# Patient Record
Sex: Female | Born: 1940 | Hispanic: No | Marital: Married | Smoking: Never smoker
Health system: Southern US, Community
[De-identification: ages and names within clinical notes are randomized; demographics above are authoritative.]

## PROBLEM LIST (undated history)

## (undated) DIAGNOSIS — Z9889 Other specified postprocedural states: Secondary | ICD-10-CM

## (undated) DIAGNOSIS — T4145XA Adverse effect of unspecified anesthetic, initial encounter: Secondary | ICD-10-CM

## (undated) DIAGNOSIS — K219 Gastro-esophageal reflux disease without esophagitis: Secondary | ICD-10-CM

## (undated) DIAGNOSIS — R112 Nausea with vomiting, unspecified: Secondary | ICD-10-CM

## (undated) DIAGNOSIS — M549 Dorsalgia, unspecified: Secondary | ICD-10-CM

## (undated) DIAGNOSIS — H269 Unspecified cataract: Secondary | ICD-10-CM

## (undated) DIAGNOSIS — G8929 Other chronic pain: Secondary | ICD-10-CM

## (undated) DIAGNOSIS — R51 Headache: Secondary | ICD-10-CM

## (undated) DIAGNOSIS — M1990 Unspecified osteoarthritis, unspecified site: Secondary | ICD-10-CM

## (undated) DIAGNOSIS — T8859XA Other complications of anesthesia, initial encounter: Secondary | ICD-10-CM

## (undated) HISTORY — PX: HAND SURGERY: SHX662

## (undated) HISTORY — PX: FOOT SURGERY: SHX648

## (undated) HISTORY — PX: FRACTURE SURGERY: SHX138

## (undated) HISTORY — PX: EYE SURGERY: SHX253

---

## 1998-03-15 ENCOUNTER — Emergency Department (HOSPITAL_COMMUNITY): Admission: EM | Admit: 1998-03-15 | Discharge: 1998-03-15 | Payer: Self-pay | Admitting: Emergency Medicine

## 1998-07-19 ENCOUNTER — Emergency Department (HOSPITAL_COMMUNITY): Admission: EM | Admit: 1998-07-19 | Discharge: 1998-07-19 | Payer: Self-pay | Admitting: Emergency Medicine

## 1999-08-19 ENCOUNTER — Encounter: Admission: RE | Admit: 1999-08-19 | Discharge: 1999-08-19 | Payer: Self-pay | Admitting: *Deleted

## 1999-08-19 ENCOUNTER — Encounter: Payer: Self-pay | Admitting: *Deleted

## 1999-11-17 ENCOUNTER — Encounter: Payer: Self-pay | Admitting: Cardiovascular Disease

## 1999-11-17 ENCOUNTER — Encounter: Admission: RE | Admit: 1999-11-17 | Discharge: 1999-11-17 | Payer: Self-pay | Admitting: Cardiovascular Disease

## 1999-12-28 ENCOUNTER — Emergency Department (HOSPITAL_COMMUNITY): Admission: EM | Admit: 1999-12-28 | Discharge: 1999-12-28 | Payer: Self-pay | Admitting: Emergency Medicine

## 2000-01-11 ENCOUNTER — Encounter (HOSPITAL_COMMUNITY): Admission: RE | Admit: 2000-01-11 | Discharge: 2000-04-10 | Payer: Self-pay | Admitting: Rheumatology

## 2000-04-05 ENCOUNTER — Encounter: Admission: RE | Admit: 2000-04-05 | Discharge: 2000-04-05 | Payer: Self-pay | Admitting: Rheumatology

## 2000-04-05 ENCOUNTER — Encounter: Payer: Self-pay | Admitting: Rheumatology

## 2000-04-17 ENCOUNTER — Encounter: Payer: Self-pay | Admitting: Emergency Medicine

## 2000-04-17 ENCOUNTER — Emergency Department (HOSPITAL_COMMUNITY): Admission: EM | Admit: 2000-04-17 | Discharge: 2000-04-17 | Payer: Self-pay | Admitting: Emergency Medicine

## 2000-05-21 ENCOUNTER — Encounter: Payer: Self-pay | Admitting: Emergency Medicine

## 2000-05-21 ENCOUNTER — Emergency Department (HOSPITAL_COMMUNITY): Admission: EM | Admit: 2000-05-21 | Discharge: 2000-05-21 | Payer: Self-pay | Admitting: Emergency Medicine

## 2000-07-26 ENCOUNTER — Emergency Department (HOSPITAL_COMMUNITY): Admission: EM | Admit: 2000-07-26 | Discharge: 2000-07-26 | Payer: Self-pay | Admitting: Emergency Medicine

## 2000-07-29 ENCOUNTER — Ambulatory Visit (HOSPITAL_COMMUNITY): Admission: RE | Admit: 2000-07-29 | Discharge: 2000-07-29 | Payer: Self-pay | Admitting: Ophthalmology

## 2000-09-29 ENCOUNTER — Ambulatory Visit (HOSPITAL_COMMUNITY): Admission: RE | Admit: 2000-09-29 | Discharge: 2000-09-29 | Payer: Self-pay | Admitting: Ophthalmology

## 2001-10-26 ENCOUNTER — Encounter: Admission: RE | Admit: 2001-10-26 | Discharge: 2001-10-26 | Payer: Self-pay | Admitting: Cardiovascular Disease

## 2001-10-26 ENCOUNTER — Encounter: Payer: Self-pay | Admitting: Cardiovascular Disease

## 2001-11-28 ENCOUNTER — Ambulatory Visit (HOSPITAL_COMMUNITY): Admission: RE | Admit: 2001-11-28 | Discharge: 2001-11-28 | Payer: Self-pay | Admitting: Ophthalmology

## 2001-11-30 ENCOUNTER — Emergency Department (HOSPITAL_COMMUNITY): Admission: EM | Admit: 2001-11-30 | Discharge: 2001-11-30 | Payer: Self-pay | Admitting: Emergency Medicine

## 2001-11-30 ENCOUNTER — Encounter: Payer: Self-pay | Admitting: Emergency Medicine

## 2001-12-22 ENCOUNTER — Encounter: Payer: Self-pay | Admitting: Cardiovascular Disease

## 2001-12-22 ENCOUNTER — Encounter: Admission: RE | Admit: 2001-12-22 | Discharge: 2001-12-22 | Payer: Self-pay | Admitting: Cardiovascular Disease

## 2002-06-01 ENCOUNTER — Other Ambulatory Visit: Admission: RE | Admit: 2002-06-01 | Discharge: 2002-06-01 | Payer: Self-pay | Admitting: Obstetrics and Gynecology

## 2002-06-19 ENCOUNTER — Ambulatory Visit (HOSPITAL_COMMUNITY): Admission: RE | Admit: 2002-06-19 | Discharge: 2002-06-19 | Payer: Self-pay | Admitting: Obstetrics and Gynecology

## 2002-06-19 ENCOUNTER — Encounter: Payer: Self-pay | Admitting: Obstetrics and Gynecology

## 2002-10-26 ENCOUNTER — Emergency Department (HOSPITAL_COMMUNITY): Admission: EM | Admit: 2002-10-26 | Discharge: 2002-10-26 | Payer: Self-pay | Admitting: Emergency Medicine

## 2003-04-29 ENCOUNTER — Encounter: Payer: Self-pay | Admitting: Cardiovascular Disease

## 2003-04-29 ENCOUNTER — Encounter: Admission: RE | Admit: 2003-04-29 | Discharge: 2003-04-29 | Payer: Self-pay | Admitting: Cardiovascular Disease

## 2003-05-30 ENCOUNTER — Encounter: Payer: Self-pay | Admitting: Cardiovascular Disease

## 2003-05-30 ENCOUNTER — Encounter: Admission: RE | Admit: 2003-05-30 | Discharge: 2003-05-30 | Payer: Self-pay | Admitting: Cardiovascular Disease

## 2003-07-22 ENCOUNTER — Encounter: Payer: Self-pay | Admitting: Cardiovascular Disease

## 2003-07-22 ENCOUNTER — Encounter: Admission: RE | Admit: 2003-07-22 | Discharge: 2003-07-22 | Payer: Self-pay | Admitting: Cardiovascular Disease

## 2004-04-28 ENCOUNTER — Inpatient Hospital Stay (HOSPITAL_COMMUNITY): Admission: EM | Admit: 2004-04-28 | Discharge: 2004-04-30 | Payer: Self-pay | Admitting: Emergency Medicine

## 2004-07-28 ENCOUNTER — Encounter: Admission: RE | Admit: 2004-07-28 | Discharge: 2004-08-26 | Payer: Self-pay | Admitting: Cardiovascular Disease

## 2004-10-29 ENCOUNTER — Inpatient Hospital Stay (HOSPITAL_COMMUNITY): Admission: EM | Admit: 2004-10-29 | Discharge: 2004-11-11 | Payer: Self-pay | Admitting: Emergency Medicine

## 2004-11-11 ENCOUNTER — Inpatient Hospital Stay: Admission: RE | Admit: 2004-11-11 | Discharge: 2004-11-26 | Payer: Self-pay | Admitting: Cardiovascular Disease

## 2006-01-27 ENCOUNTER — Inpatient Hospital Stay (HOSPITAL_COMMUNITY): Admission: RE | Admit: 2006-01-27 | Discharge: 2006-01-30 | Payer: Self-pay | Admitting: Orthopedic Surgery

## 2006-04-11 ENCOUNTER — Emergency Department (HOSPITAL_COMMUNITY): Admission: EM | Admit: 2006-04-11 | Discharge: 2006-04-11 | Payer: Self-pay | Admitting: Emergency Medicine

## 2007-04-15 ENCOUNTER — Emergency Department (HOSPITAL_COMMUNITY): Admission: EM | Admit: 2007-04-15 | Discharge: 2007-04-15 | Payer: Self-pay | Admitting: Emergency Medicine

## 2007-07-11 ENCOUNTER — Encounter: Admission: RE | Admit: 2007-07-11 | Discharge: 2007-07-11 | Payer: Self-pay | Admitting: Cardiovascular Disease

## 2007-09-20 ENCOUNTER — Ambulatory Visit (HOSPITAL_BASED_OUTPATIENT_CLINIC_OR_DEPARTMENT_OTHER): Admission: RE | Admit: 2007-09-20 | Discharge: 2007-09-20 | Payer: Self-pay | Admitting: Orthopedic Surgery

## 2007-12-28 ENCOUNTER — Inpatient Hospital Stay (HOSPITAL_COMMUNITY): Admission: EM | Admit: 2007-12-28 | Discharge: 2008-01-05 | Payer: Self-pay | Admitting: Emergency Medicine

## 2008-01-01 ENCOUNTER — Ambulatory Visit: Payer: Self-pay | Admitting: Vascular Surgery

## 2008-01-01 ENCOUNTER — Encounter (INDEPENDENT_AMBULATORY_CARE_PROVIDER_SITE_OTHER): Payer: Self-pay | Admitting: Cardiovascular Disease

## 2008-01-31 ENCOUNTER — Encounter: Admission: RE | Admit: 2008-01-31 | Discharge: 2008-01-31 | Payer: Self-pay | Admitting: Cardiovascular Disease

## 2008-04-11 ENCOUNTER — Encounter: Admission: RE | Admit: 2008-04-11 | Discharge: 2008-04-11 | Payer: Self-pay | Admitting: Cardiovascular Disease

## 2008-07-04 ENCOUNTER — Encounter: Admission: RE | Admit: 2008-07-04 | Discharge: 2008-08-12 | Payer: Self-pay | Admitting: Cardiovascular Disease

## 2009-01-02 ENCOUNTER — Encounter: Admission: RE | Admit: 2009-01-02 | Discharge: 2009-03-06 | Payer: Self-pay | Admitting: Cardiovascular Disease

## 2009-04-06 IMAGING — CR DG CHEST 2V
2 series · 2 of 2 positions shown · non-contrast
Comparison: 07/11/07.

CLINICAL DATA: Fever/left chest and flank pain.
 CHEST - 2 VIEW:

[w chest pa]
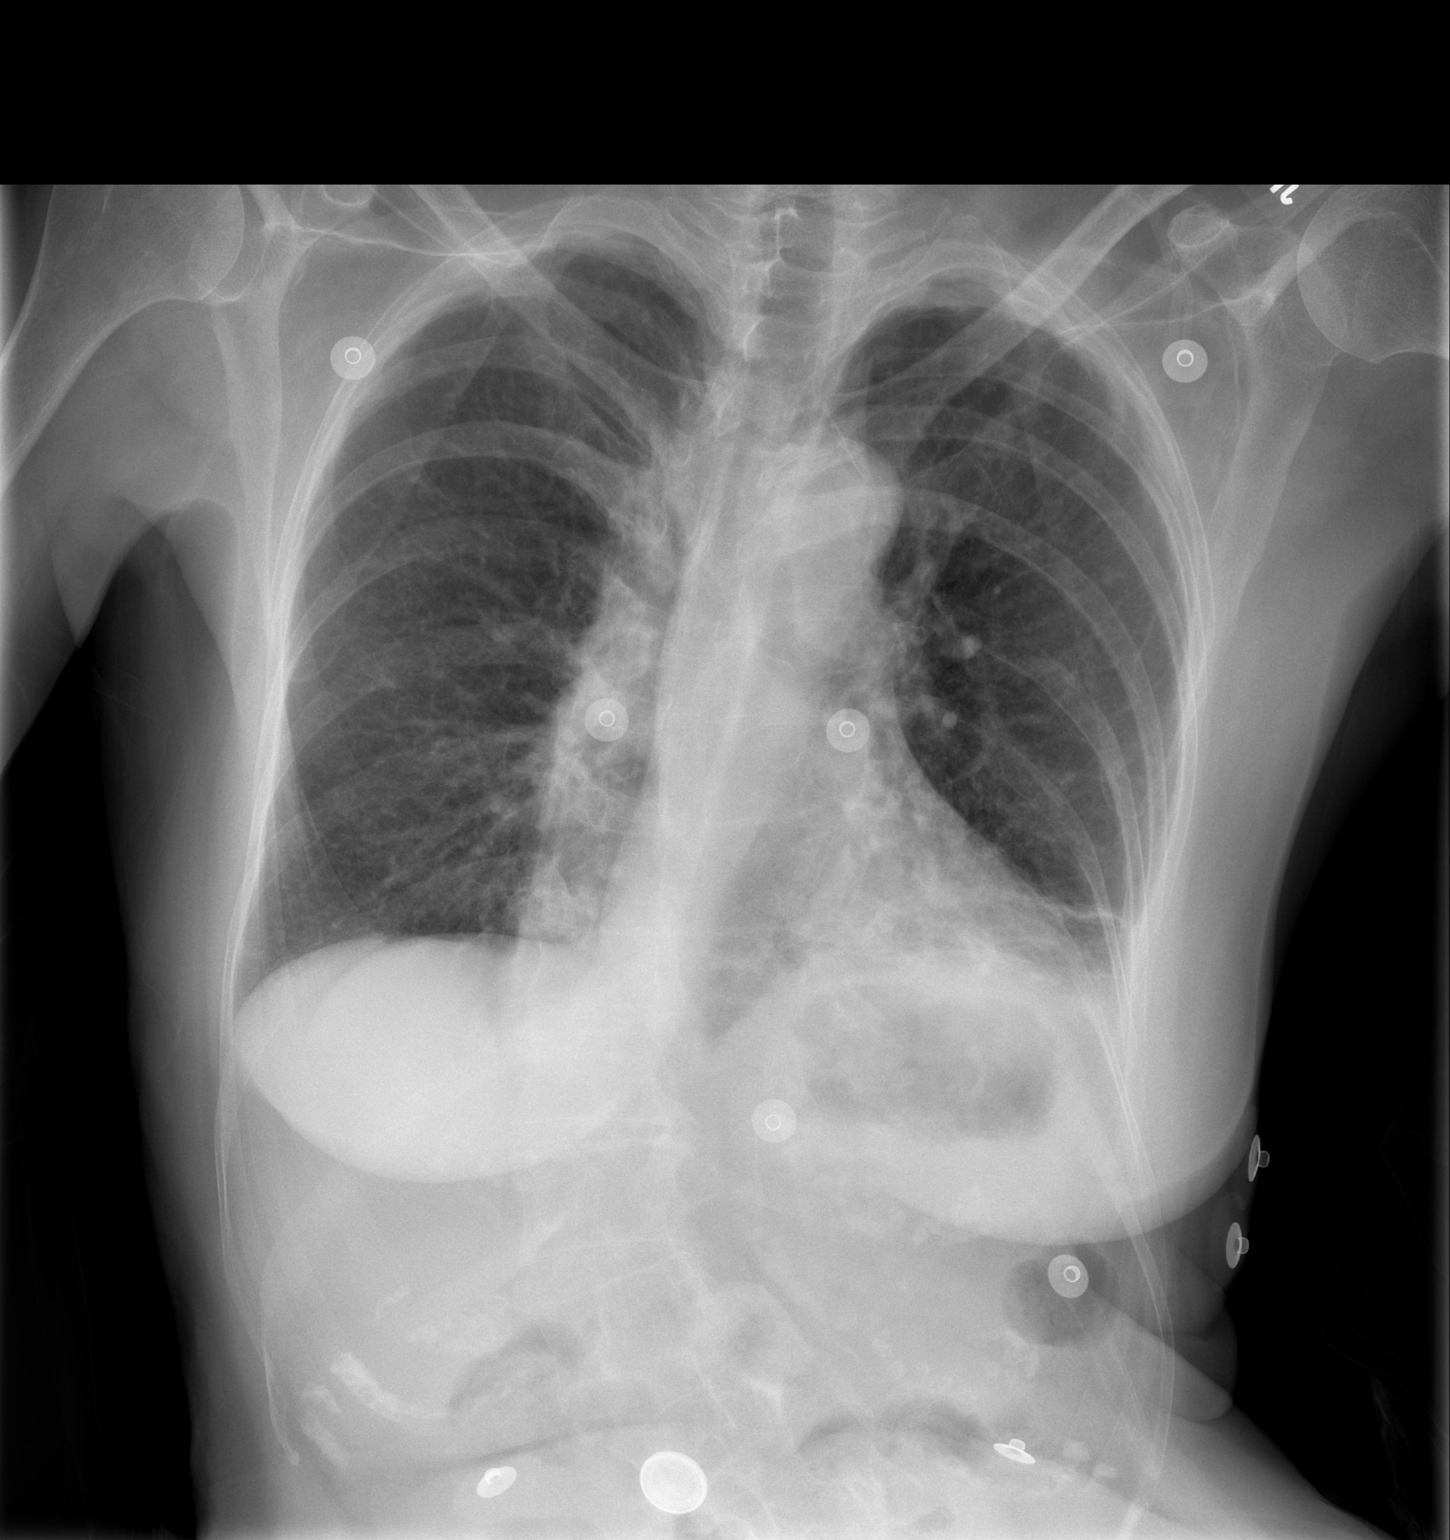

[w chest lat]
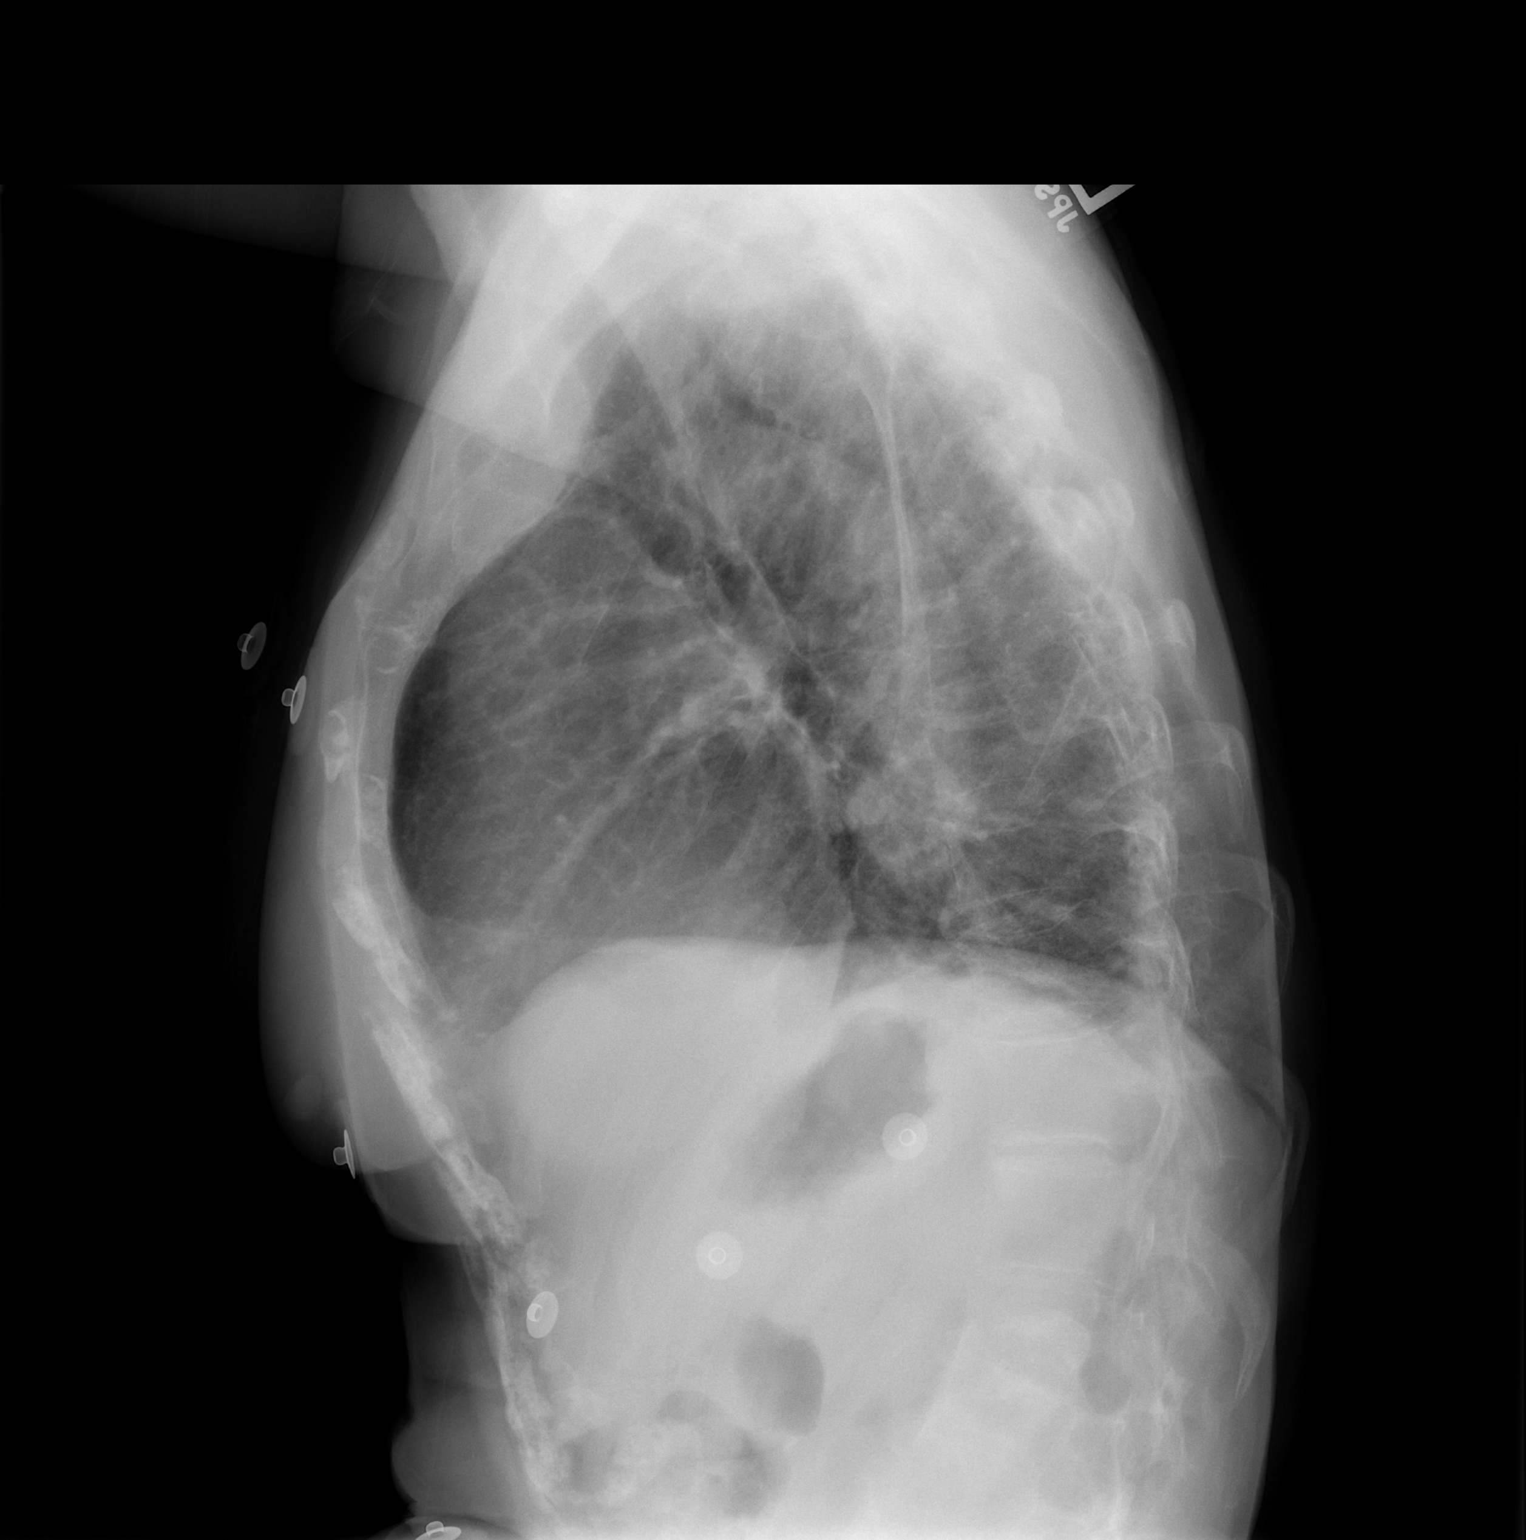

[2 of 2 positions shown; findings below may reference images not displayed]

FINDINGS: There is left lower lobe airspace disease, which is a new finding.  There is a probable small effusion.  The right lung is clear.
 There is biapical pleural thickening and underlying chronic lung disease.
 Bones are demineralized with marked dextroscoliosis.
IMPRESSION: New left lower lobe atelectasis or atelectatic pneumonia.

## 2009-05-22 ENCOUNTER — Inpatient Hospital Stay (HOSPITAL_COMMUNITY): Admission: RE | Admit: 2009-05-22 | Discharge: 2009-05-26 | Payer: Self-pay | Admitting: Orthopedic Surgery

## 2009-10-21 ENCOUNTER — Emergency Department (HOSPITAL_COMMUNITY): Admission: EM | Admit: 2009-10-21 | Discharge: 2009-10-21 | Payer: Self-pay | Admitting: Emergency Medicine

## 2009-11-13 ENCOUNTER — Inpatient Hospital Stay (HOSPITAL_COMMUNITY): Admission: RE | Admit: 2009-11-13 | Discharge: 2009-11-15 | Payer: Self-pay | Admitting: Orthopedic Surgery

## 2010-05-25 ENCOUNTER — Encounter: Admission: RE | Admit: 2010-05-25 | Discharge: 2010-05-25 | Payer: Self-pay | Admitting: Cardiovascular Disease

## 2010-11-21 ENCOUNTER — Encounter: Payer: Self-pay | Admitting: Neurology

## 2011-01-17 LAB — CBC
Hemoglobin: 10.6 g/dL — ABNORMAL LOW (ref 12.0–15.0)
Hemoglobin: 11.2 g/dL — ABNORMAL LOW (ref 12.0–15.0)
MCHC: 34.2 g/dL (ref 30.0–36.0)
MCHC: 34.4 g/dL (ref 30.0–36.0)
MCHC: 34.6 g/dL (ref 30.0–36.0)
MCV: 89.6 fL (ref 78.0–100.0)
Platelets: 191 10*3/uL (ref 150–400)
Platelets: 197 10*3/uL (ref 150–400)
RDW: 13.8 % (ref 11.5–15.5)
RDW: 13.9 % (ref 11.5–15.5)
RDW: 14 % (ref 11.5–15.5)

## 2011-01-17 LAB — BASIC METABOLIC PANEL
CO2: 26 mEq/L (ref 19–32)
CO2: 27 mEq/L (ref 19–32)
Calcium: 8.5 mg/dL (ref 8.4–10.5)
Calcium: 8.6 mg/dL (ref 8.4–10.5)
Chloride: 101 mEq/L (ref 96–112)
Creatinine, Ser: 0.79 mg/dL (ref 0.4–1.2)
Creatinine, Ser: 0.81 mg/dL (ref 0.4–1.2)
GFR calc non Af Amer: >60 mL/min (ref >60)
Glucose, Bld: 115 mg/dL — ABNORMAL HIGH (ref 70–99)
Glucose, Bld: 95 mg/dL (ref 70–99)
Sodium: 133 mEq/L — ABNORMAL LOW (ref 135–145)
Sodium: 136 mEq/L (ref 135–145)

## 2011-01-17 LAB — COMPREHENSIVE METABOLIC PANEL
ALT: 15 U/L (ref 0–35)
AST: 24 U/L (ref 0–37)
Calcium: 9.5 mg/dL (ref 8.4–10.5)
GFR calc Af Amer: >60 mL/min (ref >60)
Sodium: 135 mEq/L (ref 135–145)
Total Protein: 7.9 g/dL (ref 6.0–8.3)

## 2011-01-17 LAB — DIFFERENTIAL
Eosinophils Absolute: 0.2 10*3/uL (ref 0.0–0.7)
Eosinophils Relative: 2 % (ref 0–5)
Lymphocytes Relative: 30 % (ref 12–46)
Lymphs Abs: 2.9 10*3/uL (ref 0.7–4.0)
Monocytes Relative: 11 % (ref 3–12)

## 2011-02-07 LAB — CBC
HCT: 38.7 % (ref 36.0–46.0)
Hemoglobin: 13.4 g/dL (ref 12.0–15.0)
MCHC: 34.6 g/dL (ref 30.0–36.0)
MCV: 90.7 fL (ref 78.0–100.0)
Platelets: 210 10*3/uL (ref 150–400)
RBC: 4.27 MIL/uL (ref 3.87–5.11)
RDW: 13.9 % (ref 11.5–15.5)
WBC: 9.4 10*3/uL (ref 4.0–10.5)

## 2011-02-07 LAB — DIFFERENTIAL
Basophils Absolute: 0.2 10*3/uL — ABNORMAL HIGH (ref 0.0–0.1)
Lymphocytes Relative: 22 % (ref 12–46)
Monocytes Absolute: 0.8 10*3/uL (ref 0.1–1.0)
Monocytes Relative: 9 % (ref 3–12)
Neutro Abs: 6.3 10*3/uL (ref 1.7–7.7)

## 2011-02-07 LAB — COMPREHENSIVE METABOLIC PANEL
Albumin: 4.5 g/dL (ref 3.5–5.2)
BUN: 13 mg/dL (ref 6–23)
Creatinine, Ser: 0.71 mg/dL (ref 0.4–1.2)
Total Bilirubin: 1.3 mg/dL — ABNORMAL HIGH (ref 0.3–1.2)
Total Protein: 8.1 g/dL (ref 6.0–8.3)

## 2011-03-16 NOTE — H&P (Signed)
NAMEEVAN, OSBURN             ACCOUNT NO.:  [id number]   MEDICAL RECORD NO.:  [id number]          PATIENT TYPE:  INP   LOCATION:  4729                         FACILITY:  [facility]   PHYSICIAN:  Ricki Rodriguez, M.D.  DATE OF BIRTH:  02-15-1941   DATE OF ADMISSION:  12/28/2007  DATE OF DISCHARGE:                              HISTORY & PHYSICAL   CHIEF COMPLAINT:  Left-sided chest pain.   HISTORY OF PRESENT ILLNESS:  This 70 year old Asian American female had  a 3-day history of chest pain, along with mild fever without any  significant cough or sputum production.  The patient denied any history  of fall and there is no radiation of the chest pain or any sweating.   PAST MEDICAL HISTORY:  Negative for diabetes, hypertension, smoking,  alcohol intake, obesity or exercise.  The patient does not take any  street drugs.   FAMILY HISTORY:  Negative for any premature coronary artery disease.  Mom died in her 25s in 52.  Dad died in his 26s.  The patient has one  brother who died in 98 of a stroke; and one sister who died in 68 at  the age of 6, cause unknown.   CURRENT MEDICATIONS:  1. Metoprolol extended release 25 mg one daily.  2. Megestrol 40 mg one twice daily.  3. Prednisone 10 mg one daily as needed.  4. Vicodin 5/500 one to two times daily.  5. Flexeril 5 mg at bedtime.  6. Ferrous sulfate 325 mg daily.  7. Calcium 600 plus vitamin D daily.  8. Prilosec 20 mg one daily.   ALLERGIES:  NO KNOWN DRUG ALLERGIES.   PERSONAL HISTORY:  The patient is widowed.  Her husband died at age 75  of complications of aspiration pneumonia; he also had coronary artery  disease and stroke.  The patient has one son who is a 8 year old.   REVIEW OF SYSTEMS:  The patient has vision changes but does not wear  glasses.  No cataract surgery.  No hearing loss.  She does not wear  dentures.  No history of cough, asthma or pneumonia.  She does have  history of chest pain, rheumatoid  arthritis and headache following her  accident.  No history of bleeding from the bowel.  No history of stomach  ulcer, blood transfusion or hepatitis.  No history of kidney stones,  strokes, seizures or psychiatric admissions.   PHYSICAL EXAMINATION:  Pulse 90, respirations 20, blood pressure 110/60,  temperature 99, oxygen saturation 98%.  The patient is 5 feet 3 inches  tall; weighs approximately 85 pounds.  HEENT:  The patient is normocephalic, atraumatic; with mild right  temporal area tenderness.  She has lost her 2 front teeth.  Speech is  soft.  NECK:  No JVD.  Mild tenderness in the back of the neck.  LUNGS:  Few crackles at the left lung bases, otherwise clear.  HEART:  Normal S1 and S2, with a rated 2/6 systolic murmur.  ABDOMEN:  Soft and nontender.  EXTREMITIES:  No edema, cyanosis or clubbing.  She has bilateral  arthritic changes, more in  the left wrist.  Decreased mobility of the  right wrist post surgery.  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  CNS:  Cranial nerves grossly intact.  The patient has bilateral equal  grips, with weakness due to arthritis.   LABORATORY DATA:  Pending.   CHEST X-RAY:  Left lower lung pneumonia.   IMPRESSION:  1. Left lung pneumonia.  2. Rheumatoid arthritis.  3. Chronic headache.  4. Chronic back pain.   PLAN:  1. Admit the patient to regular floor.  2. Give IV antibiotics and analgesic for pain control.  3. Robitussin-DM as expectorant.      Ricki Rodriguez, M.D.  Electronically Signed     ASK/MEDQ  D:  12/28/2007  T:  12/29/2007  Job:  [id number]

## 2011-03-16 NOTE — Op Note (Signed)
NAMEBERLINDA, [name]             ACCOUNT NO.:  [id number]   MEDICAL RECORD NO.:  [id number]          PATIENT TYPE:  INP   LOCATION:  5006                         FACILITY:  [facility]   PHYSICIAN:  Dionne Ano. Gramig, M.D.DATE OF BIRTH:  06/05/1941   DATE OF PROCEDURE:  05/22/2009  DATE OF DISCHARGE:                               OPERATIVE REPORT   PREOPERATIVE DIAGNOSES:  1. Rheumatoid deformity with synovitis and end-stage destruction about      the thumb, metacarpophalangeal and carpometacarpal joints with      chronic deformity and inability to perform normal and customary      activities.  2. Rheumatoid arthritis with wrist deformity and failure to return to      quiescence despite numerous conservative measures including      injections.  She has synovitis and end-stage distal radioulnar      joint degenerative changes.   SURGICAL PROCEDURE PERFORMED:  1. Darrach procedure (resection of distal ulna, left wrist).  2. The extensor carpi ulnaris  tendon transfer to the left ulna.  This      was a tendon transfer at the wrist level.  3. Pronator quadratus tendon transfer to the ulna.  This was tendon      transfer at the wrist level.  4. The metacarpophalangeal fusion, left thumb at the      metacarpophalangeal joint.  5. Stress radiography.  6. Arthroplasty carpometacarpal joint, basilar thumb (removal of the      trapezium at the basilar thumb joint level) left basilar thumb.  7. Abductor pollicis longus, digastric portion tendon transfer to the      first metacarpal flexor carpi radialis and back upon themselves      with multiple figure-of-eight throws (Zancolli arthroplasty).  8. Abductor pollicis longus one-third proper portion tendon transfer      to the flexor carpi radialis, abductor pollicis longus proper and      back upon itself with multiple figure-of-eight throws (Weilby      tendon transfer) left basilar thumb joint.  9. Abductor pollicis longus tenodesis  (shortening of the wrist      extensor at the wrist-forearm level to prevent dorsolateral escape      of the thumb metacarpal).   SURGEON:  Dionne Ano. [place], Md   ASSISTANT:  Karie Chimera, PA-C   COMPLICATIONS:  None.   ANESTHESIA:  General.   INDICATIONS FOR PROCEDURE:  This patient presents for chronic rheumatoid  arthritic deformity.  She understands the risks and benefits of the  surgery.  She is failed multiple injections, splinting, pharmacologics  and other measures with all preop discussion performed and conservative  measures failing.  She presents to the above-mentioned procedure.   OPERATIVE PROCEDURE IN DETAIL:  The patient was seen by myself and  Anesthesia, taken to the operating suite and underwent a smooth  induction of general anesthesia.  Time-out was called, tourniquet  applied, preop checklist accomplished and of course, the arm had been  marked preoperatively.  Once this was done, she is prepped and draped in  usual sterile fashion with Betadine scrub and paint about the  left upper  extremity.  Preoperative antibiotics were in her system through an IV  route and the operation commenced with a dorsal ulnar incision.  Dissection was carried down, ECU was identified, fifth and fourth dorsal  compartments were identified and fifth dorsal compartment had chronic  tenosynovitic reaction about it.  I opened the capsular joint, resected  the distal ulna with oscillating saw.  I then beveled this down, so that  there will be no sharp edges.  Following this, I then performed drill  hole creation dorsally.  I harvested one-half slip of the ECU and placed  it through the end of the bone, up through drill hole and back upon  itself with multiple figure-of-eight throws and tied down against the  ECU proper.  She tolerated this well.  This completed the ECU tendon  transfer after Darrach procedure.   Once this was done, I then performed a pronator quadratus tendon   transfer to the dorsal periosteal tissue of the ulna and imbricated the  soft tissue.  This completed the pronator quadratus tendon transfer.  I  then performed a reefing procedure and oversewned the capsule  investments.  I took care to avoid the ulnar neurovascular bundles and  the fourth dorsal compartment and fifth dorsal compartment had chronic  tendinosis and poor competency.   Following this, I turned attention towards thumb.  A longitudinal  incision was made.  Dissection was carried down.  EPB and the sheath  over the MP joint was incised.  Capsule was then incised, joint was open  in cup-and-cone, collateral ligaments released, neurovascular bundles  protected and were out of harm's way.  Cup-and-cone preparation of the  joint with rongeur was accomplished followed by placement of 24-mm  Acutrak screw.  This was done in direct vision as well as with the aid  of fluoroscopy.  Coaptation of cancellous surfaces under compression was  accomplished without difficulty and the patient tolerated this well.  Following this, I then turned attention towards [facility] joint of thumb.   CMC joint was accessed.  Radial artery was very close to the area, was  chronic horrific deformity and I thus mobilized the radial artery  without difficulty as well as superficial radial nerve.  I then created  an interval between the capsule and opened this up.  Trapezium was  excised in piecemeal.  FCR underwent tenolysis and tenosynovectomy and  drill hole was created, dorsal to palmar exiting intra-articularly in  line with the palmar beak ligament.  Following this, I irrigated  copiously.  I then performed a harvesting of the APL digastric and one-  third proper portion of the APL proper tendon.   This completed the arthroplasty portion of procedure.  I then performed  tendon transfer of the APL digastric portion.  This was placed through  drill hole, dorsal to palmar around the FCR twice back upon itself  and  sutured down with FiberWire suture.  The patient tolerated this well.  Following this, I then performed irrigation and additional suture into  the periosteal tissue.  This completed the Zancolli tendon transfer.   Following this, I then performed APL one-third proper portion tendon  transfer to the FCR.  This was placed back on the APL proper in multiple  figure-of-eight throws were then placed securing the tendon transfer.  This completed the [facility] tendon transfer.  Thus, CMC arthroplasty and 2  tendon transfers was accomplished.  I then placed Gelfoam in the wound,  irrigated copiously,  and closed the wound with complicated capsular  stitch with APL tenodesis.  This was shortening of the wrist extensor at  the wrist-forearm level to prevent dorsolateral escape.  The patient  tolerated this well and there were no complicating features.  Following  this, tourniquet was deflated.  Hemostasis obtained with bipolar  cautery.  Capsules closed nicely and I was placed this.  Once this was  done, I irrigated without difficulty and closed wound with interrupted  Prolene.  The patient tolerated this nicely.  She was placed a long-arm  splint with forearm neutral, elbow at 90, wrist in neutral, and thumb in  a healthy position.  I was very pleased with the operative procedure.  This hopefully will give her gait gain, so she certainly does not have a  normal wrist due to  the tremendous deformity, but hopefully if we can increase her function,  this was our main goal.  She will be admitted for IV antibiotics, which  will begin in the recovery room.  Pain management according to her needs  and general postop observatory care.  All questions have been encouraged  answered.      Dionne Ano. Amanda Pea, M.D.  Electronically Signed     WMG/MEDQ  D:  05/22/2009  T:  05/23/2009  Job:  [id number]   cc:   Ricki Rodriguez, M.D.  Richard Watt Climes

## 2011-03-16 NOTE — Discharge Summary (Signed)
[name], [name]             ACCOUNT NO.:  [id number]   MEDICAL RECORD NO.:  [id number]          PATIENT TYPE:  INP   LOCATION:  4729                         FACILITY:  [facility]   PHYSICIAN:  Ricki Rodriguez, M.D.  DATE OF BIRTH:  26-Mar-1941   DATE OF ADMISSION:  12/28/2007  DATE OF DISCHARGE:  01/05/2008                               DISCHARGE SUMMARY   DISCHARGE DIAGNOSES:  1. Pulmonary embolism.  2. Bilateral deep venous thrombosis.  3. Possible pneumonia.  4. Rheumatoid arthritis.  5. Protein calorie malnutrition, mild.   DISCHARGE MEDICATIONS:  1. Flexeril 5 mg at bedtime.  2. Megace 40 mg one twice daily.  3. Toprol XL 25 mg one daily.  4. Vicodin 5/500 one twice daily as needed for pain.  5. Coumadin 5 mg one in the evening.  6. Ensure 8 ounces daily.  7. Albuterol metered-dose inhaler 2 puffs four times daily as needed.  8. Fexofenadine 180 mg one daily.  9. Prednisone 10 mg half a tablet daily.  10.Omeprazole 20 mg one daily.   ACTIVITY:  The patient to increase activity slowly and must use a walker  for ambulation.   DISCHARGE DIET:  Low-sodium, heart-healthy diet with no green, leafy  vegetable or broccoli.   SPECIAL INSTRUCTION:  The patient to stop any activity that causes chest  pain, shortness of breath, dizziness, sweating or excessive weakness.   FOLLOWUP:  Dr. Orpah Cobb in 1 week.  The patient to call [phone],  for appointment and get INR done also in 1 week.   HISTORY OF PRESENT ILLNESS:  This 70 year old, Asian-American female  presented with chest pain without fever or cough x3 days' duration.  The  patient had an x-ray done in the emergency room that appeared to show  left lung pneumonia.   PHYSICAL EXAMINATION:  VITAL SIGNS:  Pulse 110, respirations 16,  temperature 100.8.  Subsequent temperature 98.1.  Oxygen saturation 98%.  HEENT:  The patient is normocephalic, atraumatic with chronic right-  sided, frontoparietal tenderness following  injury caused by fall.  Her  eyes are brown.  Pupils reacting to light.  Extraocular movement intact.  NECK:  No JVD, no carotid bruit.  LUNGS:  Few crackles at both bases, more on the left side.  HEART:  Normal S1, S2 without S3 gallop.  ABDOMEN:  Soft and nontender.  EXTREMITIES:  No edema, cyanosis, clubbing.  Significant arthritic  changes in both upper and lower extremities.  NEUROLOGIC:  Cranial nerves grossly intact.  The patient moves all four  extremities.   LABORATORY DATA AND X-RAY FINDINGS:  WBC count of 12,600 with hemoglobin  11.7, hematocrit 34.7, platelet count to 264,000.  Sodium 132, potassium  4.4, chloride 102, glucose 106.  Cardiac enzymes negative.   X-ray of the chest suggestive of left lung pneumonia.  CT of the chest  revealed bilateral pulmonary emboli with a focal consolidation of the  left lower lobe and a small left pleural effusion.  EKG showed sinus  tachycardia with short PR.   HOSPITAL COURSE:  The patient was admitted to telemetry unit.  She was  started on IV antibiotic.  Her condition marginally improved in 24  hours.  She underwent CT of the chest that showed pulmonary emboli.  Evaluation of the lower extremities also showed deep venous thrombosis.  Hence, she was started on IV heparin and Coumadin.  Her condition  gradually improved over 72-96 hours.  The Coumadin level was 2.2 INR  today and the patient started ambulating as of yesterday.  Hence, she  was discharged home in satisfactory condition with a followup by me in 1  week and adjusting her medication doses prior to discharge.      Ricki Rodriguez, M.D.  Electronically Signed     ASK/MEDQ  D:  01/05/2008  T:  01/06/2008  Job:  [id number]

## 2011-03-16 NOTE — Discharge Summary (Signed)
NAMEKEILIN, [name]             ACCOUNT NO.:  [id number]   MEDICAL RECORD NO.:  [id number]          PATIENT TYPE:  INP   LOCATION:  5006                         FACILITY:  [facility]   PHYSICIAN:  Dionne Ano. Gramig, M.D.DATE OF BIRTH:  01-20-1941   DATE OF ADMISSION:  05/22/2009  DATE OF DISCHARGE:                               DISCHARGE SUMMARY   HISTORY OF PRESENT ILLNESS:  [name] is a very pleasant 70 year old  female who underwent rheumatoid reconstruction of her left hand.  She  was taken to the operative theater on May 22, 2009 and underwent there  procedure with tendon transfer and thumb reconstruction.  She was  admitted postoperatively.  She has a history of prior right upper  extremity reconstruction secondary to rheumatoid arthritis and has done  well.  The patient was admitted postoperatively and placed on IV  antibiotics.  General pain management was discontinued in terms of the  IV antibiotics on May 24, 2009 and weaned from IV pain medicine  throughout the course of her stay.  She was seen by therapy.  She  requires a walker for ambulatory purposes and had great difficulty given  the long arm cast that was present.  We discussed with her and her  family options.  Given her independent living status preoperatively and  the difficulty with independent living status postoperatively including  ability to make her own meals and ambulate with assistance, we discussed  with the family options for treatment.  At the present time, they  desired to proceed with skilled nursing facility for 2-6 weeks depending  on her progress.  I have discussed this with her son at great length who  is at cell phone [phone] or work [phone].  At the present  time, we are going to plan to advance her to skilled nursing facility.   PHYSICAL EXAMINATION:  LUNGS:  On May 25, 2009 showed clear lung  fields, although she has a history of PE.  She has had no complicating  features today  postoperatively.  Clear lung fields and looks excellent  and is ambulatory.  ABDOMEN:  Nontender.  HEART:  Regular rate.  EXTREMITIES:  Lower extremity examination is benign without signs of  DVT, infection, or dystrophy.  Left upper extremity shows good early  range of motion.  She had some attritional wear to the extensor  apparatus and difficulty with full extension of the extensor apparatus  but index finger and thumb are moving very well.  The patient is stable  at this juncture in my opinion.   DISCHARGE MEDICATIONS:  She is going to be discharged to the nursing  care facility on her regular medicines which included metoprolol 25 mg 1  p.o. b.i.d. and Megestrol 40 mg 1 p.o. b.i.d.  In addition to this, her  tobramycin drops 4 times daily will be continued.  We will plan for  continued omeprazole 20 mg 1 p.o. daily and fexofenadine 180 mg daily.  We will discharge her on Percocet 5 mg one to two q.4-6 h. p.r.n. pain  p.o. and Robaxin 500 mg 1 p.o. q.6-8 h. p.r.n.  spasm.   FOLLOWUP:  I want to see her back in my office in 10-14 days and asked  her to notify me should any problems occur.   FINAL DIAGNOSES:  1. Status post major rheumatoid reconstruction, left hand including      Darrach procedure as well as thumb reconstruction.  2. Rheumatoid arthritis.  3. History of hypertension.  4. Inability to assume independent living status postoperatively due      to the immobilization and her ambulatory care needs.   It has been an absolute pleasure to see her with abrupt chest pain in  her continued postoperative care.  I have discussed with [name] and  the family all the issues and they are aware of the do's and don't's  etc.      Dionne Ano. Amanda Pea, M.D.  Electronically Signed     WMG/MEDQ  D:  05/25/2009  T:  05/25/2009  Job:  [id number]

## 2011-03-16 NOTE — Op Note (Signed)
[name], [name]             ACCOUNT NO.:  [id number]   MEDICAL RECORD NO.:  [id number]          PATIENT TYPE:  AMB   LOCATION:  DSC                          FACILITY:  [facility]   PHYSICIAN:  Leonides Grills, M.D.     DATE OF BIRTH:  [birth year]   DATE OF PROCEDURE:  09/20/2007  DATE OF DISCHARGE:  09/20/2007                               OPERATIVE REPORT   PREOPERATIVE DIAGNOSIS:  1. Right great toe interphalangeal joint sesamoiditis.  2. Right fifth hammertoe.   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  2. Right fifth hammertoe.   OPERATIONS:  1. Right great toe interphalangeal joint sesamoid excision.  2. 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   ANESTHESIA:  General.   SURGEON:  Leonides Grills, MD   ASSISTANT:  Evlyn Kanner, P.A.   ESTIMATED BLOOD LOSS:  Minimal.   TOURNIQUET:  None.   COMPLICATIONS:  None.   DISPOSITION:  Stable to PR.   INDICATIONS:  This is a 70 year old female, who has had longstanding  plantar, right great toe pain with a persisting callus that was  resistant to conservative management as well as a cock-up right fifth  hammertoe that was rubbing in her shoes.  She was explained her options  for the fifth toe, being reconstruction versus amputation.  She wished  to proceed with amputation.  She was consented for the above procedure.  All risks which include infection, nerve or vessel injury, persistent  pain, worse pain, prolonged recovery, possible cock-up deformity through  the IP joint of the great toe were all explained.  Questions were  encouraged and answered.   OPERATION:  The patient brought to operating room and placed in supine  position after adequate general endotracheal tube anesthesia was  administered as well as Ancef gram IV piggyback.  The right lower  extremity was prepped and draped in a sterile manner over proximally  placed thigh tourniquet.  Limb was gravity  exsanguinated and the  tourniquet elevated 290 mmHg.  A longitudinal incision was made on the  plantar aspect of the right fifth toe IP joint.  Dissection was carried  down through skin.  Hemostasis was obtained.  EHL were identified and  retracted out of harm's way.  Sesamoid was then identified and carefully  dissected out.  Once this was dissected out and removed, area was  copiously irrigated with normal saline.  Skin was closed with 4-0 nylon  stitch.  We then made a racket shaped incision around the base of the  right fifth toe.  Dissection was carried down through skin.  Hemostasis  was obtained.  Toe was then disarticulated at the MTP joint.  Tourniquet  deflated.  Hemostasis was obtained.  Subcu was closed with 3-0 Vicryl.  Skin was closed with 4-0 nylon.  Sterile dressing was applied.  Hard  sole shoe was applied.  Patient stable to PR.      Leonides Grills, M.D.  Electronically Signed     PB/MEDQ  D:  09/20/2007  T:  09/20/2007  Job:  [id number]

## 2011-03-19 NOTE — Discharge Summary (Signed)
NAMEIONIA, SCHEY             ACCOUNT NO.:  [id number]   MEDICAL RECORD NO.:  [id number]          PATIENT TYPE:  INP   LOCATION:  3003                         FACILITY:  [facility]   PHYSICIAN:  Dionne Ano. Gramig, M.D.DATE OF BIRTH:  Sep 09, 1941   DATE OF ADMISSION:  01/27/2006  DATE OF DISCHARGE:  01/30/2006                                 DISCHARGE SUMMARY   ADMISSION DIAGNOSES:  1.  Chronic right wrist pain secondary to rheumatoid arthritis.  2.  History of stable angina.  3.  History of anemia.  4.  History of malnutrition.   DISCHARGE DIAGNOSES:  1.  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  2.  History of stable angina.  3.  History of anemia.  4.  History of malnutrition.   CONSULTATIONS:  Nutrition/dietary.   PROCEDURES:  1.  Total wrist fusion, right wrist, with Symphony bone grafting and      autograft bone grafting.  2.  Posterior interosseous neurectomy, left right hand.  3.  Extensor pollicis longus transposition, right hand.  4.  Stress x-ray.  5.  Day-of-procedure distal ulnar resection.  6.  Extensor carpi ulnaris to ulna 1/2 slip tendon transfer.   HISTORY OF PRESENT ILLNESS:  [name] is an extremely pleasant 70 year old  female who has a significant history of rheumatoid arthritis and, despite a  full conservative treatment regimen longstanding, she unfortunately persists  to have chronic pain which is disabling, and at this juncture she presents  for surgical intervention to the right upper extremity including a total  wrist fusion.  Preoperative medical issues, including angina and anemia,  were discussed with her primary care physician, Dr. Algie Coffer, and she was  found stable to undergo surgical intervention.  Her preoperative laboratory  data showed a hemoglobin and hematocrit of 11.5 and 32.6, respectively,  otherwise negative.  Preoperative EKG showed no gross changes to prior EKGs  and showed that she had sinus rhythm  with a short PR interval.   HOSPITAL COURSE:  [name] was admitted on January 27, 2006, and underwent  the above procedure without difficulty.  She tolerated this well. There were  no complications.  Please see the operative report for full details. She was  admitted postoperatively with standard pain management and an antibiotic  regimen.  Given her prior history of malnutrition and anemia as well as her  vegetarian status, nutrition was consulted to follow and ensure that she  obtained adequate sustenance.   On postoperative day #1, she was doing well overall.  Her pain was  relatively controlled.  She denied nausea, vomiting, fever, chills, or  shortness of breath.  Vital signs were stable.  She was afebrile.  O2  saturation on room air was 96%.  Hemoglobin and hematocrit were noted to be  10.1 and 29.7, respectively.  Glucose 123, albumin 3.3.  Her splint was  clean, dry, and intact to the upper extremity.  Bandage was removed without  difficulty.  Sensation and refill were intact.  Minimal edema was noted.  Decision for continued IV antibiotics was made given her rheumatoid status  and the increased propensity for infection in this patient population.  Decision was made to discontinue IV morphine and being a Dilaudid PCA given  her English was somewhat limited, and her ability to express her desires for  more pain medication is also somewhat limited.  She tolerated the PCA very  well, having no difficulties.  OT and PT were consulted and evaluated her.  She was noted to have minimal help at home and would be a good candidate for  home health care given her overall health status and recent surgery.   On postoperative day #2, she was doing well; she was afebrile.  She remained  stable to the upper extremity.  Laboratory data remained stable, and overall  she looked good.   On postoperative day #3, she was tolerating p.o.'s well.  Ensure was placed  at bedside as a supplement to her  diet.  Vital signs stable.  She was  afebrile.  Her upper extremity revealed that she was neurovascularly intact.  Her splint was clean, dry, and intact.  Lungs were clear to auscultation.  Abdomen was soft, nontender.  Heart was regular rate and rhythm.  Decision  was made to discharge her home.  Care management had seen and evaluated the  patient and had [facility] set up for her at her discharge.   ASSESSMENT AND FINAL DIAGNOSES:  Please see Discharge Diagnoses and Plan.   CONDITION ON DISCHARGE:  Improved.   DIET:  She takes a vegetarian diet, but will utilize supplements as needed  as discussed with dietary.   ACTIVITY:  She will keep her dressing clean, dry, and intact to the upper  extremity, move her fingers frequently and elevate frequently.   DISCHARGE MEDICATIONS:  1.  Peri-Colace over-the-counter 100 mg 1 p.o. twice daily.  2.  Percocet 5/325 one to two p.o. q. 4-6 h p.r.n. pain.  3.  In addition, on day of discharge she was given Dulcolax suppository and      use milk of magnesia p.r.n.   She will return to the clinic and follow up with Dr. Amanda Pea April 9 at 11  a.m.      Karie Chimera, P.A.-C.    ______________________________  Dionne Ano. Amanda Pea, M.D.    BB/MEDQ  D:  03/10/2006  T:  03/11/2006  Job:  [id number]

## 2011-03-19 NOTE — Op Note (Signed)
[place]. [facility]  Patient:    [name], [name]                    MRN: [id number] Proc. Date: 09/29/00 Adm. Date:  [id number] Attending:  Karenann Cai                           Operative Report  PREOPERATIVE DIAGNOSIS:  Immature cataract, left eye.  POSTOPERATIVE DIAGNOSIS:  Immature cataract, left eye.  PROCEDURE:  Extracapsular cataract extraction with intraocular lens implantation.  ANESTHESIA:  Local using Xylocaine 2% with Marcaine 0.75% and Wydase.  JUSTIFICATION FOR PROCEDURE:  This is a 70 year old lady, who underwent a cataract extraction from the right eye with good visual results several months ago.  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  She has a visual acuity in the right eye best corrected at 20/50, on the left eye 20/60.  Cataract extraction with intraocular lens implantation was recommended.  She was admitted at this time for that purpose.  DESCRIPTION OF PROCEDURE:  The patient was taken to the operating room, where, under the influence of IV sedation, the Van Lint akinesia and retrobulbar anesthesia was given.  The patient was prepped and draped in the usual manner. The lid speculum was inserted under the upper and lower lid of the left eye and a 4-0 silk traction suture was passed through the belly of the superior rectus muscle for traction.  A fornix-based conjunctival flap was turned and hemostasis achieved by using cautery.  An incision made in the sclera approximately 1 mm posterior to the limbus.  This incision was dissected down into clear cornea using crescent blade.  A side port incision was made at the 1:30 oclock position and Occucoat was injected into the eye through the side port incision.  The anterior chamber was then entered through the corneoscleral tunnel incision at the 11:30 oclock position.  An anterior capsulotomy was done using  bent 25-gauge needle with a moderate amount of difficulty.  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  After difficulty manipulating the iris and the nucleus and getting the phaco hand piece into the eye, the decision was made to convert the procedure to an extracapsular procedure.  Therefore, the wound was extended first toward the left and then toward the right.  Patient had a single Vicryl suture across each ______ incision ______ toward the left and toward the right.  The nucleus was then manually expressed from the eye.  The two ______ stay sutures were closed and a third one was placed at the 12 oclock position. The ______ handpiece was fastened in the eye and the ______ was aspirated. During the cortical clean-up, it was noticed that we had a tear in the posterior capsule.  Vitreous presented itself in the wound.  An anterior vitrectomy was done.  An anterior chamber lens was then seated across the iris into the chamber angled without difficulty.  The anterior chamber was reformed and pupil was constricted using Miochol.  The corneoscleral wound was then closed by using a combination of interrupted sutures of 8-0 Vicryl and 10-0 nylon.  After ascertaining that the wound was airtight and watertight, the conjunctiva was closed using thermocautery.   One cc of Celestone, 0.5 cc of gentamicin were injected subconjunctivally.  Maxitrol ophthalmic ointment and atropine ointment were applied along with the patch  and Fox shield.  The patient tolerated the procedure well and was discharged to the postanesthesia recovery room in satisfactory condition.  She is instructed to refrain from any strenuous activity, such as stooping, bending, and lifting.  She is to see me in the office tomorrow for further evaluation.  DISCHARGE DIAGNOSIS:  Immature cataract - left eye. DD:  09/29/00 TD:  09/29/00 Job: [id number]

## 2011-03-19 NOTE — Discharge Summary (Signed)
[name], [name]             ACCOUNT NO.:  [id number]   MEDICAL RECORD NO.:  [id number]          PATIENT TYPE:  ORB   LOCATION:  4503                         FACILITY:  [facility]   PHYSICIAN:  Ricki Rodriguez, M.D.  DATE OF BIRTH:  [birth year]   DATE OF ADMISSION:  11/11/2004  DATE OF DISCHARGE:  11/26/2004                                 DISCHARGE SUMMARY   PRINCIPAL DIAGNOSES:  1.  Rehabilitation process.  2.  Fracture of the maxillary zygomatic bone.  3.  Unspecified fall.  4.  Extended home.  5.  Postsurgical status.  6.  Presently very mild enterocele.  7.  Urinary tract infection.  8.  Rheumatoid arthritis.  9.  Anemia.  10. Abnormal gait.  11. Long term use of steroids.   DISCHARGE MEDICATIONS:  1.  Folic acid 1 mg daily.  2.  Colace 100 mg daily.  3.  Megace 40 mg 2 twice daily.  4.  Allergies 180 mg 1 daily,  5.  Ferrous sulfate 325 mg 1 daily.  6.  Calcium 500 plus vitamin D 1 daily.  7.  Prednisone 10 mg daily.  8.  Toprol XL 25 mg 1 daily.  9.  Ensure 8 ounces once or twice daily.  10. Vicodin 5/500 1 twice daily as needed.  11. Naprosyn 500 mg 1 daily as needed.  12. Pain management; patient is to use Tylenol as directed.   ACTIVITY:  The patient most walk with a walker only.   DIET:  The discharge diet is a low-fat, regular salt diet.   SPECIAL INSTRUCTIONS:  The patient is discontinue the splint in one week.   FOLLOW UP:  The patient is to follow up with Dr. Ricki Rodriguez in two  weeks. The patient is to call [phone] for an appointment.   DISCHARGE CONDITION:  The condition on discharge is improved.   HISTORY:  This 70 year old Asian female fell at home without loss of  consciousness and had a nosebleed near the bathroom following a complaint of  complete body aches.  The patient denies any chest pain, loss of vision and  heart palpitations.  She sustained a fracture of the right-sided zygomatic  both maxillary bones requiring surgical  correction by Dr. Graylon [name] on  October 31, 2004.   PHYSICAL EXAMINATION:  VITAL SIGNS:  On physical examination the pulse is  94, respiratory rate 16, blood pressure 100/60, temperature 98, and oxygen  saturation 100% on room air.  The patient is 5 feet 3 inches tall and weighs  86 pounds.  HEENT:  The patient is normocephalic with a mild swelling of the left  maxilla, in the zygomatic area with two healing scars from surgery.  Speech  is soft.  The patient has lost two front teeth.  NECK:  No JVD.  Mild tenderness in the back of the neck.  LUNGS:  The lungs are clear bilaterally.  HEART:  The heart has normal S1 and S2.  ABDOMEN:  The abdomen is soft and nontender.  EXTREMITIES:  There are arthritic changes of the hands and feet with  swelling  at the right ankle area along with tenderness over the right  shoulder with limited abduction to 45 degrees,   LABORATORY DATA:  The laboratory data revealed a hemoglobin of 11.3,  hematocrit of 33.1and borderline high WBC count if 11,800; and, a subsequent  level was 8,400.  The platelet count was elevated at 572,000 and subsequent  platelet was about 258,000.  Her electrolytes were near normal with a  slightly low sodium of 132 and subsequent sodium was 138 mEq/liter.  Her  iron level was normal at 83.  Folate level was over 24.  Urinalysis showed  positive nitrites with moderate leukocyte esterase and over 100,000 colonies  of E coli.   HOSPITAL COURSE:  The patient was admitted to the rehab unit.  She had  inpatient consult, physical therapy and occupational therapy with  significant improvement in her condition over two weeks.  On November 12, 2004 her Foley catheter was removed.  She had no symptoms of a urinary tract  infection and was placed on Cipro 250 mg twice daily for five days.   On November 26, 2004 she was discharged home in satisfactory condition with  follow up by me in two weeks.      ASK/MEDQ  D:  02/24/2005  T:   02/25/2005  Job:  [id number]

## 2011-03-19 NOTE — Op Note (Signed)
[place]. [facility]  Patient:    BRIANNIE, [name]                    MRN: [id number] Adm. Date:  [id number] Disc. Date: [id number] Attending:  Karenann Cai                           Operative Report  PREOPERATIVE DIAGNOSIS:  Immature cataract, right eye.  POSTOPERATIVE DIAGNOSIS:  Immature cataract, right eye.  OPERATION:  Kelman phacoemulsification and cataract, right eye.  ANESTHESIA:  Local using Xylocaine 2% with Marcaine 0.75% with Wydase.  JUSTIFICATION FOR PROCEDURE:  This is a 70 year old [place] lady who complains of blurring of vision with difficulty seeing to read and see TV.  She was evaluated and found to have a visual acuity best corrected at 20/60 each eye. There were bilateral cataracts.  Cataract extraction of the right eye was recommended, and she was admitted to undergo this procedure.  DESCRIPTION OF PROCEDURE:  Under the influence of IV sedation, Van Lint akinesia, retrobulbar anesthesia were given.  The patient was prepped and draped in the usual manner.  The lid speculum was inserted under the upper and lower lid of the right eye, and a 4-0 silk traction suture was passed through the belly of the superior rectus muscle for retraction.  A fornix-based conjunctival flap was turned and hemostasis was achieved by using cautery.  An incision was made in the sclera approximately 1 mm posterior to the limbus. This incision was dissected down to clear cornea using the crescent blade.  A side port incision was made at the 1:30 oclock position.  Occucoat was injected into the eye through the side port incision.  The anterior chamber was then entered through the corneoscleral incision at the 11:30 oclock position.  An anterior capsulotomy was done using a bent 25 gauge needle.  The nucleus was hydrodissected using Xylocaine.  The KPE handpiece was placed into the eye, and the nucleus was emulsified without difficulty.  The  residual cortical material was aspirated.  The posterior capsule was polished using an olive tip polisher.  The wound was widened slightly to accommodate a 5 x 6 folded intraocular lens.  This lens was seated in the eye behind the iris without difficulty.  The anterior chamber was reformed, and the pupil was constricted using Miochol.  The corneoscleral wound was then closed using a single horizontal suture of 10-0 nylon.  Before closing the suture, the residual Occucoat was aspirated from the eye.  After it was ascertained that the wound was airtight and watertight, the conjunctiva was closed using thermocautery.  Celestone 1 cc and 0.5 cc of gentamicin were injected subconjunctivally.  Maxitrol ophthalmic ointment and Pilopine ointment were applied along with a patch and Fox shield.  The patient tolerated the procedure well and was discharged to the postanesthesia recovery room in satisfactory condition.  She is instructed to keep the eye patched today, to take Vicodin every four hours as needed for pain, and to see me in the office tomorrow for further evaluation. DD:  08/15/00 TD:  08/16/00 Job: [id number]

## 2011-03-19 NOTE — H&P (Signed)
[name], [name]             ACCOUNT NO.:  [id number]   MEDICAL RECORD NO.:  [id number]          PATIENT TYPE:  ORB   LOCATION:  NA                           FACILITY:  [facility]   PHYSICIAN:  Ricki Rodriguez, M.D.  DATE OF BIRTH:  1941/09/04   DATE OF ADMISSION:  11/11/2004  DATE OF DISCHARGE:                                HISTORY & PHYSICAL   CHIEF COMPLAINT:  Dizziness.   HISTORY OF PRESENT ILLNESS:  This 70 year old Asian-American female fell at  home, with a loss of consciousness and a nose bleed near the bathroom,  following a complaint of complete body aches.  The patient denies any chest  pain, no loss of vision, no bleeding from the bowel, no heart palpitations.  She sustained a fracture of the right-sided zygomatic and maxillary bones,  requiring surgical correction by Dr. Lovena Le, D.D.S., on October 31, 2004.   PAST MEDICAL HISTORY/SOCIAL HISTORY:  Negative for diabetes, hypertension,  smoking, alcohol intake, obesity, or exercise.  The patient also does not  take any street drugs.   FAMILY HISTORY:  No premature coronary artery disease in the family.  Mom  died in her 34's, in 58.  Dad died in his 78's.  She had one brother who  died in the 1970's, of a stroke.  One sister who died in 9, at age 50.   MEDICATIONS:  1.  Folic acid 1 mg daily.  2.  Docusate sodium 100 mg daily.  3.  Megace 80 mg b.i.d.  4.  Claritin 10 mg daily.  5.  Vicodin 5/500 mg q.i.d.  6.  Ferrous sulfate 325 mg, one in the evening.  7.  Calcium 600 mg, plus vitamin D, one daily in the morning.  8.  Prednisone 10 mg, one daily.  9.  Toprol XL 25 mg, one daily.   ALLERGIES:  No known drug allergies.   PERSONAL HISTORY:  The patient is married.  Her husband is 63 years old, who  has had coronary artery disease and a cerebrovascular accident.  The patient  has one son who is age 65.   REVIEW OF SYSTEMS:  The patient has a vision change but does not wear  glasses.  No  cataract.  No hearing loss.  Does not wear dentures.  No  history of a cough, asthma, or pneumonia.  She has a history of chest pain  and rheumatoid arthritis. No history of nausea, vomiting, diarrhea, or  constipation.  No history of bleeding from the bowel.  No history of a  stomach ulcer, blood transfusion, or hepatitis.  No history of kidney  stones.  No history of strokes, seizures, or psychiatric admissions.  No  history of a flu shot.   PHYSICAL EXAMINATION:  VITAL SIGNS:  Pulse 94, respirations 16, blood  pressure 100/60, temperature 98 degrees.  Oxygen saturation 100% on room  air.  The patient is 5 feet 3 inches.  She weighs 86 pounds.  HEENT:  The patient is normocephalic with mild swelling of the right  maxillary and zygomatic area with two healing scars of surgery.  Her speech  is soft.  She has lost two front upper teeth.  NECK:  No jugular venous distention.  Mild tenderness in the back of the  neck.  LUNGS:  Clear bilaterally.  HEART:  Normal S1, S2.  ABDOMEN:  Soft, nontender.  EXTREMITIES:  Arthritic changes in both hands and feet, with swelling at the  wrist and ankle areas.  There is also a tenderness over the right shoulder  with a limited abduction to 45 degrees.   ASSESSMENT:  1.  Right zygomatic bone/maxillary complex fracture, status post surgery.  2.  Dizziness.  3.  Rheumatoid arthritis.  4.  Malnutrition.  5.  Anemia.  6.  Right shoulder pain.   RECOMMENDATIONS:  This patient will have a PT and OT consultation and  evaluation and treatment for gait and for muscle strengthening.  She will  need some encouragement in oral fluid intake to avoid dehydration.  She will  have pain medications for pain control, and she will continue her folic  acid, iron pill, calcium pill, a small dose of Toprol XL as before.      ASK/MEDQ  D:  11/11/2004  T:  11/11/2004  Job:  [id number]

## 2011-03-19 NOTE — Discharge Summary (Signed)
NAME:  [name], [name]                       ACCOUNT NO.:  [id number]   MEDICAL RECORD NO.:  [id number]                   PATIENT TYPE:  INP   LOCATION:  3709                                 FACILITY:  [facility]   PHYSICIAN:  Ricki Rodriguez, M.D.               DATE OF BIRTH:  [birth year]   DATE OF ADMISSION:  04/28/2004  DATE OF DISCHARGE:  04/30/2004                                 DISCHARGE SUMMARY   PRINCIPAL DIAGNOSES:  1. Chest pain.  2. Protein calorie malnutrition.  3. Rheumatoid arthritis.  4. Anemia.   DISCHARGE MEDICATIONS:  1. Flexeril 5 mg t.i.d.  2. Prednisone 10 mg half tablet daily.  3. Megace 40 mg one daily.  4. Vicodin 5/500 one b.i.d.  5. Tramadol 50 mg one daily.   PAIN MANAGEMENT:  The patient to apply rib belt as needed during the daytime  only.   ACTIVITY:  As tolerated.  Patient to use walker if needed.   DIET:  Low fat, low salt diet.   FOLLOWUP:  By Dr. Orpah Cobb in one week.  Patient to call [phone] for  appointment.   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  Patient fell and slipped on wet  floor without loss of consciousness.   PHYSICAL EXAMINATION:  VITAL SIGNS:  Temperature 98.6, pulse 81,  respirations 24, blood pressure 93/70, height 5 feet 3 inches, weight 90  pounds.  Oxygen saturation 99% on room air.  GENERAL:  The patient was alert and oriented x3.  HEENT:  Normocephalic, atraumatic.  Eyes brown.  Pupils equally react to  light.  Extraocular movements intact.  Conjunctivae pale pink.  Ear, nose,  throat:  Mucous membrane pink, moist.  NECK:  No JVD.  LUNGS:  Clear.  MUSCULOSKELETAL:  Bilateral chest wall tenderness on the right mid ribs and  axillary areas and right upper quadrant of the abdomen.  HEART:  Normal S1, S2 with grade 2/6 systolic murmur.  ABDOMEN:  Soft and right  upper quadrant area tenderness.  EXTREMITIES:  Deformities of the hands, wrists, and feet.  CNS:  Grossly intact.   LABORATORY DATA:  Slightly low hemoglobin of 11.4, hematocrit 33.3, normal  WBC count, platelet count.  Normal electrolytes, BUN, creatinine, and  glucose.  Normal CK and MB.  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  X-ray of the bilateral ribs, lumbar spine were unremarkable  except for scoliosis and degenerative changes on x-rays of the spine.  CT of  the head was without any acute intracranial pathology.   HOSPITAL COURSE:  The patient was admitted to telemetry unit.  Myocardial  infarction was ruled out.  She had a significant relief in chest pain with  her pain medications.  Hence, she was discharged home in satisfactory  condition after checking  her lipid panel which was normal and after checking  anemia studies showing low iron of 38 and TIBC of 358 and 11% saturation.  She will be followed by me in one week.                                                Ricki Rodriguez, M.D.    ASK/MEDQ  D:  06/18/2004  T:  06/19/2004  Job:  [id number]

## 2011-03-19 NOTE — Discharge Summary (Signed)
[name], [name]             ACCOUNT NO.:  [id number]   MEDICAL RECORD NO.:  [id number]          PATIENT TYPE:  INP   LOCATION:  4730                         FACILITY:  [facility]   PHYSICIAN:  Ricki Rodriguez, M.D.  DATE OF BIRTH:  02-06-1941   DATE OF ADMISSION:  10/29/2004  DATE OF DISCHARGE:  11/06/2004                                 DISCHARGE SUMMARY   CONSULTATIONS:  The patient had consultation by Dr. Graylon Gunning.   PRINCIPAL DIAGNOSES:  1.  Right zygomatical maxillary fracture.  2.  Rheumatoid arthritis.  3.  Anemia of chronic disease/ iron-deficiency anemia.  4.  Right shoulder pain.  5.  Malnutrition.  6.  Dehydration.   DISCHARGE MEDICATIONS:  1.  Folic acid of 1 mg daily.  2.  Docusate sodium 100 mg one daily.  3.  Atarax 10 mg one daily.  4.  Megace 40 mg one twice daily.  5.  Claritin 10 mg one daily.  6.  Augmentin 875 mg twice daily for two more days.  7.  Vicodin 5/500 one four times daily as needed  for pain.  8.  Ferrous sulfate 325 mg one daily in the evening.  9.  Calcium 600 mg one daily in the morning.  10. Prednisone 10 mg. one daily.   DIET:  Soft and pureed diet as tolerated.   ACTIVITY:  As tolerated with the use of a cane or other support.   CONDITION ON DISCHARGE:  Improved.   FOLLOWUP:  By Dr. Orpah Cobb in two weeks.   DISPOSITION:  SACU unit or skilled nursing facility as arranged.   HISTORY:  This 70 year old Asian-American female fell at home with loss of  consciousness, nosebleed near the bathroom following complete body ache.  The patient had denied any heart palpitations, chest pain or loss of vision.   PHYSICAL EXAMINATION:  VITAL SIGNS: Temperature 98, pulse 112, respirations  18, blood pressure 121/81, oxygen saturation 100%.  GENERAL:  The patient alert, oriented x3.  Height 5 feet 3 inches, weight 88  pounds.  HEENT:  The patient was normocephalic with a tender bilateral maxillary  frontal area.  Speech is softer due  to articulation problem.  The patient  lost front teeth.  NECK:  No JVD.  Back of the neck is tender.  LUNGS:  Clear bilaterally.  HEART:  Normal S1 and S2.  ABDOMEN:  Soft, nontender.  EXTREMITIES:  Wrist, feet and right shoulder tender on palpation along with  arthritic changes in both hands and feet.  CNS:  Cranial nerves grossly intact.   LABORATORY DATA:  WBC elevated at 13,800, hemoglobin 11.5, hematocrit 33,  platelet count normal.  Subsequent WBC count came down to 7600.  Her PT/PTT  was normal.  Electrolytes showed a sodium 132, potassium 4.1, glucose 111,  BUN 12, creatinine 0.8.  Subsequent sodium was 139 on November 02, 2004 with  her sugar down to 106.  Urinalysis was grossly unremarkable with a small  leukocyte esterase.  Her CT scan of the head showed complex zygomatical  maxillary fracture of the right side.  Her x-ray of  the right shoulder was  negative for any fracture.  Her EKG showed sinus rhythm with a nonspecific T-  wave changes.   HOSPITAL COURSE:  The patient was admitted to telemetry unit.  She was  started on IV Unasyn.  Her surgery was postponed by 36 hours due to elevated  WBC count and a fever up to 102 degree F.  She responded very well to Unasyn  use and she underwent surgery on October 31, 2005.  Post surgery, she had a  slow recovery.  Initially able to tolerate only liquid and then advanced to  soft and pureed diet.  On November 02, 2004, with the patient's overall  weakness, arthritis and not able to take care of her 77 year old husband, it  was decided to transfer the patient January 9 or 10, 2006 to [place] or skilled  nursing facility for additional physical therapy, strength and facial  healing until she can return to home.      Ajay   ASK/MEDQ  D:  11/06/2004  T:  11/06/2004  Job:  [id number]

## 2011-03-19 NOTE — Op Note (Signed)
[name], [name]             ACCOUNT NO.:  [id number]   MEDICAL RECORD NO.:  [id number]          PATIENT TYPE:  INP   LOCATION:  2899                         FACILITY:  [facility]   PHYSICIAN:  Veronica Ano. Gramig III, M.D.DATE OF BIRTH:  1941-04-02   DATE OF PROCEDURE:  01/27/2006  DATE OF DISCHARGE:                                 OPERATIVE REPORT   PREOPERATIVE DIAGNOSIS:  Severe rheumatoid arthritic deformity, right wrist,  with volar subluxation of the carpus, distal radioulnar joint arthritis and  subluxation of the distal ulna.  Chronic pain, synovitis and failure of  conservative management.   POSTOPERATIVE DIAGNOSIS:  Severe rheumatoid arthritic deformity, right  wrist, with volar subluxation of the carpus, distal radioulnar joint  arthritis and subluxation of the distal ulna.  Chronic pain, synovitis and  failure of conservative management.   PROCEDURES:  1.  Total wrist fusion with Symphony bone graft and autograft obtained from      the distal ulna.  2.  Posterior interosseous nerve neurectomy, right wrist.  3.  Extensor pollicis longus decompression and transposition, right wrist.  4.  Stress radiography.  5.  Distal ulna resection (Darrach procedure), right wrist.  6.  Extensor carpi ulnaris tendon transfer one-half slip to the distal ulna      and back upon itself for stabilization purposes.  This was a tendon      transfer.   SURGEON:  [name], M.D.   ASSISTANT:  Karie Chimera, P.A.-C.   ANESTHESIA:  General.   ESTIMATED BLOOD LOSS:  Less than 100 mL.   DRAINS:  One.   INDICATION FOR PROCEDURE:  The patient is a 70 year old female who is in  relatively good health.  I have discussed her care and medical clearance  with her regular physician.  She presents with the above-mentioned  diagnosis, understanding and accepting the risks and benefits of surgery,  including the risk of infection, bleeding, anesthesia, damage to normal  structures and  failure of surgery to accomplish its intended goals of  relieving symptoms and restoring function.  In addition to this, I have  specifically discussed with her risk of nerve injury, compartment syndrome,  dystrophy, nonunion, malunion, and other hardware failure and other issues.  With this in mind she desires to proceed.   OPERATION IN DETAIL:  The patient was seen by myself and anesthesia, taken  to the operative suite.  Underwent smooth induction of IV antibiotics, was  counseled, taken to the operative suite, laid supine and given a general  anesthetic and then prepped and draped in the usual sterile fashion about  the right upper extremity with Betadine scrub and paint.  Once this was  done, the patient then underwent tourniquet insufflation to 250 mmHg.  A  midline incision was made about the dorsal wrist.  Dissection was carried  down.  I elevated skin flaps full-thickness in nature, and this was done  without difficulty.  Following elevation of these flaps, I then performed  incision about the distal ulnar capsule.  The distal ulna was evaluated.  I  made a beveled cut proximal to  the distal radioulnar joint with saw blade,  then beveled this and then placed a drill hole in the most dorsal aspect of  the ulna.  I rasped the area to create a smooth surface and following this  obtained bone graft from the distal ulna that was excised.  I excised this  without difficulty, taking care to preserve and protect the volar  neurovascular structures.  Following removal of the distal ulna, I then  performed an ECU partial tenotomy of one-half slip.  This was left attached  distally.  I then placed it through the drill hole in a Pulvertaft weave  fashion to stabilize the distal ulna.  This was inset with Fibrewire and  following this, the capsule was closed with Fibrewire without difficulty.  This was done to my satisfaction without difficulty.  There was a large  amount of synovitis in the  distal radioulnar joint, which was removed  atraumatically and sharply.  With this being performed, I then performed an  incision between the third and fourth compartments, EPL tendon was  decompressed and transposed into the subcutaneous tissue without difficulty  and left there.  The superficial radial nerve was protected.  I then  accessed the wrist joint, performed a posterior interosseous nerve  neurectomy and following this performed crushing and cauterization of the  nerve.  This was last retracted proximally.  I then accessed the wrist  joint.  Preparation of the radiocarpal and midcarpal joint was created with  rongeur, curette, etc.  Following this I then performed placement of bone  graft.  Symphony bone graft was placed as well as autologous bone graft from  her distal ulna.  Once this was done, I then placed additional thrombin-poor  mixture in the wrist and placed two Steinmann pins about the second and  third intermetacarpal spaces.  These Steinmann pins were placed meticulously  in order to avoid the volar neurovascular structures.  They were advanced  across the radiocarpal and midcarpal joints and into the radius.  They were  set to appropriate levels.  Fine-tuning adjustment was performed without  difficulty to make sure their length and position was proper.  I was pleased  with this and their stabilization.  Following this, I then deflated the  tourniquet, closed the capsule with Fibrewire, placed additional bone graft,  and the thrombin-poor mixture was then placed in the wound additionally.  Once this was done, we then performed closure of the wound with Vicryl  followed by interrupted Prolene.  A TLS drain was placed to suction and 15  mL of Marcaine 0.25% with epinephrine was placed in the drain for postop  analgesia.  Following this the additional second and third dorsal web space  incisions were closed.  She tolerated this well.  There were no  complicating features, and all sponge, needle and instrument counts were reported as  correct.  She was radiographed multiple times during the procedure.  I  performed the radiographs without difficulty to my satisfaction, verifying  correct position and placement.  She then was placed in a sterile dressing,  the drain hooked to suction.  A sugar tong splint was applied without  difficulty and she was then taken to the recovery room, where she was noted  to be in stable condition.  Will plan for additional antibiotics in the  recovery room, pain management according to her needs, elevation, ice, and  general postop observation for her compartments and neurovascular  structures.  All questions have been  encouraged and answered.  I have  discussed all issues with her husband.           ______________________________  Veronica Ano. Everlene Other, M.D.     Nash Mantis  D:  01/27/2006  T:  01/29/2006  Job:  [id number]

## 2011-03-19 NOTE — Op Note (Signed)
[name], [name]             ACCOUNT NO.:  [id number]   MEDICAL RECORD NO.:  [id number]          PATIENT TYPE:  INP   LOCATION:  4730                         FACILITY:  [facility]   PHYSICIAN:  Sable Feil., D.D.S.DATE OF BIRTH:  [birth year]   DATE OF PROCEDURE:  10/31/2004  DATE OF DISCHARGE:                                 OPERATIVE REPORT   PREOPERATIVE DIAGNOSIS:  Comminuted right zygomaticomaxillary complex  fracture, including fractures at the right zygomatic arch, infraorbital and  lateral orbital rims and anterior and posterior maxillary sinus walls.   POSTOPERATIVE DIAGNOSIS:  Comminuted right zygomaticomaxillary complex  fracture, including fractures at the right zygomatic arch, infraorbital and  lateral orbital rims and anterior and posterior maxillary sinus walls.   PROCEDURES:  1.  Open reduction with internal fixation of right zygomaticomaxillary      complex fracture.  2.  Extraction of fractured and avulsed teeth #8 and 9.   ANESTHESIA:  General via nasal endotracheal intubation.   PROCEDURE IN DETAIL:  The patient was brought to the operating room in  stable preoperative condition and placed on the operating room table in the  supine position.  Following successful induction of general anesthesia via  nasal endotracheal intubation.  The patient was prepped and draped in the  usual sterile fashion for a procedure of this type.  Next, attention was  turned to the right face.  Approximately 3 mL of a 2% lidocaine solution  with 1:100,000 epinephrine was injected into the right lower eyelid at the  level of the infraorbital rim and also at the lateral rim and eyebrow level  through an existing laceration.  It was noted that an approximate 1 cm  laceration existed along the right lateral rim just inferior to the eyebrow.  This incision was then extended an additional centimeter with a #15 scalpel.  The incision was carried down through skin and subcutaneous  tissue.  The  periosteum was then sharply divided over the right lateral orbital rim.  A  periosteal elevator was then utilized to raise and subperiosteal flap  exposing a fracture at the right frontozygomatic suture.  This site was then  packed with a sterile gauze and attention was directed to the lower eyelid.  A lower lid incision was created in a natural skin crease extending 2 cm at  the infraorbital rim.  The incision was carried down through skin and  subcutaneous tissue.  The fracture at the infraorbital rim was then palpated  and the orbicularis oculi was then sharply divided along with the periosteum  at the infraorbital rim.  Again a periosteal elevator was utilized to raise  the periosteum from the bone thereby exposing the fracture.  The zygoma was  then reduced to its normal anatomic position at the infraorbital rim and  lateral orbital rim.  These areas were then fixated with Leibinger titanium  trauma plates.  A 6-hole 1.5 mm plate was adapted to the infraorbital rim  and secured with six 1.2 x 4 mm screws.  A 4-hole 1.7 mm plate was adapted  to the lateral orbital rim and secured  with four 1.7 x 4 mm screws.  Attention was then directed intraorally where an additional 2 mL of 2%  lidocaine with 1:100,000 epinephrine was infiltrated first at the right  posterior vestibular region and also anteriorly in the area of teeth #8 and  9 which were grossly mobile.  An incision was then created at the vestibule  at the right posterior maxilla.  Dissection proceeded down to the anterior  maxillary wall and maxillary buttress.  A periosteal elevator was then  introduced along the lateral and superior aspect of the buttress extending  up beneath the depressed zygomatic arch.  It was then swept in a posterior  to anterior direction and thereby reducing the fractured zygomatic arch  laterally so as to eliminate its impingement on the mandible during  mandibular function.  This was  verified with manual manipulation of the  mandible and we were able to achieve approximately a 45 mm opening between  the maxillary and mandibular incisor teeth.  All operative sites were then  thoroughly irrigated utilizing an antibiotic irrigation solution.  Teeth #8  and 9, which were fractured and partially avulsed, were then surgically  removed as well.  The periorbital incisions were closed.  The deep layers,  including periosteum and the muscular layers, were closed with 5-0 Vicryl at  the infraorbital incision and 4-0 Vicryl at the lateral orbital incision.  Then 6-0 Prolene was utilized to close the skin.  The skin was also closed  at the outer aspect with an application of Dermabond.  The intraoral  incision was closed with a 4-0 chromic gut suture.  This completed the  procedure.  It should also be noted that a scleral shield was utilized with  Lacri-Lube in the right eye throughout the procedure and this was removed at  the conclusion of the procedure.  The patient was then allowed to recover  from general anesthesia and was extubated and transported to the  postanesthesia care unit in satisfactory postoperative condition.  A  malleable aluminum and foam rubber splint was adapted to the right face so  as to protect the reduced zygomatic arch.       JWB/MEDQ  D:  10/31/2004  T:  10/31/2004  Job:  [id number]

## 2011-07-26 LAB — HEPARIN LEVEL (UNFRACTIONATED)
Heparin Unfractionated: 0.27 — ABNORMAL LOW
Heparin Unfractionated: 0.36
Heparin Unfractionated: 0.38
Heparin Unfractionated: 0.38

## 2011-07-26 LAB — CBC
HCT: 34.1 — ABNORMAL LOW
HCT: 34.7 — ABNORMAL LOW
HCT: 30.5 — ABNORMAL LOW
HCT: 30.9 — ABNORMAL LOW
HCT: 31.1 — ABNORMAL LOW
HCT: 31.8 — ABNORMAL LOW
HCT: 32.8 — ABNORMAL LOW
Hemoglobin: 11.6 — ABNORMAL LOW
Hemoglobin: 11.7 — ABNORMAL LOW
Hemoglobin: 11.6 — ABNORMAL LOW
MCHC: 34
MCHC: 34.2
MCHC: 33
MCHC: 33.5
MCHC: 33.9
MCHC: 34
MCHC: 34
MCHC: 34.2
MCV: 89
MCV: 89.6
MCV: 88.4
MCV: 88.6
MCV: 89
MCV: 89
MCV: 89.2
Platelets: 240
Platelets: 286
Platelets: 321
Platelets: 353
Platelets: 391
Platelets: 406 — ABNORMAL HIGH
Platelets: 434 — ABNORMAL HIGH
RBC: 3.81 — ABNORMAL LOW
RDW: 13.5
RDW: 13.6
RDW: 13.3
RDW: 13.3
RDW: 13.8
RDW: 13.9
WBC: 8.7
WBC: 9.3
WBC: 6.4
WBC: 7.1

## 2011-07-26 LAB — URINE CULTURE: Colony Count: 15000

## 2011-07-26 LAB — DIFFERENTIAL
Basophils Absolute: 0
Eosinophils Relative: 0
Lymphocytes Relative: 9 — ABNORMAL LOW
Monocytes Absolute: 1.2 — ABNORMAL HIGH

## 2011-07-26 LAB — URINALYSIS, ROUTINE W REFLEX MICROSCOPIC
Glucose, UA: NEGATIVE
Leukocytes, UA: NEGATIVE
Protein, ur: NEGATIVE
Specific Gravity, Urine: 1.003 — ABNORMAL LOW
Urobilinogen, UA: 0.2

## 2011-07-26 LAB — LIPID PANEL
Cholesterol: 161
HDL: 62

## 2011-07-26 LAB — PROTIME-INR
INR: 1.1
Prothrombin Time: 14.3

## 2011-07-26 LAB — BASIC METABOLIC PANEL
CO2: 27
Calcium: 8.7
Chloride: 99
Creatinine, Ser: 0.55
GFR calc Af Amer: >60
GFR calc Af Amer: >60
GFR calc non Af Amer: >60
Potassium: 4
Sodium: 135
Sodium: 135

## 2011-07-26 LAB — I-STAT 8, (EC8 V) (CONVERTED LAB)
Acid-Base Excess: 2
BUN: 10
Bicarbonate: 25.4 — ABNORMAL HIGH
HCT: 38
Hemoglobin: 12.9
Operator id: 285841
Sodium: 132 — ABNORMAL LOW
TCO2: 26

## 2011-07-26 LAB — CULTURE, BLOOD (ROUTINE X 2): Culture: NO GROWTH

## 2011-07-26 LAB — POCT I-STAT CREATININE
Creatinine, Ser: 0.9
Operator id: 285841

## 2011-07-26 LAB — PREALBUMIN: Prealbumin: 9.2 — ABNORMAL LOW

## 2011-07-26 LAB — URINE MICROSCOPIC-ADD ON

## 2011-07-26 LAB — POCT CARDIAC MARKERS: Troponin i, poc: <0.05

## 2011-08-10 LAB — BASIC METABOLIC PANEL
BUN: 17
CO2: 27
GFR calc non Af Amer: >60
Glucose, Bld: 100 — ABNORMAL HIGH
Potassium: 3.9
Sodium: 138

## 2011-08-19 LAB — URINALYSIS, ROUTINE W REFLEX MICROSCOPIC
Glucose, UA: NEGATIVE
Specific Gravity, Urine: 1.016
pH: 6.5

## 2011-08-19 LAB — POCT I-STAT CREATININE: Operator id: 151321

## 2011-08-19 LAB — URINE MICROSCOPIC-ADD ON

## 2011-08-19 LAB — I-STAT 8, (EC8 V) (CONVERTED LAB)
Glucose, Bld: 92
HCT: 40
Potassium: 4.7
TCO2: 27
pCO2, Ven: 43.6 — ABNORMAL LOW
pH, Ven: 7.38 — ABNORMAL HIGH

## 2012-05-18 ENCOUNTER — Other Ambulatory Visit: Payer: Self-pay | Admitting: Orthopedic Surgery

## 2012-05-25 ENCOUNTER — Encounter (HOSPITAL_COMMUNITY): Payer: Self-pay

## 2012-05-25 ENCOUNTER — Encounter (HOSPITAL_COMMUNITY)
Admission: RE | Admit: 2012-05-25 | Discharge: 2012-05-25 | Disposition: A | Discharge status: Home / Self Care | Payer: PRIVATE HEALTH INSURANCE | Source: Ambulatory Visit | Attending: Orthopedic Surgery | Admitting: Orthopedic Surgery

## 2012-05-25 HISTORY — DX: Headache: R51

## 2012-05-25 HISTORY — DX: Dorsalgia, unspecified: M54.9

## 2012-05-25 HISTORY — DX: Unspecified osteoarthritis, unspecified site: M19.90

## 2012-05-25 HISTORY — DX: Gastro-esophageal reflux disease without esophagitis: K21.9

## 2012-05-25 HISTORY — DX: Unspecified cataract: H26.9

## 2012-05-25 HISTORY — DX: Other chronic pain: G89.29

## 2012-05-25 LAB — CBC
HCT: 35.7 % — ABNORMAL LOW (ref 36.0–46.0)
Hemoglobin: 12.4 g/dL (ref 12.0–15.0)
MCH: 30.3 pg (ref 26.0–34.0)
MCHC: 34.7 g/dL (ref 30.0–36.0)
RBC: 4.09 MIL/uL (ref 3.87–5.11)

## 2012-05-25 LAB — SURGICAL PCR SCREEN
MRSA, PCR: NEGATIVE
Staphylococcus aureus: NEGATIVE

## 2012-05-25 LAB — BASIC METABOLIC PANEL
Chloride: 100 mEq/L (ref 96–112)
Creatinine, Ser: 0.66 mg/dL (ref 0.50–1.10)
GFR calc Af Amer: >90 mL/min (ref >90)

## 2012-05-25 NOTE — Pre-Procedure Instructions (Signed)
20 Aria LECHELLE WRIGLEY  05/25/2012   Your procedure is scheduled on:  Tuesday June 06, 2012  Report to [facility] Short Stay Center at 11:00am.  Call this number if you have problems the morning of surgery: [phone]   Remember:   Do not eat food or drink:After Midnight.      Take these medicines the morning of surgery with A SIP OF WATER: hydrocodone, prilosec    Do not wear jewelry, make-up or nail polish.  Do not wear lotions, powders, or perfumes. You may wear deodorant.  Do not shave 48 hours prior to surgery. Men may shave face and neck.  Do not bring valuables to the hospital.  Contacts, dentures or bridgework may not be worn into surgery.  Leave suitcase in the car. After surgery it may be brought to your room.  For patients admitted to the hospital, checkout time is 11:00 AM the day of discharge.   Patients discharged the day of surgery will not be allowed to drive home.  Name and phone number of your driver: transportation co. [phone]  Special Instructions: Incentive Spirometry - Practice and bring it with you on the day of surgery. and CHG Shower Use Special Wash: 1/2 bottle night before surgery and 1/2 bottle morning of surgery.   Please read over the following fact sheets that you were given: Pain Booklet, Coughing and Deep Breathing, MRSA Information and Surgical Site Infection Prevention

## 2012-05-25 NOTE — Progress Notes (Signed)
Utilized telephonic interpreter for PAT appointment

## 2012-06-05 MED ORDER — CHLORHEXIDINE GLUCONATE 4 % EX LIQD: 60.0000 mL | Freq: Once | CUTANEOUS | Status: DC

## 2012-06-05 MED ORDER — SODIUM CHLORIDE 0.45 % IV SOLN: INTRAVENOUS | Status: DC

## 2012-06-05 MED ORDER — CEFAZOLIN SODIUM-DEXTROSE 2-3 GM-% IV SOLR
2.0000 g | INTRAVENOUS | Status: DC
  Filled 2012-06-05: qty 50

## 2012-06-06 ENCOUNTER — Ambulatory Visit (HOSPITAL_COMMUNITY)
Admission: RE | Admit: 2012-06-06 | Discharge: 2012-06-08 | Disposition: A | Discharge status: Home / Self Care | Payer: PRIVATE HEALTH INSURANCE | Source: Ambulatory Visit | Attending: Orthopedic Surgery | Admitting: Orthopedic Surgery

## 2012-06-06 ENCOUNTER — Encounter (HOSPITAL_COMMUNITY)
Admission: RE | Disposition: A | Discharge status: Home / Self Care | Payer: Self-pay | Source: Ambulatory Visit | Attending: Orthopedic Surgery

## 2012-06-06 ENCOUNTER — Ambulatory Visit (HOSPITAL_COMMUNITY): Payer: PRIVATE HEALTH INSURANCE | Admitting: Certified Registered Nurse Anesthetist

## 2012-06-06 ENCOUNTER — Encounter (HOSPITAL_COMMUNITY): Payer: Self-pay | Admitting: *Deleted

## 2012-06-06 ENCOUNTER — Encounter (HOSPITAL_COMMUNITY): Payer: Self-pay | Admitting: Certified Registered Nurse Anesthetist

## 2012-06-06 DIAGNOSIS — K219 Gastro-esophageal reflux disease without esophagitis: Secondary | ICD-10-CM | POA: Insufficient documentation

## 2012-06-06 DIAGNOSIS — M549 Dorsalgia, unspecified: Secondary | ICD-10-CM | POA: Insufficient documentation

## 2012-06-06 DIAGNOSIS — M069 Rheumatoid arthritis, unspecified: Secondary | ICD-10-CM

## 2012-06-06 DIAGNOSIS — G8929 Other chronic pain: Secondary | ICD-10-CM | POA: Insufficient documentation

## 2012-06-06 DIAGNOSIS — R51 Headache: Secondary | ICD-10-CM | POA: Insufficient documentation

## 2012-06-06 DIAGNOSIS — Z01812 Encounter for preprocedural laboratory examination: Secondary | ICD-10-CM | POA: Insufficient documentation

## 2012-06-06 HISTORY — PX: FINGER ARTHRODESIS: SHX5000

## 2012-06-06 SURGERY — FUSION, JOINT, FINGER
Anesthesia: General | Site: Finger | Laterality: Right | Wound class: Clean

## 2012-06-06 MED ORDER — LACTATED RINGERS IV SOLN
INTRAVENOUS | Status: DC | PRN
  Administered 2012-06-06: 21:00:00 via INTRAVENOUS

## 2012-06-06 MED ORDER — TEMAZEPAM 15 MG PO CAPS: 15.0000 mg | ORAL_CAPSULE | Freq: Every evening | ORAL | Status: DC | PRN

## 2012-06-06 MED ORDER — DIPHENHYDRAMINE HCL 25 MG PO CAPS: 25.0000 mg | ORAL_CAPSULE | Freq: Four times a day (QID) | ORAL | Status: DC | PRN

## 2012-06-06 MED ORDER — BUPIVACAINE HCL (PF) 0.25 % IJ SOLN
INTRAMUSCULAR | Status: DC | PRN
  Administered 2012-06-06: 10 mL

## 2012-06-06 MED ORDER — MORPHINE SULFATE 2 MG/ML IJ SOLN: 1.0000 mg | INTRAMUSCULAR | Status: DC | PRN

## 2012-06-06 MED ORDER — ONDANSETRON HCL 4 MG/2ML IJ SOLN
4.0000 mg | Freq: Four times a day (QID) | INTRAMUSCULAR | Status: DC | PRN
  Administered 2012-06-07: 4 mg via INTRAVENOUS
  Filled 2012-06-06: qty 2

## 2012-06-06 MED ORDER — ONDANSETRON HCL 4 MG PO TABS: 4.0000 mg | ORAL_TABLET | Freq: Four times a day (QID) | ORAL | Status: DC | PRN

## 2012-06-06 MED ORDER — MEGESTROL ACETATE 40 MG PO TABS
40.0000 mg | ORAL_TABLET | Freq: Two times a day (BID) | ORAL | Status: DC
  Administered 2012-06-07 – 2012-06-08 (×3): 40 mg via ORAL
  Filled 2012-06-06 (×4): qty 1

## 2012-06-06 MED ORDER — ADULT MULTIVITAMIN W/MINERALS CH
1.0000 | ORAL_TABLET | Freq: Every day | ORAL | Status: DC
  Administered 2012-06-07 – 2012-06-08 (×2): 1 via ORAL
  Filled 2012-06-06 (×2): qty 1

## 2012-06-06 MED ORDER — ONDANSETRON HCL 4 MG/2ML IJ SOLN
INTRAMUSCULAR | Status: DC | PRN
  Administered 2012-06-06: 4 mg via INTRAVENOUS

## 2012-06-06 MED ORDER — PROPOFOL 10 MG/ML IV BOLUS
INTRAVENOUS | Status: DC | PRN
  Administered 2012-06-06: 60 mg via INTRAVENOUS

## 2012-06-06 MED ORDER — ONDANSETRON HCL 4 MG/2ML IJ SOLN: 4.0000 mg | Freq: Once | INTRAMUSCULAR | Status: DC | PRN

## 2012-06-06 MED ORDER — HYDROMORPHONE HCL PF 1 MG/ML IJ SOLN
INTRAMUSCULAR | Status: AC
  Filled 2012-06-06: qty 1

## 2012-06-06 MED ORDER — HYDROXYCHLOROQUINE SULFATE 200 MG PO TABS
200.0000 mg | ORAL_TABLET | Freq: Every day | ORAL | Status: DC
  Administered 2012-06-07 – 2012-06-08 (×2): 200 mg via ORAL
  Filled 2012-06-06 (×2): qty 1

## 2012-06-06 MED ORDER — EPHEDRINE SULFATE 50 MG/ML IJ SOLN
INTRAMUSCULAR | Status: DC | PRN
  Administered 2012-06-06 (×3): 5 mg via INTRAVENOUS

## 2012-06-06 MED ORDER — LACTATED RINGERS IV SOLN
INTRAVENOUS | Status: DC
  Administered 2012-06-06: 18:00:00 via INTRAVENOUS

## 2012-06-06 MED ORDER — BUPIVACAINE HCL (PF) 0.25 % IJ SOLN
INTRAMUSCULAR | Status: AC
  Filled 2012-06-06: qty 30

## 2012-06-06 MED ORDER — SENNA 8.6 MG PO TABS
1.0000 | ORAL_TABLET | Freq: Two times a day (BID) | ORAL | Status: DC
  Administered 2012-06-07 – 2012-06-08 (×3): 8.6 mg via ORAL
  Filled 2012-06-06 (×4): qty 1

## 2012-06-06 MED ORDER — DEXTROSE 5 % IV SOLN
INTRAVENOUS | Status: DC | PRN
  Administered 2012-06-06: 21:00:00 via INTRAVENOUS

## 2012-06-06 MED ORDER — CEFAZOLIN SODIUM 1-5 GM-% IV SOLN
1.0000 g | INTRAVENOUS | Status: AC
  Administered 2012-06-07: 1 g via INTRAVENOUS
  Filled 2012-06-06: qty 50

## 2012-06-06 MED ORDER — CEFAZOLIN SODIUM 1-5 GM-% IV SOLN
INTRAVENOUS | Status: DC | PRN
  Administered 2012-06-06: 2 g via INTRAVENOUS

## 2012-06-06 MED ORDER — HYDROCODONE-ACETAMINOPHEN 5-325 MG PO TABS
1.0000 | ORAL_TABLET | Freq: Two times a day (BID) | ORAL | Status: DC
  Administered 2012-06-07 – 2012-06-08 (×4): 1 via ORAL
  Filled 2012-06-06 (×4): qty 1

## 2012-06-06 MED ORDER — LIDOCAINE HCL 1 % IJ SOLN
INTRAMUSCULAR | Status: DC | PRN
  Administered 2012-06-06: 40 mg via INTRADERMAL

## 2012-06-06 MED ORDER — CYCLOSPORINE 0.05 % OP EMUL
1.0000 [drp] | Freq: Two times a day (BID) | OPHTHALMIC | Status: DC
  Administered 2012-06-07 – 2012-06-08 (×3): 1 [drp] via OPHTHALMIC
  Filled 2012-06-06 (×4): qty 1

## 2012-06-06 MED ORDER — CEFAZOLIN SODIUM 1-5 GM-% IV SOLN
1.0000 g | Freq: Three times a day (TID) | INTRAVENOUS | Status: DC
  Administered 2012-06-07 – 2012-06-08 (×4): 1 g via INTRAVENOUS
  Filled 2012-06-06 (×8): qty 50

## 2012-06-06 MED ORDER — PANTOPRAZOLE SODIUM 40 MG PO TBEC
40.0000 mg | DELAYED_RELEASE_TABLET | Freq: Every day | ORAL | Status: DC
  Administered 2012-06-07 – 2012-06-08 (×2): 40 mg via ORAL
  Filled 2012-06-06 (×2): qty 1

## 2012-06-06 MED ORDER — LACTATED RINGERS IV SOLN
INTRAVENOUS | Status: DC
  Administered 2012-06-06 – 2012-06-07 (×2): via INTRAVENOUS

## 2012-06-06 MED ORDER — FENTANYL CITRATE 0.05 MG/ML IJ SOLN
INTRAMUSCULAR | Status: DC | PRN
  Administered 2012-06-06 (×4): 25 ug via INTRAVENOUS

## 2012-06-06 MED ORDER — HYDROMORPHONE HCL PF 1 MG/ML IJ SOLN
0.2500 mg | INTRAMUSCULAR | Status: DC | PRN
  Administered 2012-06-06 (×4): 0.5 mg via INTRAVENOUS

## 2012-06-06 MED ORDER — 0.9 % SODIUM CHLORIDE (POUR BTL) OPTIME
TOPICAL | Status: DC | PRN
  Administered 2012-06-06: 1000 mL

## 2012-06-06 SURGICAL SUPPLY — 49 items
BANDAGE ELASTIC 3 VELCRO ST LF (GAUZE/BANDAGES/DRESSINGS) ×2 IMPLANT
BANDAGE ELASTIC 4 VELCRO ST LF (GAUZE/BANDAGES/DRESSINGS) IMPLANT
BANDAGE GAUZE ELAST BULKY 4 IN (GAUZE/BANDAGES/DRESSINGS) IMPLANT
BNDG COHESIVE 1X5 TAN STRL LF (GAUZE/BANDAGES/DRESSINGS) IMPLANT
CLOTH BEACON ORANGE TIMEOUT ST (SAFETY) ×2 IMPLANT
CORDS BIPOLAR (ELECTRODE) ×2 IMPLANT
COVER SURGICAL LIGHT HANDLE (MISCELLANEOUS) ×2 IMPLANT
CUFF TOURNIQUET SINGLE 18IN (TOURNIQUET CUFF) ×2 IMPLANT
CUFF TOURNIQUET SINGLE 24IN (TOURNIQUET CUFF) IMPLANT
DECANTER SPIKE VIAL GLASS SM (MISCELLANEOUS) ×2 IMPLANT
DRAPE OEC MINIVIEW 54X84 (DRAPES) IMPLANT
DRAPE SURG 17X23 STRL (DRAPES) ×2 IMPLANT
DRSG EMULSION OIL 3X3 NADH (GAUZE/BANDAGES/DRESSINGS) ×2 IMPLANT
DRSG KUZMA FLUFF (GAUZE/BANDAGES/DRESSINGS) ×2 IMPLANT
GAUZE SPONGE 2X2 8PLY STRL LF (GAUZE/BANDAGES/DRESSINGS) IMPLANT
GAUZE XEROFORM 1X8 LF (GAUZE/BANDAGES/DRESSINGS) ×2 IMPLANT
GLOVE BIOGEL M STRL SZ7.5 (GLOVE) ×2 IMPLANT
GLOVE SS BIOGEL STRL SZ 8 (GLOVE) ×1 IMPLANT
GLOVE SUPERSENSE BIOGEL SZ 8 (GLOVE) ×1
GOWN PREVENTION PLUS XLARGE (GOWN DISPOSABLE) ×2 IMPLANT
GOWN STRL NON-REIN LRG LVL3 (GOWN DISPOSABLE) ×4 IMPLANT
GOWN STRL REIN XL XLG (GOWN DISPOSABLE) ×4 IMPLANT
KIT BASIN OR (CUSTOM PROCEDURE TRAY) ×2 IMPLANT
KIT ROOM TURNOVER OR (KITS) ×2 IMPLANT
MANIFOLD NEPTUNE II (INSTRUMENTS) ×2 IMPLANT
NEEDLE HYPO 25GX1X1/2 BEV (NEEDLE) IMPLANT
NS IRRIG 1000ML POUR BTL (IV SOLUTION) ×2 IMPLANT
PACK ORTHO EXTREMITY (CUSTOM PROCEDURE TRAY) ×2 IMPLANT
PAD ARMBOARD 7.5X6 YLW CONV (MISCELLANEOUS) ×4 IMPLANT
PAD CAST 4YDX4 CTTN HI CHSV (CAST SUPPLIES) IMPLANT
PADDING CAST ABS 3INX4YD NS (CAST SUPPLIES) ×1
PADDING CAST ABS COTTON 3X4 (CAST SUPPLIES) ×1 IMPLANT
PADDING CAST COTTON 4X4 STRL (CAST SUPPLIES)
SOLUTION BETADINE 4OZ (MISCELLANEOUS) ×2 IMPLANT
SPECIMEN JAR SMALL (MISCELLANEOUS) ×2 IMPLANT
SPONGE GAUZE 2X2 STER 10/PKG (GAUZE/BANDAGES/DRESSINGS)
SPONGE GAUZE 4X4 12PLY (GAUZE/BANDAGES/DRESSINGS) IMPLANT
SPONGE SCRUB IODOPHOR (GAUZE/BANDAGES/DRESSINGS) ×2 IMPLANT
SUCTION FRAZIER TIP 10 FR DISP (SUCTIONS) IMPLANT
SUT MERSILENE 4 0 P 3 (SUTURE) IMPLANT
SUT PROLENE 4 0 PS 2 18 (SUTURE) IMPLANT
SUT VIC AB 2-0 CT1 27 (SUTURE)
SUT VIC AB 2-0 CT1 TAPERPNT 27 (SUTURE) IMPLANT
SYR CONTROL 10ML LL (SYRINGE) IMPLANT
TOWEL OR 17X24 6PK STRL BLUE (TOWEL DISPOSABLE) ×2 IMPLANT
TOWEL OR 17X26 10 PK STRL BLUE (TOWEL DISPOSABLE) ×2 IMPLANT
TUBE CONNECTING 12X1/4 (SUCTIONS) IMPLANT
UNDERPAD 30X30 INCONTINENT (UNDERPADS AND DIAPERS) ×2 IMPLANT
WATER STERILE IRR 1000ML POUR (IV SOLUTION) ×2 IMPLANT

## 2012-06-06 NOTE — Anesthesia Postprocedure Evaluation (Signed)
  Anesthesia Post-op Note  Patient: [name]  Procedure(s) Performed: Procedure(s) (LRB): ARTHRODESIS FINGER (Right)  Patient Location: PACU  Anesthesia Type: General  Level of Consciousness: awake, alert  and oriented  Airway and Oxygen Therapy: Patient Spontanous Breathing and Patient connected to nasal cannula oxygen  Post-op Pain: mild  Post-op Assessment: Post-op Vital signs reviewed  Post-op Vital Signs: Reviewed  Complications: No apparent anesthesia complications 

## 2012-06-06 NOTE — Op Note (Signed)
See dictation # [id number] Dominica Severin MD

## 2012-06-06 NOTE — Preoperative (Signed)
Beta Blockers   Reason not to administer Beta Blockers:Not Applicable 

## 2012-06-06 NOTE — Anesthesia Postprocedure Evaluation (Signed)
  Anesthesia Post-op Note  Patient: [name]  Procedure(s) Performed: Procedure(s) (LRB): ARTHRODESIS FINGER (Right)  Patient Location: PACU  Anesthesia Type: General  Level of Consciousness: awake, alert  and oriented  Airway and Oxygen Therapy: Patient Spontanous Breathing and Patient connected to nasal cannula oxygen  Post-op Pain: mild  Post-op Assessment: Post-op Vital signs reviewed  Post-op Vital Signs: Reviewed  Complications: No apparent anesthesia complications

## 2012-06-06 NOTE — H&P (Signed)
[name] is an 71 y.o. female.   Chief Complaint: painful hand HPI:  The patient is a pleasant 71 yo female with a hx of significant rheumatoid arthritis who presents for surgical fusion to the right ring finger and small finger PIP joints due to chronic pain and deformity. All questions have been encouraged and answered preoperatively.  The patient is followed medically by Dr. Algie Coffer. She has underwent multiple hand procedures in the past without complicating features.   Past Medical History  Diagnosis Date  . GERD (gastroesophageal reflux disease)   . Cataracts, bilateral     hx of  . Headache   . Chronic back pain   . Arthritis     RA, sees Dr.  Ethelene Hal    Past Surgical History  Procedure Date  . Eye surgery     cataract bilateral  . Hand surgery   . Fracture surgery     under right eye  . Foot surgery     History reviewed. No pertinent family history. Social History:  reports that she has never smoked. She does not have any smokeless tobacco history on file. She reports that she does not drink alcohol or use illicit drugs.  Allergies: No Known Allergies  Medications Prior to Admission  Medication Sig Dispense Refill  . cycloSPORINE (RESTASIS) 0.05 % ophthalmic emulsion Place 1 drop into both eyes 2 (two) times daily.      Marland Kitchen HYDROcodone-acetaminophen (NORCO/VICODIN) 5-325 MG per tablet Take 1 tablet by mouth 2 (two) times daily.      . hydroxychloroquine (PLAQUENIL) 200 MG tablet Take 200 mg by mouth daily.      . megestrol (MEGACE) 40 MG tablet Take 40 mg by mouth 2 (two) times daily.      Marland Kitchen omeprazole (PRILOSEC) 20 MG capsule Take 20 mg by mouth daily.        No results found for this or any previous visit (from the past 48 hour(s)). No results found.  Review of Systems  Constitutional: Positive for malaise/fatigue.  HENT: Negative.   Eyes: Negative.   Respiratory: Negative.   Cardiovascular: Negative.   Gastrointestinal: Negative.   Genitourinary:  Negative.   Musculoskeletal: Positive for myalgias.  Skin: Negative.   Neurological: Positive for weakness.  Endo/Heme/Allergies: Negative.   Psychiatric/Behavioral: Negative.     Blood pressure 149/80, pulse 69, temperature 97.9 F (36.6 C), temperature source Oral, resp. rate 18, weight 40.3 kg (88 lb 13.5 oz), SpO2 97.00%. Physical Exam : Patient is pleasant,NAD Head: atraumatic, normocephalic Chest: CTAB Heart: S1S2 Abdomen: NT RUE: pain and deformity present about the Right ring and small finger, well healed incisions about the index and middle finger, neurovascularly intact  Assessment/Plan Rheumatoid arthritis with chronic deformity and pain affecting the right small and ring fingers GERD Headaches Chronic back pain .Marland KitchenWe are planning surgery for your upper extremity. The risk and benefits of surgery include risk of bleeding infection anesthesia damage to normal structures and failure of the surgery to accomplish its intended goals of relieving symptoms and restoring function with this in mind we'll going to proceed. I have specifically discussed with the patient the pre-and postoperative regime and the does and don'ts and risk and benefits in great detail. Risk and benefits of surgery also include risk of dystrophy chronic nerve pain failure of the healing process to go onto completion and other inherent risks of surgery The relavent the pathophysiology of the disease/injury process, as well as the alternatives for treatment and postoperative course  of action has been discussed in great detail with the patient who desires to proceed.  We will do everything in our power to help you (the patient) restore function to the upper extremity. Is a pleasure to see this patient today.

## 2012-06-06 NOTE — Anesthesia Preprocedure Evaluation (Addendum)
Anesthesia Evaluation  Patient identified by MRN, date of birth, ID band Patient awake    Reviewed: Allergy & Precautions, H&P , NPO status , Patient's Chart, lab work & pertinent test results  Airway Mallampati: I TM Distance: >3 FB     Dental  (+) Edentulous Upper, Dental Advisory Given and Poor Dentition   Pulmonary  breath sounds clear to auscultation        Cardiovascular Rhythm:Regular Rate:Normal     Neuro/Psych  Headaches,    GI/Hepatic GERD-  Medicated and Controlled,  Endo/Other    Renal/GU      Musculoskeletal   Abdominal   Peds  Hematology   Anesthesia Other Findings   Reproductive/Obstetrics                          Anesthesia Physical Anesthesia Plan  ASA: II  Anesthesia Plan: General   Post-op Pain Management:    Induction: Intravenous  Airway Management Planned: LMA  Additional Equipment:   Intra-op Plan:   Post-operative Plan: Extubation in OR  Informed Consent: I have reviewed the patients History and Physical, chart, labs and discussed the procedure including the risks, benefits and alternatives for the proposed anesthesia with the patient or authorized representative who has indicated his/her understanding and acceptance.   Dental advisory given  Plan Discussed with: CRNA, Anesthesiologist and Surgeon  Anesthesia Plan Comments:         Anesthesia Quick Evaluation

## 2012-06-06 NOTE — Transfer of Care (Signed)
Immediate Anesthesia Transfer of Care Note  Patient: [name]  Procedure(s) Performed: Procedure(s) (LRB): ARTHRODESIS FINGER (Right)  Patient Location: PACU  Anesthesia Type: General  Level of Consciousness: awake  Airway & Oxygen Therapy: Patient Spontanous Breathing and Patient connected to nasal cannula oxygen  Post-op Assessment: Report given to PACU RN and Post -op Vital signs reviewed and stable  Post vital signs: Reviewed and stable  Complications: No apparent anesthesia complications

## 2012-06-06 NOTE — Progress Notes (Signed)
In holding patient reports no cash, walker and two belongings bags with labels on them taken to PACU

## 2012-06-06 NOTE — Addendum Note (Signed)
Addendum  created 06/06/12 2223 by Kerby Nora, MD   Modules edited:Notes Section

## 2012-06-07 ENCOUNTER — Encounter (HOSPITAL_COMMUNITY): Payer: Self-pay | Admitting: Orthopedic Surgery

## 2012-06-07 MED ORDER — ZOLPIDEM TARTRATE 5 MG PO TABS: 5.0000 mg | ORAL_TABLET | Freq: Every evening | ORAL | Status: DC | PRN

## 2012-06-07 NOTE — Progress Notes (Signed)
Subjective: 1 Day Post-Op Procedure(s) (LRB): ARTHRODESIS FINGER (Right) Patient reports pain as mild.   Overall looks good  Objective: Vital signs in last 24 hours: Temp:  [97.4 F (36.3 C)-98.8 F (37.1 C)] 97.6 F (36.4 C) (08/07 0508) Pulse Rate:  [62-87] 68  (08/07 1117) Resp:  [16-30] 16  (08/07 0508) BP: (101-149)/(60-80) 101/66 mmHg (08/07 1117) SpO2:  [97 %-100 %] 98 % (08/07 1117) Weight:  [40.3 kg (88 lb 13.5 oz)] 40.3 kg (88 lb 13.5 oz) (08/06 1313)  Intake/Output from previous day: 08/06 0701 - 08/07 0700 In: 1815 [P.O.:240; I.V.:1550] Out: 200 [Urine:200] Intake/Output this shift:    No results found for this basename: HGB:5 in the last 72 hours No results found for this basename: WBC:2,RBC:2,HCT:2,PLT:2 in the last 72 hours No results found for this basename: NA:2,K:2,CL:2,CO2:2,BUN:2,CREATININE:2,GLUCOSE:2,CALCIUM:2 in the last 72 hours No results found for this basename: LABPT:2,INR:2 in the last 72 hours  Neurologically intact ABD soft Neurovascular intact Sensation intact distally Intact pulses distally No cellulitis present Compartment soft  Assessment/Plan: 1 Day Post-Op Procedure(s) (LRB): ARTHRODESIS FINGER (Right) Advance diet Plan for discharge tomorrow D/wnursing staff and DC planners [place] home tommorrow  [name] 06/07/2012, 12:28 PM

## 2012-06-07 NOTE — Progress Notes (Signed)
PT EVALUATION NOTE:   06/07/12 1000  PT Visit Information  Last PT Received On 06/07/12  Assistance Needed +1  PT/OT Co-Evaluation/Treatment Yes  PT Time Calculation  PT Start Time 1039  PT Stop Time 1112  PT Time Calculation (min) 33 min  Subjective Data  Subjective My husband is not with me anymore.    Patient Stated Goal None stated.    Precautions  Precautions Fall  Precaution Comments Pt is a frail elderly female who lives alone.  The amount of support available is unclear.  As pt is very had to understand.     Required Braces or Orthoses Other Brace/Splint (Splint on R UE. )  Restrictions  Weight Bearing Restrictions Yes  RUE Weight Bearing NWB  Other Position/Activity Restrictions elevate R UE whenever possible.   Home Living  Lives With Alone  Available Help at Discharge Family;Available PRN/intermittently  Type of Home Apartment  Home Access Stairs to enter  Entrance Stairs-Number of Steps 7  Home Layout One level  Bathroom Shower/Tub Tub/shower unit;Curtain  Bathroom Toilet Handicapped height  Bathroom Accessibility Yes  How Accessible Accessible via wheelchair;Accessible via Immunologist chair with back;Walker - rolling;Straight cane (Platform walker per pt. )  Prior Function  Level of Independence Independent with assistive device(s)  Able to Take Stairs? Yes  Driving No  Communication  Communication Expressive difficulties;Prefers language other than English  Cognition  Overall Cognitive Status Appears within functional limits for tasks assessed/performed  Arousal/Alertness Awake/alert  Orientation Level Appears intact for tasks assessed  Behavior During Session Restless  Right Upper Extremity Assessment  RUE ROM/Strength/Tone Unable to fully assess  Right Lower Extremity Assessment  RLE ROM/Strength/Tone Deficits  RLE ROM/Strength/Tone Deficits Generalized weakness in bilateral LEs 3+/5 grossly.    RLE Sensation WFL - Light  Touch;WFL - Proprioception  RLE Coordination WFL - gross motor  Left Lower Extremity Assessment  LLE ROM/Strength/Tone Deficits  LLE ROM/Strength/Tone Deficits Generalized weakness in bilateral LEs.   LLE Sensation WFL - Proprioception;WFL - Light Touch  LLE Coordination WFL - gross motor  Trunk Assessment  Trunk Assessment Kyphotic;Other exceptions (scoliosis.)  Bed Mobility  Bed Mobility Supine to Sit  Supine to Sit 5: Supervision  Sitting - Scoot to Edge of Bed 6: Modified independent (Device/Increase time)  Transfers  Sit to Stand 4: Min assist  Sit to Stand: Patient Percentage 80%  Stand to Sit 4: Min assist  Stand to Sit: Patient Percentage 80%  Details for Transfer Assistance assist to initiate standing and control descent to sit secondary to generalized weakness.   Ambulation/Gait  Ambulation/Gait Assistance 4: Min assist  Ambulation Distance (Feet) 15 Feet  Assistive device 1 person hand held assist  Ambulation/Gait Assistance Details pt presents with flexed trunk posture and weakness in bilateral LEs.  Pt unsteady when ambulating and unable to use walker secondary to NWB in R UE.    Gait Pattern Step-to pattern;Trunk flexed;Narrow base of support;Festinating  Stairs No  Engineering geologist No  Modified Rankin (Stroke Patients Only)  Pre-Morbid Rankin Score 2  Balance  Balance Assessed Yes  Static Sitting Balance  Static Sitting - Balance Support Bilateral upper extremity supported;Feet supported  Static Sitting - Level of Assistance 5: Stand by assistance  Static Sitting - Comment/# of Minutes Pt sat on EOB for > 10 min while performing ADLs with constant c/o dizziness and headache.    PT - End of Session  Equipment Utilized During Treatment Gait belt;Other (comment) (  R platform RW)  Activity Tolerance Treatment limited secondary to medical complications (Comment)  Patient left in chair;with call bell/phone within reach  Nurse Communication  Mobility status  PT Assessment  Clinical Impression Statement The patient is a pleasant 71 yo female with a hx of significant rheumatoid arthritis who presents for surgical fusion to the right ring finger and small finger PIP joints due to chronic pain and deformity.  Pt is a frail female who has had multiple falls in past 3 years .  Likely pt will need to consider SNF placement for pt's safety. Will touch base with case worker about home situation.    PT Recommendation/Assessment Patient needs continued PT services  PT Problem List Decreased strength;Decreased activity tolerance;Decreased mobility;Pain;Decreased knowledge of use of DME  Barriers to Discharge Inaccessible home environment;Decreased caregiver support  Barriers to Discharge Comments Unclear if pt will have familial support at home. Pt has several steps to negotiate to enter/exit her home and is unlikely to be able to negotiate stairs and manage platform walker.   PT Therapy Diagnosis  Abnormality of gait;Generalized weakness;Acute pain  PT Plan  PT Frequency Min 4X/week  PT Treatment/Interventions DME instruction;Gait training;Functional mobility training;Therapeutic activities;Therapeutic exercise;Patient/family education;Neuromuscular re-education;Stair training  PT Recommendation  Recommendations for Other Services Rehab consult  Follow Up Recommendations Inpatient Rehab;Supervision/Assistance - 24 hour  Equipment Recommended None recommended by PT  Individuals Consulted  Consulted and Agree with Results and Recommendations Patient  Acute Rehab PT Goals  PT Goal Formulation With patient  Time For Goal Achievement 06/14/12  Potential to Achieve Goals Fair  Pt will go Supine/Side to Sit Independently;with HOB 0 degrees  PT Goal: Supine/Side to Sit - Progress Goal set today  Pt will go Sit to Supine/Side Independently;with HOB 0 degrees  PT Goal: Sit to Supine/Side - Progress Goal set today  Pt will Transfer Bed to Chair/Chair  to Bed with modified independence  PT Transfer Goal: Bed to Chair/Chair to Bed - Progress Goal set today  Pt will Ambulate 16 - 50 feet;with modified independence;with least restrictive assistive device  PT Goal: Ambulate - Progress Goal set today  Pt will Go Up / Down Stairs 6-9 stairs;with modified independence;with least restrictive assistive device  PT Goal: Up/Down Stairs - Progress Goal set today  PT G-Codes **NOT FOR INPATIENT CLASS**  Functional Assessment Tool Used clinical judgement  Functional Limitation Mobility: Walking and moving around  Mobility: Walking and Moving Around Current Status (Z6109) CJ  Mobility: Walking and Moving Around Goal Status (U0454) CI  PT General Charges  $$ ACUTE PT VISIT 1 Procedure  PT Evaluation  $Initial PT Evaluation Tier II 1 Procedure  PT Treatments  $Therapeutic Activity 8-22 mins  Written Expression  Dominant Hand Right    Gabrian Hoque L. Myrlene Riera DPT [phone]

## 2012-06-07 NOTE — Evaluation (Signed)
Occupational Therapy Evaluation Patient Details Name: [name] MRN: [id number] DOB: 04/17/1941 Today's Date: 06/07/2012 Time: 4098-1191 OT Time Calculation (min): 27 min  OT Assessment / Plan / Recommendation Clinical Impression  This 71 yr old female s/p right ring finger and right small finger PIP fusion.  Pt. demonstrates dizziness with activity this morning, and requires minA for balance. Eval limited by dizziness.  Pt. instructed to keep Rt. UE elevated.  Pt. will benefit from OT to maximize safety and independence with BADLs.  Recommend HHOT, and 24 hour assist, if possible initially after discharge      OT Assessment  Patient needs continued OT Services    Follow Up Recommendations  Home health OT;Supervision/Assistance - 24 hour    Barriers to Discharge Decreased caregiver support    Equipment Recommendations  None recommended by OT    Recommendations for Other Services    Frequency  Min 2X/week    Precautions / Restrictions Precautions Precautions: Fall Precaution Comments: Pt is a frail elderly female who lives alone.  The amount of support available is unclear.  As pt is very had to understand.    Required Braces or Orthoses: Other Brace/Splint Other Brace/Splint: Post op splint and bulky dressing Rt. UE       ADL  Eating/Feeding: Simulated;Set up;Performed Where Assessed - Eating/Feeding: Chair Grooming: Simulated;Wash/dry hands;Wash/dry face;Minimal assistance Where Assessed - Grooming: Supported standing Upper Body Bathing: Simulated;Minimal assistance Where Assessed - Upper Body Bathing: Unsupported sitting Lower Body Bathing: Simulated;Minimal assistance Where Assessed - Lower Body Bathing: Supported sit to stand Upper Body Dressing: Simulated;Minimal assistance Where Assessed - Upper Body Dressing: Unsupported sitting Lower Body Dressing: Simulated;Performed;Minimal assistance (able to don/doff socks EOB) Where Assessed - Lower Body Dressing:  Supported sit to Pharmacist, hospital: Performed;Minimal Dentist Method: Sit to Barista: Comfort height toilet Toileting - Clothing Manipulation and Hygiene: Performed;Minimal assistance Where Assessed - Toileting Clothing Manipulation and Hygiene: Standing Transfers/Ambulation Related to ADLs: Pt. requires min A for ambulation.  She reports she uses a RW at home,and indicates she has a platform ADL Comments: Pt. with severe scoliosis and arthritic deformity bil. hands.  Pt. requires min A for balance with BADLs.  Pt. with complaint of dizziness when sitting EOB.  Pt. moved to recliner, and BP 101/66.  RN notified.  Unsure of family support at discharge    OT Diagnosis: Generalized weakness;Acute pain  OT Problem List: Decreased strength;Decreased range of motion;Decreased activity tolerance;Impaired balance (sitting and/or standing);Decreased knowledge of use of DME or AE;Impaired UE functional use;Pain OT Treatment Interventions: Self-care/ADL training;DME and/or AE instruction;Therapeutic activities;Patient/family education;Balance training   OT Goals Acute Rehab OT Goals OT Goal Formulation: With patient Time For Goal Achievement: 06/14/12 Potential to Achieve Goals: Good ADL Goals Pt Will Perform Grooming: with supervision;Standing at sink;Supported ADL Goal: Grooming - Progress: Goal set today Pt Will Perform Upper Body Bathing: with set-up;Sitting, chair ADL Goal: Upper Body Bathing - Progress: Goal set today Pt Will Perform Lower Body Bathing: with supervision;Sit to stand from chair;Sit to stand from bed ADL Goal: Lower Body Bathing - Progress: Goal set today Pt Will Perform Upper Body Dressing: with supervision;Sitting, chair;Sitting, bed ADL Goal: Upper Body Dressing - Progress: Goal set today Pt Will Perform Lower Body Dressing: with supervision;Sit to stand from chair;Sit to stand from bed ADL Goal: Lower Body Dressing - Progress:  Goal set today Pt Will Transfer to Toilet: with supervision;Ambulation;Comfort height toilet ADL Goal: Toilet Transfer - Progress: Goal set  today Pt Will Perform Toileting - Clothing Manipulation: with supervision;Standing ADL Goal: Toileting - Clothing Manipulation - Progress: Goal set today Pt Will Perform Toileting - Hygiene: with supervision;Sit to stand from 3-in-1/toilet ADL Goal: Toileting - Hygiene - Progress: Goal set today Pt Will Perform Tub/Shower Transfer: with supervision;Shower seat with back;Ambulation ADL Goal: Tub/Shower Transfer - Progress: Goal set today  Visit Information  Last OT Received On: 06/07/12 Assistance Needed: +1 PT/OT Co-Evaluation/Treatment: Yes    Subjective Data  Subjective: "I feel dizzy" Patient Stated Goal: Did not state   Prior Functioning  Vision/Perception  Home Living Lives With: Alone Available Help at Discharge: Family;Available PRN/intermittently Type of Home: Apartment Home Access: Stairs to enter Entrance Stairs-Number of Steps: 7 Home Layout: One level Bathroom Shower/Tub: Forensic scientist: Handicapped height Bathroom Accessibility: Yes How Accessible: Accessible via walker Home Adaptive Equipment: Shower chair with back Prior Function Level of Independence: Independent with assistive device(s) Able to Take Stairs?: Yes Driving: No Comments: Pt reports son lives in [place] Communication Communication: Prefers language other than English (Pt. with strong accent, making it difficult to understand he) Dominant Hand: Right      Cognition  Overall Cognitive Status: Appears within functional limits for tasks assessed/performed Arousal/Alertness: Awake/alert Orientation Level: Appears intact for tasks assessed    Extremity/Trunk Assessment Right Upper Extremity Assessment RUE ROM/Strength/Tone: Deficits RUE ROM/Strength/Tone Deficits: Shoulder and elbow WFL.  wrist and hand unable to fully asses due  to splint and bulky dressing.  Pt. with limited AROm digits 1 and 2.  Swan neck and boutonierre deformities noted RUE Coordination: Deficits Left Upper Extremity Assessment LUE ROM/Strength/Tone:  (at baseline - pt. with arthritic deformity) LUE Coordination:  (at baseline - pt with arthritic deformity) Trunk Assessment Trunk Assessment: Other exceptions (scoliosis)   Mobility Bed Mobility Bed Mobility: Supine to Sit;Sitting - Scoot to Edge of Bed Supine to Sit: 5: Supervision;HOB elevated Sitting - Scoot to Edge of Bed: 6: Modified independent (Device/Increase time) Transfers Transfers: Sit to Stand;Stand to Sit Sit to Stand: 4: Min assist;With upper extremity assist;From bed;From chair/3-in-1;From toilet Stand to Sit: 4: Min assist;With upper extremity assist;To chair/3-in-1;To bed;To toilet   Exercise    Balance    End of Session OT - End of Session Activity Tolerance: Other (comment) (Dizziness) Patient left: in chair;with call bell/phone within reach Nurse Communication: Other (comment) (dizziness)  GO Functional Assessment Tool Used: CJ Functional Limitation: Self care Self Care Current Status (W0981): At least 20 percent but less than 40 percent impaired, limited or restricted Self Care Goal Status (X9147): At least 1 percent but less than 20 percent impaired, limited or restricted   Parlee Amescua M 06/07/2012, 3:33 PM

## 2012-06-07 NOTE — Op Note (Signed)
[name], [name]             ACCOUNT NO.:  [id number]  MEDICAL RECORD NO.:  [id number]  LOCATION:  [id number]                        FACILITY:  [facility]  PHYSICIAN:  [name], Veronica MoranDATE OF BIRTH:  21-Dec-1940  DATE OF PROCEDURE:  06/06/2012 DATE OF DISCHARGE:                              OPERATIVE REPORT   PREOPERATIVE DIAGNOSIS:  Chronic rheumatoid disease with deformity about the right small and ring finger.  POSTOPERATIVE DIAGNOSIS:  Chronic rheumatoid disease with deformity about the right small and ring finger.  PROCEDURE: 1. Right ring finger PIP fusion with tension band wire construct. 2. Extensor lengthening right ring finger. 3. Right small finger PIP (proximal interphalangeal joint) fusion with     a tension band wire construct. 4. Extensor lengthening right small finger. 5. Stress radiography.  SURGEON:  [name], M.D.  ASSISTANT:  Karie Chimera, P.A.-C.  ANESTHESIA:  General.  TOURNIQUET TIME:  Less than an hour.  ESTIMATED BLOOD LOSS:  Minimal.  INDICATIONS FOR THE PROCEDURE:  This is a 71 year old female who I know quite well from prior rheumatoid reconstruction.  She presented for the above-mentioned measures.  She understands the risks and benefits and goals of surgery given the fact that she has had previous fusions about the index and middle finger.  With all issues in mind, she desires to proceed.  All questions have been encouraged and answered preoperatively.  OPERATIVE PROCEDURE:  The patient was seen by myself and Anesthesia, taken to the operative suite, time-out called.  Prepped and draped in usual sterile fashion, Betadine scrub and paint.  General anesthesia induced.  Final time-out and pre and postop check list was complete, and the patient then underwent a very careful and cautious approach about the ring and small finger.  This was a dorsal curvilinear incision. Skin flap was elevated.  The extensor tendon was incised in  midline and the triangular ligament released.  This was done to perform a lengthening.  I did not suture back the triangular ligaments on either side and the midline incisions were both performed on both regions.  Following extensor lengthening about the small and ring finger, joint was accessed, collateral ligaments recessed, capitellum preparation about both the small and ring finger, proximal phalanx, and middle phalanx, surfaces of the PIP joint was accomplished.  Following this, I then placed a 22-gauge wire across the middle phalanx transversely and then placed two 0.045 K-wires down the ring finger from proximal to distal engaging the proximal phalanx and then going into the middle phalanx.  Tension band wire was then placed around these two 0.45 K- wires.  Construct was tightened down to my satisfaction.  Wires bent against the bone and the extensor apparatus then closed in a lengthened state with Vicryl.  X-rays looked excellent.  Following this, similar procedure was performed at the small finger, however, 0.035 K-wires were used as opposed to 0.045 K-wires due to the caliber of the bone, which was small.  This was prepared with the cup and cone preparation. Following this, the patient then underwent very careful and cautious tension band placement with tightening without difficulty.  Once this was tightened, I was able to achieve coaxial compression of cancellous surfaces  for fusion.  AP and lateral x-rays were verified about the small and ring finger and all looked quite well.  We were pleased with the findings.  Following this, we irrigated, closed the extensor apparatus with Vicryl in the length and stayed about the small finger, deflated the tourniquet, secured hemostasis, and closed the dorsal incisions with Prolene.  Adaptic, Xeroform, and gauze followed by Webril, Kerlix, and a volar splint was applied.  The patient tolerated this well.  She was taken to recovery room  in stable condition.  All sponge, needle, and instrument counts were reported as correct.  She will be admitted, we will monitor condition closely, and discharge her once stable.  IV antibiotics and general pain medicines postoperatively will be used as well as regular diet.     [name], M.D.     [facility]  D:  06/06/2012  T:  06/07/2012  Job:  [id number]

## 2012-06-07 NOTE — Progress Notes (Signed)
CARE MANAGEMENT NOTE 06/07/2012  Patient:  [name], [name]   Account Number:  [id number]  Date Initiated:  06/07/2012  Documentation initiated by:  Vance Peper  Subjective/Objective Assessment:   71 yr old female s/p right ring finger and right small finger PIP fusion     Action/Plan:   CM contacted patient's son -[name] - [phone], regarding mom's needs at discharge. Choice offered. Per Dr. Amanda Pea patient is self sufficicent and will do alright.   Anticipated DC Date:  06/08/2012   Anticipated DC Plan:  HOME W HOME HEALTH SERVICES      DC Planning Services  CM consult      [facility] Choice  HOME HEALTH   Choice offered to / List presented to:  C-4 Adult Children        HH arranged  HH-2 PT  HH-3 OT      [facility] agency  [facility]   Status of service:  Completed, signed off Medicare Important Message given?   (If response is "NO", the following Medicare IM given date fields will be blank) Date Medicare IM given:   Date Additional Medicare IM given:    Discharge Disposition:  HOME W HOME HEALTH SERVICES  Per UR Regulation:    If discussed at Long Length of Stay Meetings, dates discussed:    Comments:

## 2012-06-08 MED ORDER — HYDROCODONE-ACETAMINOPHEN 5-325 MG PO TABS: 1.0000 | ORAL_TABLET | Freq: Two times a day (BID) | ORAL | Status: AC

## 2012-06-08 NOTE — Discharge Summary (Signed)
Physician Discharge Summary  Patient ID: [name] MRN: [id number] DOB/AGE: 73/07/1941 71 y.o.  Admit date: 06/06/2012 Discharge date: 06/08/2012  Admission Diagnoses: Rheumatoid arthritis  Discharge Diagnoses: Rheumatoid arthritis  Status post surgery for right small and ring finger PIP joints  Active Problems:  * No active hospital problems. *    Discharged Condition:    Hospital Course: Patient was admitted overnight for IV antibiotics pain management other issues. She matriculation well throughout her postoperative course. She is available and ready for discharge today she is awake alert and oriented has no complicating features or problems. We have requested home therapy for her chest for activities of daily living and recommend elevation keeping the area clean and dry. She had no immediate complications with her surgical postop recovery.  Consults: None    Treatments: surgery: Patient underwent surgery for repair of her ring and small finger PIP joints with fusion. This was performed 06/06/2012 without complications.  Discharge Exam: Blood pressure 90/57, pulse 91, temperature 99 F (37.2 C), temperature source Oral, resp. rate 16, weight 40.3 kg (88 lb 13.5 oz), SpO2 100.00%. General appearance: alert and cooperative..The patient is alert and oriented in no acute distress the patient complains of pain in the affected upper extremity. The patient is noted to have a normal HEENT exam. Lung fields show equal chest expansion and no shortness of breath abdomen exam is nontender without distention. Lower extremity examination does not show any fracture dislocation or blood clot symptoms. Pelvis is stable neck and back are stable and nontender  Disposition:    Medication List  As of 06/08/2012  9:41 AM   ASK your doctor about these medications         cycloSPORINE 0.05 % ophthalmic emulsion   Commonly known as: RESTASIS   Place 1 drop into both eyes 2 (two) times daily.      HYDROcodone-acetaminophen 5-325 MG per tablet   Commonly known as: NORCO/VICODIN   Take 1 tablet by mouth 2 (two) times daily.      hydroxychloroquine 200 MG tablet   Commonly known as: PLAQUENIL   Take 200 mg by mouth daily.      megestrol 40 MG tablet   Commonly known as: MEGACE   Take 40 mg by mouth 2 (two) times daily.      omeprazole 20 MG capsule   Commonly known as: PRILOSEC   Take 20 mg by mouth daily.           Follow-up Information    Follow up with Karen Chafe, MD. (Please come to the office August 19 at 11 AM)    Contact information:   [address] [place] [address] [phone]          Signed: Karen Chafe 06/08/2012, 9:41 AM

## 2012-06-08 NOTE — Progress Notes (Signed)
CARE MANAGEMENT NOTE 06/08/2012  Patient:  [name], [name]   Account Number:  [id number]  Date Initiated:  06/07/2012  Documentation initiated by:  Vance Peper  Subjective/Objective Assessment:   71 yr old female s/p right ring finger and right small finger PIP fusion     Action/Plan:   CM contacted patient's son -Mitra Duling - [phone], regarding mom's needs at discharge. Choice offered. Per Dr. Amanda Pea patient is self sufficicent and will do alright.   Anticipated DC Date:  06/08/2012   Anticipated DC Plan:  HOME W HOME HEALTH SERVICES      DC Planning Services  CM consult      [facility] Choice  HOME HEALTH   Choice offered to / List presented to:  C-4 Adult Children   DME arranged  WALKER - PLATFORM      DME agency  [facility]     HH arranged  HH-2 PT  HH-3 OT  HH-1 RN  HH-4 NURSE'S AIDE  HH-6 SOCIAL WORKER      HH agency  [facility]   Status of service:  Completed, signed off Medicare Important Message given?   (If response is "NO", the following Medicare IM given date fields will be blank) Date Medicare IM given:   Date Additional Medicare IM given:    Discharge Disposition:  HOME W HOME HEALTH SERVICES  Per UR Regulation:    If discussed at Long Length of Stay Meetings, dates discussed:    Comments:

## 2012-06-08 NOTE — Progress Notes (Signed)
Occupational Therapy Treatment Patient Details Name: [name] MRN: [id number] DOB: Sep 14, 1941 Today's Date: 06/08/2012 Time: 7829-5621 OT Time Calculation (min): 49 min  OT Assessment / Plan / Recommendation Comments on Treatment Session Pt. is moving much better today, and requires supervision for BADLs.  I am however, concerned about pt's ability to perform IADLs at home.  Pt reports she has no supports for this as her friend who assists her is out of the state for a month.  Recommend 24 hour assist at discharge    Follow Up Recommendations  Home health OT;Supervision/Assistance - 24 hour    Barriers to Discharge       Equipment Recommendations  None recommended by OT    Recommendations for Other Services    Frequency Min 2X/week   Plan Discharge plan remains appropriate    Precautions / Restrictions Precautions Precautions: Fall Required Braces or Orthoses: Other Brace/Splint Other Brace/Splint: Post op splint and bulky dressing Rt. UE Restrictions Weight Bearing Restrictions: Yes RUE Weight Bearing: Non weight bearing Other Position/Activity Restrictions: elevate R UE whenever possible.    Pertinent Vitals/Pain     ADL  Grooming: Simulated;Wash/dry hands;Wash/dry face;Teeth care;Supervision/safety Where Assessed - Grooming: Supported standing Upper Body Bathing: Simulated;Set up Where Assessed - Upper Body Bathing: Unsupported sitting Lower Body Bathing: Simulated;Supervision/safety Where Assessed - Lower Body Bathing: Supported sit to stand Upper Body Dressing: Performed;Minimal assistance (for buttons) Where Assessed - Upper Body Dressing: Unsupported sitting Lower Body Dressing: Performed;Supervision/safety Where Assessed - Lower Body Dressing: Supported sit to stand Toilet Transfer: Research scientist (life sciences) Method: Sit to Barista: Comfort height toilet Toileting - Architect and Hygiene:  Performed;Supervision/safety Where Assessed - Engineer, mining and Hygiene: Standing Equipment Used: Rolling walker (with Rt. platform) Transfers/Ambulation Related to ADLs: Pt ambulating in room with supervision usint RW with Rt. platform ADL Comments: Pt. did well with BADLs.  She is unable to fasten buttons/fasters, but reports she will wear items without fasteners once she is discharged.  Pt. attempted to don pants in standing position - instructed her to perform in seated position for her safety and to reduce risk of fall - she agreed.  Pt. indicates that her friend who helps her with grocery shopping is in [place] for 1 month, and she has no other supports.  When pt. asked how she would perform cooking and meal prep, she indicates that her doctor has ordered someone to come to her house 2 hours a day to help.  Informed pt. this is likely not an option.  She indicates to me that she has no one who can assist with grocery shopping and meal prep.  Spoke with CM and HH liason.  Recommend HHaid, and SW.   Optimally feel pt. would be best served with 24 hour assist at discharge, and/or SNF (Pt. was instructed in importance of elevating Rt. UE)    OT Diagnosis:    OT Problem List:   OT Treatment Interventions:     OT Goals ADL Goals ADL Goal: Grooming - Progress: Progressing toward goals ADL Goal: Upper Body Bathing - Progress: Progressing toward goals ADL Goal: Lower Body Bathing - Progress: Progressing toward goals ADL Goal: Upper Body Dressing - Progress: Progressing toward goals ADL Goal: Lower Body Dressing - Progress: Progressing toward goals ADL Goal: Toilet Transfer - Progress: Progressing toward goals ADL Goal: Toileting - Clothing Manipulation - Progress: Progressing toward goals ADL Goal: Toileting - Hygiene - Progress: Progressing toward goals ADL Goal: Tub/Shower Transfer -  Progress: Progressing toward goals  Visit Information  Last OT Received On: 06/08/12 Assistance  Needed: +1    Subjective Data      Prior Functioning       Cognition  Overall Cognitive Status: Appears within functional limits for tasks assessed/performed Arousal/Alertness: Awake/alert Orientation Level: Appears intact for tasks assessed Behavior During Session: [facility] for tasks performed    Mobility Transfers Transfers: Sit to Stand Sit to Stand: 5: Supervision;From bed;With upper extremity assist;From chair/3-in-1 Stand to Sit: 5: Supervision;With upper extremity assist;To bed   Exercises    Balance    End of Session OT - End of Session Activity Tolerance: Patient tolerated treatment well Patient left: in chair;with call bell/phone within reach;with nursing in room Nurse Communication: Other (comment) (Need to remove TEDs prior to d/c.  call PACU for RW)  GO Other OT Primary Discharge Status [phone]): At least 1 percent but less than 20 percent impaired, limited or restricted   Jahzeel Poythress M 06/08/2012, 1:25 PM

## 2012-10-31 ENCOUNTER — Other Ambulatory Visit: Payer: Self-pay | Admitting: Internal Medicine

## 2012-10-31 DIAGNOSIS — Z1231 Encounter for screening mammogram for malignant neoplasm of breast: Secondary | ICD-10-CM

## 2012-11-29 ENCOUNTER — Ambulatory Visit: Payer: Medicare Other

## 2012-12-22 ENCOUNTER — Ambulatory Visit
Admission: RE | Admit: 2012-12-22 | Discharge: 2012-12-22 | Disposition: A | Discharge status: Home / Self Care | Payer: PRIVATE HEALTH INSURANCE | Source: Ambulatory Visit | Attending: Internal Medicine | Admitting: Internal Medicine

## 2012-12-22 DIAGNOSIS — Z1231 Encounter for screening mammogram for malignant neoplasm of breast: Secondary | ICD-10-CM

## 2013-03-07 ENCOUNTER — Other Ambulatory Visit: Payer: Self-pay | Admitting: Gastroenterology

## 2013-03-07 DIAGNOSIS — R634 Abnormal weight loss: Secondary | ICD-10-CM

## 2013-03-21 ENCOUNTER — Other Ambulatory Visit: Payer: PRIVATE HEALTH INSURANCE

## 2013-03-27 ENCOUNTER — Other Ambulatory Visit: Payer: PRIVATE HEALTH INSURANCE

## 2013-03-29 ENCOUNTER — Other Ambulatory Visit: Payer: PRIVATE HEALTH INSURANCE

## 2013-04-18 ENCOUNTER — Encounter (INDEPENDENT_AMBULATORY_CARE_PROVIDER_SITE_OTHER): Payer: Medicare Other | Admitting: Ophthalmology

## 2013-04-18 DIAGNOSIS — H43819 Vitreous degeneration, unspecified eye: Secondary | ICD-10-CM

## 2013-04-18 DIAGNOSIS — H3530 Unspecified macular degeneration: Secondary | ICD-10-CM

## 2013-04-18 DIAGNOSIS — Z09 Encounter for follow-up examination after completed treatment for conditions other than malignant neoplasm: Secondary | ICD-10-CM

## 2013-08-01 ENCOUNTER — Ambulatory Visit
Admission: RE | Admit: 2013-08-01 | Discharge: 2013-08-01 | Disposition: A | Discharge status: Home / Self Care | Payer: No Typology Code available for payment source | Source: Ambulatory Visit | Attending: Infectious Diseases | Admitting: Infectious Diseases

## 2013-08-01 ENCOUNTER — Other Ambulatory Visit: Payer: Self-pay | Admitting: Infectious Diseases

## 2013-08-01 DIAGNOSIS — R7611 Nonspecific reaction to tuberculin skin test without active tuberculosis: Secondary | ICD-10-CM

## 2013-10-01 ENCOUNTER — Encounter (HOSPITAL_COMMUNITY): Payer: Self-pay | Admitting: Emergency Medicine

## 2013-10-01 ENCOUNTER — Emergency Department (HOSPITAL_COMMUNITY)
Admission: EM | Admit: 2013-10-01 | Discharge: 2013-10-01 | Disposition: A | Discharge status: Home / Self Care | Payer: PRIVATE HEALTH INSURANCE | Attending: Emergency Medicine | Admitting: Emergency Medicine

## 2013-10-01 ENCOUNTER — Emergency Department (HOSPITAL_COMMUNITY): Payer: PRIVATE HEALTH INSURANCE

## 2013-10-01 DIAGNOSIS — Z8611 Personal history of tuberculosis: Secondary | ICD-10-CM | POA: Insufficient documentation

## 2013-10-01 DIAGNOSIS — M129 Arthropathy, unspecified: Secondary | ICD-10-CM | POA: Insufficient documentation

## 2013-10-01 DIAGNOSIS — M4120 Other idiopathic scoliosis, site unspecified: Secondary | ICD-10-CM | POA: Insufficient documentation

## 2013-10-01 DIAGNOSIS — H538 Other visual disturbances: Secondary | ICD-10-CM | POA: Insufficient documentation

## 2013-10-01 DIAGNOSIS — Z8669 Personal history of other diseases of the nervous system and sense organs: Secondary | ICD-10-CM | POA: Insufficient documentation

## 2013-10-01 DIAGNOSIS — Z79899 Other long term (current) drug therapy: Secondary | ICD-10-CM | POA: Insufficient documentation

## 2013-10-01 DIAGNOSIS — G8929 Other chronic pain: Secondary | ICD-10-CM | POA: Insufficient documentation

## 2013-10-01 DIAGNOSIS — K219 Gastro-esophageal reflux disease without esophagitis: Secondary | ICD-10-CM | POA: Insufficient documentation

## 2013-10-01 DIAGNOSIS — H269 Unspecified cataract: Secondary | ICD-10-CM | POA: Insufficient documentation

## 2013-10-01 DIAGNOSIS — M79609 Pain in unspecified limb: Secondary | ICD-10-CM | POA: Insufficient documentation

## 2013-10-01 DIAGNOSIS — M549 Dorsalgia, unspecified: Secondary | ICD-10-CM | POA: Insufficient documentation

## 2013-10-01 LAB — URINALYSIS, ROUTINE W REFLEX MICROSCOPIC
Protein, ur: NEGATIVE mg/dL
Urobilinogen, UA: 0.2 mg/dL (ref 0.0–1.0)

## 2013-10-01 LAB — URINE MICROSCOPIC-ADD ON

## 2013-10-01 MED ORDER — HYDROCODONE-ACETAMINOPHEN 5-325 MG PO TABS: 1.0000 | ORAL_TABLET | Freq: Four times a day (QID) | ORAL | Status: DC | PRN

## 2013-10-01 MED ORDER — HYDROCODONE-ACETAMINOPHEN 5-325 MG PO TABS
1.0000 | ORAL_TABLET | Freq: Once | ORAL | Status: AC
  Administered 2013-10-01: 1 via ORAL
  Filled 2013-10-01: qty 1

## 2013-10-01 NOTE — ED Notes (Signed)
Pt has obvious curvature of the back. Pt states she has scoliosis.

## 2013-10-01 NOTE — ED Notes (Signed)
Pt states that her back pain radiates down her legs and up into her arms on the right side. Pt also states it hurts on left front hip.

## 2013-10-01 NOTE — ED Provider Notes (Signed)
CSN: [id number]     Arrival date & time 10/01/13  1226 History   First MD Initiated Contact with Patient 10/01/13 1242     Chief Complaint  Patient presents with  . Back Pain    HPI  [name] is a 72 y.o. female with a PMH of GERD, headache, chronic back pain, and arthritis who presents to the ED for evaluation of back pain.  History was provided by the patient.  Patient states she has had back pain for 1 year.  Her back pain is located in her back throughout, which radiates down her right leg.  She has been seen by Dr. Ethelene Hal for her back pain and received an injection for her back on 09/25/13, but it did not help her pain.  Her aching pain is worse with movement and improved by laying flat.  She has a hx of scoliosis.  She denies any injuries/trauma or acute changes in her condition in the past year.  She denies any numbness/tingling, weakness, loss of sensation, or loss of bowel/bladder function.  She otherwise has been well with no fevers, chills, change in appetite/activity, chest pain, SOB, abdominal pain, emesis, dysuria, hematuria, leg edema, or headaches.  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   Past Medical History  Diagnosis Date  . GERD (gastroesophageal reflux disease)   . Cataracts, bilateral     hx of  . Headache   . Chronic back pain   . Arthritis     RA, sees Dr.  Ethelene Hal   Past Surgical History  Procedure Laterality Date  . Eye surgery      cataract bilateral  . Hand surgery    . Fracture surgery      under right eye  . Foot surgery    . Finger arthrodesis  06/06/2012    Procedure: ARTHRODESIS FINGER;  Surgeon: Dominica Severin, MD;  Location: [facility] OR;  Service: Orthopedics;  Laterality: Right;  RIGHT SMALL FINGER AND RING FINGER PIP FUSION WITH AUTOLOGOUS GRAFT FROM RIGHT WRIST   No family history on file. History   Substance Use Topics  . Smoking status: Never Smoker   . Smokeless tobacco: Not on file  . Alcohol Use: No   OB History   Grav Para Term Preterm Abortions TAB SAB Ect Mult Living                 Review of Systems  Constitutional: Negative for fever, chills, diaphoresis, activity change, appetite change and fatigue.  HENT: Negative for congestion, rhinorrhea and sore throat.   Eyes: Positive for visual disturbance (at baseline - cataracts).  Respiratory: Negative for cough and shortness of breath.   Cardiovascular: Negative for chest pain, palpitations and leg swelling.  Gastrointestinal: Negative for nausea, vomiting, abdominal pain, diarrhea and constipation.  Genitourinary: Negative for dysuria.  Musculoskeletal: Positive for arthralgias (at baseline), back pain (chronic) and myalgias (at baseline). Negative for gait problem, joint swelling and neck pain.  Skin: Negative for color change and wound.  Neurological: Negative for dizziness, syncope, weakness, light-headedness, numbness and headaches.  Psychiatric/Behavioral: Negative for confusion.    Allergies  Review of patient's allergies indicates no known allergies.  Home Medications   Current Outpatient Rx  Name  Route  Sig  Dispense  Refill  . cycloSPORINE (RESTASIS) 0.05 % ophthalmic emulsion   Both Eyes  Place 1 drop into both eyes 2 (two) times daily.         Marland Kitchen HYDROcodone-acetaminophen (NORCO/VICODIN) 5-325 MG per tablet   Oral   Take 1 tablet by mouth 2 (two) times daily.         . hydroxychloroquine (PLAQUENIL) 200 MG tablet   Oral   Take 200 mg by mouth daily.         . megestrol (MEGACE) 40 MG tablet   Oral   Take 40 mg by mouth 2 (two) times daily.         Marland Kitchen omeprazole (PRILOSEC) 20 MG capsule   Oral   Take 20 mg by mouth daily.          BP 155/100  Pulse 92  Temp(Src) 98.7 F (37.1 C) (Oral)  Resp 18  SpO2 100%  Filed Vitals:   10/01/13 1345 10/01/13 1545 10/01/13 1559 10/01/13  1600  BP: 152/79 149/86 149/86 155/100  Pulse:   93 92  Temp:   98.7 F (37.1 C)   TempSrc:   Oral   Resp:   18   SpO2:   100% 100%     Physical Exam  Nursing note and vitals reviewed. Constitutional: She is oriented to person, place, and time. She appears well-developed and well-nourished. No distress.  HENT:  Head: Normocephalic and atraumatic.  Right Ear: External ear normal.  Left Ear: External ear normal.  Nose: Nose normal.  Mouth/Throat: Oropharynx is clear and moist.  Eyes: Conjunctivae are normal. Pupils are equal, round, and reactive to light. Right eye exhibits no discharge. Left eye exhibits no discharge.  Neck: Normal range of motion. Neck supple.  No cervical spinal tenderness or paraspinal tenderness  Cardiovascular: Normal rate, regular rhythm, normal heart sounds and intact distal pulses.  Exam reveals no gallop and no friction rub.   No murmur heard. Dorsalis pedis pulses present and equal bilaterally  Pulmonary/Chest: Effort normal and breath sounds normal. No respiratory distress. She has no wheezes. She has no rales. She exhibits no tenderness.  Abdominal: Soft. Bowel sounds are normal. She exhibits no distension and no mass. There is no tenderness. There is no rebound and no guarding.  Musculoskeletal: Normal range of motion. She exhibits tenderness. She exhibits no edema.  Tenderness to palpation to the back throughout with no focal tenderness.  Scoliosis.  Patient able to ambulate without difficulty or ataxia.  Positive straight leg raise on the right.  Strength 5/5 in the UE and LE bilaterally.  Arthritic joints to the knees, hands, and feet bilaterally   Neurological: She is alert and oriented to person, place, and time.  Patellar reflexes intact bilaterally  Skin: Skin is warm and dry. She is not diaphoretic. No erythema.     No edema, erythema, ecchymosis, or lacerations throughout    ED Course  Procedures (including critical care time) Labs  Review Labs Reviewed - No data to display Imaging Review No results found.  EKG Interpretation   None      Results for orders placed during the hospital encounter of 10/01/13  URINALYSIS, ROUTINE W REFLEX MICROSCOPIC      Result Value Range   Color, Urine AMBER (*) YELLOW   APPearance CLEAR  CLEAR   Specific Gravity, Urine 1.005  1.005 - 1.030   pH 7.0  5.0 - 8.0   Glucose, UA NEGATIVE  NEGATIVE mg/dL   Hgb urine dipstick SMALL (*) NEGATIVE   Bilirubin Urine NEGATIVE  NEGATIVE   Ketones, ur  NEGATIVE  NEGATIVE mg/dL   Protein, ur NEGATIVE  NEGATIVE mg/dL   Urobilinogen, UA 0.2  0.0 - 1.0 mg/dL   Nitrite NEGATIVE  NEGATIVE   Leukocytes, UA NEGATIVE  NEGATIVE  URINE MICROSCOPIC-ADD ON      Result Value Range   WBC, UA 0-2  <3 WBC/hpf   RBC / HPF 0-2  <3 RBC/hpf     DG Thoracic Spine 2 View (Final result)  Result time: 10/01/13 16:00:19    Final result by Rad Results In Interface (10/01/13 16:00:19)    Narrative:   CLINICAL DATA: Back pain.  EXAM: THORACIC SPINE - 2 VIEW  COMPARISON: Lumbar spine series of today's date.  FINDINGS: There is fairly severe thoracolumbar scoliosis. The thoracic curvature is centered in the lower thoracic spine with the degree of angulation of approximately 40 degrees with the convexity towards the right. There is angulation further in the lumbar region. No compression fracture is demonstrated.  IMPRESSION: There is fairly severe thoracolumbar scoliosis with a lower thoracic curvature amounting to approximately 40 degrees with the convexity toward the right.   Electronically Signed By: David Swaziland On: 10/01/2013 16:00             DG Lumbar Spine Complete (Final result)  Result time: 10/01/13 15:55:04    Final result by Rad Results In Interface (10/01/13 15:55:04)    Narrative:   CLINICAL DATA: Back pain.  EXAM: LUMBAR SPINE - COMPLETE 4+ VIEW  COMPARISON: lumbar spine series dated April 15, 2007  FINDINGS: Again  demonstrated is marked lumbar scoliosis which extends to involve the lower thoracic spine. The degree of angulation is at least 60 degrees. There is degenerative disc changes at multiple levels with large lateral bridging osteophytes. No compression fracture is demonstrated. Fine detail of the facet joints is quite limited.  IMPRESSION: There is severe S-shaped scoliosis of the lower thoracic spine and of the lumbar spine with with the most significant involvement being in the lumbar region.   Electronically Signed By: David Swaziland On: 10/01/2013 15:55    MDM   [name] is a 72 y.o. female with a PMH of GERD, headache, chronic back pain, and arthritis who presents to the ED for evaluation of back pain.  Vicodin ordered for pain. Thoracic and lumbar spinal x-rays ordered.  UA ordered.    Rechecks  4:35 PM = Patient resting comfortably.  Feels much better after Vicodin.  Patient offered OP medications for pain however she declined.  Will follow-up with Dr. Ethelene Hal.   5:15 PM = Bedside US done by myself and Dr Rolland Porter.  No evidence of AAA.    Patient evaluated for back pain with a hx of chronic back pain.  Patient has severe scoliosis which is likely the cause of her pain.  She denies any acute changes or injuries.  X-rays show severe scoliosis of the thoracic and lumbar spine.  She had tenderness to palpation throughout and evidence of arthritic joints.  Her urine was negative for UTI.  She was neurovascularly intact with no focal neurological deficits.  She was ambulate to ambulate without difficulty or ataxia.  Patient and improvements in her pain throughout her ED visit.  She remained in no acute distress.  Patient instructed to follow-up with Dr. Ethelene Hal for further management of her pain.  Patient in agreement with discharge and plan.     Discharge Medication List as of 10/01/2013  5:04 PM      Final impressions: 1. Back pain  Luiz Iron PA-C   This  patient was discussed with Dr. Barry Dienes, PA-C 10/03/13 1433

## 2013-10-01 NOTE — Discharge Instructions (Signed)
Return to the ED if you have any worsening/changing pain, weakness, loss of sensation, loss of bowel/bladder function, fever, chest pain, difficulty breathing, abdominal pain, or other concerns (see below)      Back Pain, Adult Low back pain is very common. About 1 in 5 people have back pain.The cause of low back pain is rarely dangerous. The pain often gets better over time.About half of people with a sudden onset of back pain feel better in just 2 weeks. About 8 in 10 people feel better by 6 weeks.  CAUSES Some common causes of back pain include:  Strain of the muscles or ligaments supporting the spine.  Wear and tear (degeneration) of the spinal discs.  Arthritis.  Direct injury to the back. DIAGNOSIS Most of the time, the direct cause of low back pain is not known.However, back pain can be treated effectively even when the exact cause of the pain is unknown.Answering your caregiver's questions about your overall health and symptoms is one of the most accurate ways to make sure the cause of your pain is not dangerous. If your caregiver needs more information, he or she may order lab work or imaging tests (X-rays or MRIs).However, even if imaging tests show changes in your back, this usually does not require surgery. HOME CARE INSTRUCTIONS For many people, back pain returns.Since low back pain is rarely dangerous, it is often a condition that people can learn to [facility] their own.   Remain active. It is stressful on the back to sit or stand in one place. Do not sit, drive, or stand in one place for more than 30 minutes at a time. Take short walks on level surfaces as soon as pain allows.Try to increase the length of time you walk each day.  Do not stay in bed.Resting more than 1 or 2 days can delay your recovery.  Do not avoid exercise or work.Your body is made to move.It is not dangerous to be active, even though your back may hurt.Your back will likely heal faster if you  return to being active before your pain is gone.  Pay attention to your body when you bend and lift. Many people have less discomfortwhen lifting if they bend their knees, keep the load close to their bodies,and avoid twisting. Often, the most comfortable positions are those that put less stress on your recovering back.  Find a comfortable position to sleep. Use a firm mattress and lie on your side with your knees slightly bent. If you lie on your back, put a pillow under your knees.  Only take over-the-counter or prescription medicines as directed by your caregiver. Over-the-counter medicines to reduce pain and inflammation are often the most helpful.Your caregiver may prescribe muscle relaxant drugs.These medicines help dull your pain so you can more quickly return to your normal activities and healthy exercise.  Put ice on the injured area.  Put ice in a plastic bag.  Place a towel between your skin and the bag.  Leave the ice on for 15-20 minutes, 03-04 times a day for the first 2 to 3 days. After that, ice and heat may be alternated to reduce pain and spasms.  Ask your caregiver about trying back exercises and gentle massage. This may be of some benefit.  Avoid feeling anxious or stressed.Stress increases muscle tension and can worsen back pain.It is important to recognize when you are anxious or stressed and learn ways to manage it.Exercise is a great option. SEEK MEDICAL CARE IF:  You have pain that is not relieved with rest or medicine.  You have pain that does not improve in 1 week.  You have new symptoms.  You are generally not feeling well. SEEK IMMEDIATE MEDICAL CARE IF:   You have pain that radiates from your back into your legs.  You develop new bowel or bladder control problems.  You have unusual weakness or numbness in your arms or legs.  You develop nausea or vomiting.  You develop abdominal pain.  You feel faint. Document Released: 10/18/2005  Document Revised: 04/18/2012 Document Reviewed: 03/08/2011 [facility] Patient Information 2014 [place], [place].

## 2013-10-01 NOTE — ED Notes (Signed)
Per [facility] pt from home, pt c/o lumbar pain radiating to her Right leg x15 days. 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 Pt tested negative for TB 3 weeks ago during her last [employer] visit. Pt denies SOB, cough, or congestion

## 2013-10-01 NOTE — ED Notes (Signed)
Pt waiting on Social Work to give pt cab voucher.

## 2013-10-03 NOTE — ED Provider Notes (Signed)
Medical screening examination/treatment/procedure(s) were performed by non-physician practitioner and as supervising physician I was immediately available for consultation/collaboration.  EKG Interpretation   None         Salim Forero B. Mirtha Jain, MD 10/03/13 2005 

## 2013-10-22 ENCOUNTER — Ambulatory Visit (INDEPENDENT_AMBULATORY_CARE_PROVIDER_SITE_OTHER): Payer: Medicare Other | Admitting: Ophthalmology

## 2013-10-22 DIAGNOSIS — Z09 Encounter for follow-up examination after completed treatment for conditions other than malignant neoplasm: Secondary | ICD-10-CM

## 2013-10-22 DIAGNOSIS — H43819 Vitreous degeneration, unspecified eye: Secondary | ICD-10-CM

## 2013-10-22 DIAGNOSIS — H3530 Unspecified macular degeneration: Secondary | ICD-10-CM

## 2013-11-21 ENCOUNTER — Other Ambulatory Visit: Payer: Self-pay

## 2013-11-21 DIAGNOSIS — Z1231 Encounter for screening mammogram for malignant neoplasm of breast: Secondary | ICD-10-CM

## 2013-12-24 ENCOUNTER — Ambulatory Visit
Admission: RE | Admit: 2013-12-24 | Discharge: 2013-12-24 | Disposition: A | Discharge status: Home / Self Care | Payer: PRIVATE HEALTH INSURANCE | Source: Ambulatory Visit

## 2013-12-24 DIAGNOSIS — Z1231 Encounter for screening mammogram for malignant neoplasm of breast: Secondary | ICD-10-CM

## 2014-03-20 ENCOUNTER — Emergency Department (HOSPITAL_COMMUNITY)
Admission: EM | Admit: 2014-03-20 | Discharge: 2014-03-20 | Disposition: A | Discharge status: Home / Self Care | Payer: PRIVATE HEALTH INSURANCE | Attending: Emergency Medicine | Admitting: Emergency Medicine

## 2014-03-20 ENCOUNTER — Encounter (HOSPITAL_COMMUNITY): Payer: Self-pay | Admitting: Emergency Medicine

## 2014-03-20 DIAGNOSIS — G8929 Other chronic pain: Secondary | ICD-10-CM | POA: Insufficient documentation

## 2014-03-20 DIAGNOSIS — Z8669 Personal history of other diseases of the nervous system and sense organs: Secondary | ICD-10-CM | POA: Insufficient documentation

## 2014-03-20 DIAGNOSIS — IMO0002 Reserved for concepts with insufficient information to code with codable children: Secondary | ICD-10-CM | POA: Insufficient documentation

## 2014-03-20 DIAGNOSIS — M069 Rheumatoid arthritis, unspecified: Secondary | ICD-10-CM

## 2014-03-20 DIAGNOSIS — Z8719 Personal history of other diseases of the digestive system: Secondary | ICD-10-CM | POA: Insufficient documentation

## 2014-03-20 DIAGNOSIS — Z79899 Other long term (current) drug therapy: Secondary | ICD-10-CM | POA: Insufficient documentation

## 2014-03-20 LAB — COMPREHENSIVE METABOLIC PANEL
ALBUMIN: 3.7 g/dL (ref 3.5–5.2)
ALT: 11 U/L (ref 0–35)
AST: 45 U/L — AB (ref 0–37)
Alkaline Phosphatase: 53 U/L (ref 39–117)
BUN: 14 mg/dL (ref 6–23)
CALCIUM: 9.6 mg/dL (ref 8.4–10.5)
CO2: 26 mEq/L (ref 19–32)
CREATININE: 0.65 mg/dL (ref 0.50–1.10)
Chloride: 100 mEq/L (ref 96–112)
GFR calc Af Amer: >90 mL/min (ref >90)
GFR calc non Af Amer: 85 mL/min — ABNORMAL LOW (ref >90)
Glucose, Bld: 73 mg/dL (ref 70–99)
Potassium: 4.3 mEq/L (ref 3.7–5.3)
Sodium: 139 mEq/L (ref 137–147)
Total Bilirubin: 0.5 mg/dL (ref 0.3–1.2)
Total Protein: 7.5 g/dL (ref 6.0–8.3)

## 2014-03-20 LAB — CBC WITH DIFFERENTIAL/PLATELET
BASOS ABS: 0 10*3/uL (ref 0.0–0.1)
Basophils Relative: 0 % (ref 0–1)
EOS PCT: 2 % (ref 0–5)
Eosinophils Absolute: 0.1 10*3/uL (ref 0.0–0.7)
HEMATOCRIT: 31.5 % — AB (ref 36.0–46.0)
Hemoglobin: 10.7 g/dL — ABNORMAL LOW (ref 12.0–15.0)
Lymphocytes Relative: 31 % (ref 12–46)
Lymphs Abs: 1.4 10*3/uL (ref 0.7–4.0)
MCH: 30.2 pg (ref 26.0–34.0)
MCHC: 34 g/dL (ref 30.0–36.0)
MCV: 89 fL (ref 78.0–100.0)
MONO ABS: 0.6 10*3/uL (ref 0.1–1.0)
Monocytes Relative: 13 % — ABNORMAL HIGH (ref 3–12)
Neutro Abs: 2.5 10*3/uL (ref 1.7–7.7)
Neutrophils Relative %: 54 % (ref 43–77)
PLATELETS: 226 10*3/uL (ref 150–400)
RBC: 3.54 MIL/uL — ABNORMAL LOW (ref 3.87–5.11)
RDW: 14.2 % (ref 11.5–15.5)
WBC: 4.6 10*3/uL (ref 4.0–10.5)

## 2014-03-20 LAB — URINALYSIS, ROUTINE W REFLEX MICROSCOPIC
Bilirubin Urine: NEGATIVE
GLUCOSE, UA: NEGATIVE mg/dL
Ketones, ur: NEGATIVE mg/dL
Leukocytes, UA: NEGATIVE
Nitrite: NEGATIVE
Protein, ur: NEGATIVE mg/dL
SPECIFIC GRAVITY, URINE: 1.007 (ref 1.005–1.030)
Urobilinogen, UA: 0.2 mg/dL (ref 0.0–1.0)
pH: 7 (ref 5.0–8.0)

## 2014-03-20 LAB — URINE MICROSCOPIC-ADD ON

## 2014-03-20 MED ORDER — MORPHINE SULFATE 4 MG/ML IJ SOLN
4.0000 mg | Freq: Once | INTRAMUSCULAR | Status: AC
  Administered 2014-03-20: 4 mg via INTRAVENOUS
  Filled 2014-03-20: qty 1

## 2014-03-20 MED ORDER — DEXAMETHASONE SODIUM PHOSPHATE 10 MG/ML IJ SOLN
10.0000 mg | Freq: Once | INTRAMUSCULAR | Status: AC
  Administered 2014-03-20: 10 mg via INTRAVENOUS
  Filled 2014-03-20: qty 1

## 2014-03-20 MED ORDER — OXYCODONE-ACETAMINOPHEN 5-325 MG PO TABS
2.0000 | ORAL_TABLET | Freq: Once | ORAL | Status: AC
  Administered 2014-03-20: 2 via ORAL
  Filled 2014-03-20: qty 2

## 2014-03-20 NOTE — Discharge Instructions (Signed)
Please start taking for methylprednisolone as prescribed. This will help with your inflammation and pain from her rheumatoid arthritis. Given you were followed by Dr. Ethelene Hal with pain management, he will need to refill your Percocet.   Rheumatoid Arthritis Rheumatoid arthritis is a long-term (chronic) inflammatory disease that causes pain, swelling, and stiffness of the joints. It can affect the entire body, including the eyes and lungs. The effects of rheumatoid arthritis vary widely among those with the condition. CAUSES  The cause of rheumatoid arthritis is not known. It tends to run in families and is more common in women. Certain cells of the body's natural defense system (immune system) do not work properly and begin to attack healthy joints. It primarily involves the connective tissue that lines the joints (synovial membrane). This can cause damage to the joint. SYMPTOMS   Pain, stiffness, swelling, and decreased motion of many joints, especially in the hands and feet.  Stiffness that is worse in the morning. It may last 1 2 hours or longer.  Numbness and tingling in the hands.  Fatigue.  Loss of appetite.  Weight loss.  Low-grade fever.  Dry eyes and mouth.  Firm lumps (rheumatoid nodules) that grow beneath the skin in areas such as the elbows and hands. DIAGNOSIS  Diagnosis is based on the symptoms described, an exam, and blood tests. Sometimes, X-rays are helpful. TREATMENT  The goals of treatment are to relieve pain, reduce inflammation, and to slow down or stop joint damage and disability. Methods vary and may include:  Maintaining a balance of rest, exercise, and proper nutrition.  Medicines:  Pain relievers (analgesics).  Corticosteroids and nonsteroidal anti-inflammatory drugs (NSAIDs) to reduce inflammation.  Disease-modifying antirheumatic drugs (DMARDs) to try to slow the course of the disease.  Biologic response modifiers to reduce inflammation and  damage.  Physical therapy and occupational therapy.  Surgery for patients with severe joint damage. Joint replacement or fusing of joints may be needed.  Routine monitoring and ongoing care, such as office visits, blood and urine tests, and X-rays. HOME CARE INSTRUCTIONS   Remain physically active and reduce activity when the disease gets worse.  Eat a well-balanced diet.  Put heat on affected joints when you wake up and before activities. Keep the heat on the affected joint for as long as directed by your caregiver.  Put ice on affected joints following activities or exercising.  Put ice in a plastic bag.  Place a towel between your skin and the bag.  Leave the ice on for 15-20 minutes, 03-04 times a day.  Take all medicines and supplements as directed by your caregiver.  Use splints as directed by your caregiver. Splints help maintain joint position and function.  Do not sleep with pillows under your knees. This may lead to spasms.  Participate in a self-management program to keep current with the latest treatment and coping skills. SEEK IMMEDIATE MEDICAL CARE IF:  You have fainting episodes.  You have periods of extreme weakness.  You rapidly develop a hot, painful joint that is more severe than usual joint aches.  You have chills.  You have a fever. MAKE SURE YOU:  Understand these instructions.  Will watch your condition.  Will get help right away if you are not doing well or get worse. FOR MORE INFORMATION  American College of Rheumatology: www.rheumatology.org Arthritis Foundation: www.arthritis.org Document Released: 10/15/2000 Document Revised: 04/18/2012 Document Reviewed: 11/24/2011 [facility] Patient Information 2014 [place], [place].

## 2014-03-20 NOTE — ED Notes (Addendum)
Per reports right shoulder, left leg, and left sided abdominal pain for 2 days. Pt denies injury.

## 2014-03-20 NOTE — ED Notes (Signed)
Assisted pt to rest room, pt was able top[rovide urine specimen. Pt had to use walker, states ems may have taken her cane, she remembers riding with it to hospital.  No cane was at bed side or received by ems.

## 2014-03-20 NOTE — ED Notes (Signed)
She states she "hurt all over for two days".  She is very tiny, bordering on cachectic in appearance.  Marked kyphosis and scoliosis noted.  She denies fever/n/v/d and is in no distress.  When asked, she states she has had this before.  She points to luq area of abd. As an area which is particularly bothersome.

## 2014-03-20 NOTE — ED Provider Notes (Signed)
TIME SEEN: 3:17 PM  CHIEF COMPLAINT: Joint pain  HPI: Patient is a 73 year old female with a history of remote for arthritis on sulfasalazine who is followed by Dr. Ethelene Hal with the pain clinic who presents emergency department with complaints of right shoulder pain, left hip pain, bilateral ankle and hand pain is consistent with exacerbations of her rheumatoid arthritis. She ran out of her oxycodone 4-5 days ago has been having pain since. She denies any fevers, chills, cough, vomiting or diarrhea. She was given a prescription for Medrol Dosepak which she has not started this she was worried this may affect her eyes as she has a history of cataracts. No history of any injury. No back pain. No numbness or focal weakness.  ROS: See HPI Constitutional: no fever  Eyes: no drainage  ENT: no runny nose   Cardiovascular:  no chest pain  Resp: no SOB  GI: no vomiting GU: no dysuria Integumentary: no rash  Allergy: no hives  Musculoskeletal: no leg swelling  Neurological: no slurred speech ROS otherwise negative  PAST MEDICAL HISTORY/PAST SURGICAL HISTORY:  Past Medical History  Diagnosis Date  . GERD (gastroesophageal reflux disease)   . Cataracts, bilateral     hx of  . Headache(784.0)   . Chronic back pain   . Arthritis     RA, sees Dr.  Ethelene Hal    MEDICATIONS:  Prior to Admission medications   Medication Sig Start Date End Date Taking? Authorizing Provider  cycloSPORINE (RESTASIS) 0.05 % ophthalmic emulsion Place 1 drop into both eyes 2 (two) times daily.   Yes Historical Provider, MD  methylPREDNISolone (MEDROL DOSEPAK) 4 MG tablet Take 4 mg by mouth daily. follow package directions   Yes Historical Provider, MD  oxyCODONE-acetaminophen (PERCOCET) 10-325 MG per tablet Take 1 tablet by mouth 3 (three) times daily as needed for pain.   Yes Historical Provider, MD  sulfaSALAzine (AZULFIDINE) 500 MG tablet Take 500 mg by mouth 2 (two) times daily.    Historical Provider, MD     ALLERGIES:  No Known Allergies  SOCIAL HISTORY:  History  Substance Use Topics  . Smoking status: Never Smoker   . Smokeless tobacco: Not on file  . Alcohol Use: No    FAMILY HISTORY: No family history on file.  EXAM: BP 129/57  Pulse 93  Temp(Src) 98.4 F (36.9 C) (Oral)  Resp 19  SpO2 100% CONSTITUTIONAL: Alert and oriented and responds appropriately to questions. Well-appearing; well-nourished, elderly, thin but hydrated HEAD: Normocephalic EYES: Conjunctivae clear, PERRL ENT: normal nose; no rhinorrhea; moist mucous membranes; pharynx without lesions noted NECK: Supple, no meningismus, no LAD  CARD: RRR; S1 and S2 appreciated; no murmurs, no clicks, no rubs, no gallops RESP: Normal chest excursion without splinting or tachypnea; breath sounds clear and equal bilaterally; no wheezes, no rhonchi, no rales,  ABD/GI: Normal bowel sounds; non-distended; soft, non-tender, no rebound, no guarding BACK:  The back appears normal and is non-tender to palpation, there is no CVA tenderness EXT: Joint changes consistent with rheumatoid arthritis, no joint effusions, no erythema or warmth or induration or fluctuance, Normal ROM in all joints; patient is tender to palpation the right shoulder, bilateral hands, bilateral feet, left hip with no obvious bony deformity,; no edema; normal capillary refill; no cyanosis    SKIN: Normal color for age and race; warm NEURO: Moves all extremities equally PSYCH: The patient's mood and manner are appropriate. Grooming and personal hygiene are appropriate.  MEDICAL DECISION MAKING: Patient here exacerbation of  her pain due to her rheumatoid arthritis. She was given a Medrol Dosepak for her primary care physician which has not yet started. She ran out of her chronic oxycodone 4-5 days ago. She has a pain contract who is followed with Dr. Ethelene Hal. We'll check basic labs and urine to assure there is no other organic cause can be exacerbating her chronic  pain. We'll give her a dose of her oral Percocet. Anticipate patient can be discharged home. Discussed with her that she will need a followup with Dr. Ethelene Hal to have her narcotics refill.  ED PROGRESS: Patient's labs are unremarkable. Urine shows no sign of infection. She still complaining of pain but states is improved. Have discussed with patient again that she should start taking her Medrol Dosepak as she has in her purse. Have also discussed with patient that given she is followed by pain management, I cannot refill her chronic narcotics from the emergency department. She will need to followup with her pain management specialist. She verbalized understanding and states she is comfortable with this plan. Patient's son is now at bedside and I have discussed this with him as well he agrees with this plan.     Layla Maw Chace Bisch, DO 03/20/14 2324

## 2014-03-20 NOTE — ED Notes (Signed)
Son reports on way to get mother. Son reports HAS CANE NOT EMS.

## 2014-03-20 NOTE — ED Notes (Signed)
Pt denies left leg and left sided abdominal pain as reported previously. Pt reports arm pain unchanged.

## 2014-03-20 NOTE — ED Notes (Signed)
Bed: [facility] Expected date:  Expected time:  Means of arrival:  Comments: EMS-pain

## 2014-08-08 ENCOUNTER — Other Ambulatory Visit (HOSPITAL_COMMUNITY): Payer: Self-pay | Admitting: Internal Medicine

## 2014-08-08 ENCOUNTER — Ambulatory Visit (HOSPITAL_COMMUNITY)
Admission: RE | Admit: 2014-08-08 | Discharge: 2014-08-08 | Disposition: A | Discharge status: Home / Self Care | Payer: PRIVATE HEALTH INSURANCE | Source: Ambulatory Visit | Attending: Vascular Surgery | Admitting: Vascular Surgery

## 2014-08-08 ENCOUNTER — Other Ambulatory Visit: Payer: Self-pay | Admitting: Internal Medicine

## 2014-08-08 DIAGNOSIS — M79662 Pain in left lower leg: Secondary | ICD-10-CM

## 2014-08-08 DIAGNOSIS — M7989 Other specified soft tissue disorders: Principal | ICD-10-CM

## 2014-08-08 DIAGNOSIS — M79609 Pain in unspecified limb: Secondary | ICD-10-CM

## 2014-08-08 DIAGNOSIS — R52 Pain, unspecified: Secondary | ICD-10-CM

## 2014-08-08 DIAGNOSIS — R609 Edema, unspecified: Secondary | ICD-10-CM

## 2014-08-08 DIAGNOSIS — R600 Localized edema: Secondary | ICD-10-CM

## 2014-08-12 ENCOUNTER — Other Ambulatory Visit: Payer: PRIVATE HEALTH INSURANCE

## 2014-09-06 ENCOUNTER — Other Ambulatory Visit: Payer: Self-pay | Admitting: Internal Medicine

## 2014-09-06 ENCOUNTER — Ambulatory Visit
Admission: RE | Admit: 2014-09-06 | Discharge: 2014-09-06 | Disposition: A | Discharge status: Home / Self Care | Payer: PRIVATE HEALTH INSURANCE | Source: Ambulatory Visit | Attending: Internal Medicine | Admitting: Internal Medicine

## 2014-09-06 DIAGNOSIS — M0570 Rheumatoid arthritis with rheumatoid factor of unspecified site without organ or systems involvement: Secondary | ICD-10-CM

## 2014-10-04 ENCOUNTER — Other Ambulatory Visit: Payer: Self-pay | Admitting: Rheumatology

## 2014-10-04 ENCOUNTER — Ambulatory Visit
Admission: RE | Admit: 2014-10-04 | Discharge: 2014-10-04 | Disposition: A | Discharge status: Home / Self Care | Payer: PRIVATE HEALTH INSURANCE | Source: Ambulatory Visit | Attending: Rheumatology | Admitting: Rheumatology

## 2014-10-04 DIAGNOSIS — T451X5S Adverse effect of antineoplastic and immunosuppressive drugs, sequela: Secondary | ICD-10-CM

## 2014-10-23 ENCOUNTER — Other Ambulatory Visit (HOSPITAL_COMMUNITY): Payer: Self-pay | Admitting: Rheumatology

## 2014-10-23 DIAGNOSIS — R911 Solitary pulmonary nodule: Secondary | ICD-10-CM

## 2014-10-24 ENCOUNTER — Ambulatory Visit (HOSPITAL_COMMUNITY): Payer: PRIVATE HEALTH INSURANCE

## 2014-10-29 ENCOUNTER — Ambulatory Visit (HOSPITAL_COMMUNITY): Payer: PRIVATE HEALTH INSURANCE

## 2014-11-06 ENCOUNTER — Ambulatory Visit (INDEPENDENT_AMBULATORY_CARE_PROVIDER_SITE_OTHER): Payer: Medicare Other | Admitting: Ophthalmology

## 2014-11-14 ENCOUNTER — Other Ambulatory Visit: Payer: Self-pay | Admitting: Internal Medicine

## 2014-11-14 DIAGNOSIS — R911 Solitary pulmonary nodule: Secondary | ICD-10-CM

## 2014-11-15 ENCOUNTER — Ambulatory Visit (INDEPENDENT_AMBULATORY_CARE_PROVIDER_SITE_OTHER): Payer: Medicare Other | Admitting: Ophthalmology

## 2014-11-15 DIAGNOSIS — H3531 Nonexudative age-related macular degeneration: Secondary | ICD-10-CM

## 2014-11-15 DIAGNOSIS — Z79899 Other long term (current) drug therapy: Secondary | ICD-10-CM

## 2014-11-15 DIAGNOSIS — H43813 Vitreous degeneration, bilateral: Secondary | ICD-10-CM

## 2014-11-15 DIAGNOSIS — H538 Other visual disturbances: Secondary | ICD-10-CM

## 2014-11-20 ENCOUNTER — Ambulatory Visit
Admission: RE | Admit: 2014-11-20 | Discharge: 2014-11-20 | Disposition: A | Discharge status: Home / Self Care | Payer: Medicaid Other | Source: Ambulatory Visit | Attending: Internal Medicine | Admitting: Internal Medicine

## 2014-11-20 DIAGNOSIS — R911 Solitary pulmonary nodule: Secondary | ICD-10-CM

## 2015-03-21 ENCOUNTER — Emergency Department (HOSPITAL_COMMUNITY)
Admission: EM | Admit: 2015-03-21 | Discharge: 2015-03-21 | Disposition: A | Discharge status: Home / Self Care | Payer: Medicare Other | Attending: Emergency Medicine | Admitting: Emergency Medicine

## 2015-03-21 ENCOUNTER — Encounter (HOSPITAL_COMMUNITY): Payer: Self-pay | Admitting: Emergency Medicine

## 2015-03-21 ENCOUNTER — Emergency Department (HOSPITAL_COMMUNITY): Payer: Medicare Other

## 2015-03-21 DIAGNOSIS — G8929 Other chronic pain: Secondary | ICD-10-CM | POA: Insufficient documentation

## 2015-03-21 DIAGNOSIS — M1990 Unspecified osteoarthritis, unspecified site: Secondary | ICD-10-CM | POA: Insufficient documentation

## 2015-03-21 DIAGNOSIS — Z8719 Personal history of other diseases of the digestive system: Secondary | ICD-10-CM | POA: Diagnosis not present

## 2015-03-21 DIAGNOSIS — Z79899 Other long term (current) drug therapy: Secondary | ICD-10-CM | POA: Diagnosis not present

## 2015-03-21 DIAGNOSIS — M5489 Other dorsalgia: Secondary | ICD-10-CM

## 2015-03-21 DIAGNOSIS — M549 Dorsalgia, unspecified: Secondary | ICD-10-CM | POA: Diagnosis present

## 2015-03-21 DIAGNOSIS — M419 Scoliosis, unspecified: Secondary | ICD-10-CM | POA: Diagnosis not present

## 2015-03-21 DIAGNOSIS — H269 Unspecified cataract: Secondary | ICD-10-CM | POA: Diagnosis not present

## 2015-03-21 LAB — COMPREHENSIVE METABOLIC PANEL
ALK PHOS: 65 U/L (ref 38–126)
ALT: 16 U/L (ref 14–54)
AST: 29 U/L (ref 15–41)
Albumin: 4.2 g/dL (ref 3.5–5.0)
Anion gap: 8 (ref 5–15)
BILIRUBIN TOTAL: 0.6 mg/dL (ref 0.3–1.2)
BUN: 21 mg/dL — AB (ref 6–20)
CO2: 26 mmol/L (ref 22–32)
Calcium: 9.1 mg/dL (ref 8.9–10.3)
Chloride: 102 mmol/L (ref 101–111)
Creatinine, Ser: 0.56 mg/dL (ref 0.44–1.00)
GLUCOSE: 92 mg/dL (ref 65–99)
POTASSIUM: 3.9 mmol/L (ref 3.5–5.1)
Sodium: 136 mmol/L (ref 135–145)
Total Protein: 8 g/dL (ref 6.5–8.1)

## 2015-03-21 LAB — URINALYSIS, ROUTINE W REFLEX MICROSCOPIC
Bilirubin Urine: NEGATIVE
Glucose, UA: NEGATIVE mg/dL
KETONES UR: NEGATIVE mg/dL
Leukocytes, UA: NEGATIVE
Nitrite: NEGATIVE
PROTEIN: NEGATIVE mg/dL
Specific Gravity, Urine: 1.01 (ref 1.005–1.030)
UROBILINOGEN UA: 0.2 mg/dL (ref 0.0–1.0)
pH: 6.5 (ref 5.0–8.0)

## 2015-03-21 LAB — CBC
HCT: 34 % — ABNORMAL LOW (ref 36.0–46.0)
Hemoglobin: 11.1 g/dL — ABNORMAL LOW (ref 12.0–15.0)
MCH: 29.9 pg (ref 26.0–34.0)
MCHC: 32.6 g/dL (ref 30.0–36.0)
MCV: 91.6 fL (ref 78.0–100.0)
Platelets: 263 10*3/uL (ref 150–400)
RBC: 3.71 MIL/uL — ABNORMAL LOW (ref 3.87–5.11)
RDW: 13.8 % (ref 11.5–15.5)
WBC: 6 10*3/uL (ref 4.0–10.5)

## 2015-03-21 LAB — URINE MICROSCOPIC-ADD ON

## 2015-03-21 MED ORDER — CYCLOBENZAPRINE HCL 5 MG PO TABS: 5.0000 mg | ORAL_TABLET | Freq: Two times a day (BID) | ORAL | Status: DC | PRN

## 2015-03-21 MED ORDER — MORPHINE SULFATE 4 MG/ML IJ SOLN
4.0000 mg | Freq: Once | INTRAMUSCULAR | Status: AC
  Administered 2015-03-21: 4 mg via INTRAVENOUS
  Filled 2015-03-21: qty 1

## 2015-03-21 NOTE — ED Notes (Signed)
Bed: [facility] Expected date:  Expected time:  Means of arrival:  Comments: EMS- 74yo F, R flank pain x 4 days

## 2015-03-21 NOTE — Discharge Instructions (Signed)
Back Pain, Adult Low back pain is very common. About 1 in 5 people have back pain.The cause of low back pain is rarely dangerous. The pain often gets better over time.About half of people with a sudden onset of back pain feel better in just 2 weeks. About 8 in 10 people feel better by 6 weeks.  CAUSES Some common causes of back pain include:  Strain of the muscles or ligaments supporting the spine.  Wear and tear (degeneration) of the spinal discs.  Arthritis.  Direct injury to the back. DIAGNOSIS Most of the time, the direct cause of low back pain is not known.However, back pain can be treated effectively even when the exact cause of the pain is unknown.Answering your caregiver's questions about your overall health and symptoms is one of the most accurate ways to make sure the cause of your pain is not dangerous. If your caregiver needs more information, he or she may order lab work or imaging tests (X-rays or MRIs).However, even if imaging tests show changes in your back, this usually does not require surgery. HOME CARE INSTRUCTIONS For many people, back pain returns.Since low back pain is rarely dangerous, it is often a condition that people can learn to manageon their own.   Remain active. It is stressful on the back to sit or stand in one place. Do not sit, drive, or stand in one place for more than 30 minutes at a time. Take short walks on level surfaces as soon as pain allows.Try to increase the length of time you walk each day.  Do not stay in bed.Resting more than 1 or 2 days can delay your recovery.  Do not avoid exercise or work.Your body is made to move.It is not dangerous to be active, even though your back may hurt.Your back will likely heal faster if you return to being active before your pain is gone.  Pay attention to your body when you bend and lift. Many people have less discomfortwhen lifting if they bend their knees, keep the load close to their bodies,and  avoid twisting. Often, the most comfortable positions are those that put less stress on your recovering back.  Find a comfortable position to sleep. Use a firm mattress and lie on your side with your knees slightly bent. If you lie on your back, put a pillow under your knees.  Only take over-the-counter or prescription medicines as directed by your caregiver. Over-the-counter medicines to reduce pain and inflammation are often the most helpful.Your caregiver may prescribe muscle relaxant drugs.These medicines help dull your pain so you can more quickly return to your normal activities and healthy exercise.  Put ice on the injured area.  Put ice in a plastic bag.  Place a towel between your skin and the bag.  Leave the ice on for 15-20 minutes, 03-04 times a day for the first 2 to 3 days. After that, ice and heat may be alternated to reduce pain and spasms.  Ask your caregiver about trying back exercises and gentle massage. This may be of some benefit.  Avoid feeling anxious or stressed.Stress increases muscle tension and can worsen back pain.It is important to recognize when you are anxious or stressed and learn ways to manage it.Exercise is a great option. SEEK MEDICAL CARE IF:  You have pain that is not relieved with rest or medicine.  You have pain that does not improve in 1 week.  You have new symptoms.  You are generally not feeling well. SEEK   IMMEDIATE MEDICAL CARE IF:   You have pain that radiates from your back into your legs.  You develop new bowel or bladder control problems.  You have unusual weakness or numbness in your arms or legs.  You develop nausea or vomiting.  You develop abdominal pain.  You feel faint. Document Released: 10/18/2005 Document Revised: 04/18/2012 Document Reviewed: 02/19/2014 [employer] Patient Information 2015 [employer]. This information is not intended to replace advice given to you by your health care provider. Make sure you  discuss any questions you have with your health care provider.  

## 2015-03-21 NOTE — ED Notes (Signed)
Per EMS, Pt from home c/o R flank pain x 4 days. Pt has been seen for it but was not given a diagnoses. Pt given Oxycodone, taking without relief. Pt deferred up into her shoulder and she does have guarding to palpation of R flank. Denies Trauma, fall. No urinary symptoms. No N/V/D. A&Ox4. Ambulatory.

## 2015-03-21 NOTE — ED Notes (Signed)
I attempt to collect labs twice and was unsuccessful 

## 2015-03-21 NOTE — ED Provider Notes (Signed)
CSN: [id number]     Arrival date & time 03/21/15  1413 History   First MD Initiated Contact with Patient 03/21/15 1458     Chief Complaint  Patient presents with  . Flank Pain     (Consider location/radiation/quality/duration/timing/severity/associated sxs/prior Treatment) HPI  74 year old female presents with right-sided back pain for the past 4-5 days. Seen by PCP and given oxycodone without any significant relief. Denies any trauma or fall. The patient states that there is no abdominal pain or urinary symptoms. No nausea, vomiting, or diarrhea. Has a history of chronic back pain this in history but states this is new. Patient denies a weakness or numbness to extremities. Pain does radiate down her right buttock into her right thigh. No fevers or chills. Patient rates her pain as severe.  Past Medical History  Diagnosis Date  . GERD (gastroesophageal reflux disease)   . Cataracts, bilateral     hx of  . Headache(784.0)   . Chronic back pain   . Arthritis     RA, sees Dr.  Ethelene Hal   Past Surgical History  Procedure Laterality Date  . Eye surgery      cataract bilateral  . Hand surgery    . Fracture surgery      under right eye  . Foot surgery    . Finger arthrodesis  06/06/2012    Procedure: ARTHRODESIS FINGER;  Surgeon: Dominica Severin, MD;  Location: [facility] OR;  Service: Orthopedics;  Laterality: Right;  RIGHT SMALL FINGER AND RING FINGER PIP FUSION WITH AUTOLOGOUS GRAFT FROM RIGHT WRIST   No family history on file. History  Substance Use Topics  . Smoking status: Never Smoker   . Smokeless tobacco: Not on file  . Alcohol Use: No   OB History    No data available     Review of Systems  Constitutional: Negative for fever.  Respiratory: Negative for cough and shortness of breath.   Cardiovascular: Negative for chest pain.  Gastrointestinal: Negative for vomiting and abdominal pain.  Genitourinary: Negative for dysuria and hematuria.  Musculoskeletal: Positive for back pain.   Neurological: Negative for weakness and numbness.  All other systems reviewed and are negative.     Allergies  Review of patient's allergies indicates no known allergies.  Home Medications   Prior to Admission medications   Medication Sig Start Date End Date Taking? Authorizing Provider  cycloSPORINE (RESTASIS) 0.05 % ophthalmic emulsion Place 1 drop into both eyes 2 (two) times daily.    Historical Provider, MD  methylPREDNISolone (MEDROL DOSEPAK) 4 MG tablet Take 4 mg by mouth daily. follow package directions    Historical Provider, MD  oxyCODONE-acetaminophen (PERCOCET) 10-325 MG per tablet Take 1 tablet by mouth 3 (three) times daily as needed for pain.    Historical Provider, MD  sulfaSALAzine (AZULFIDINE) 500 MG tablet Take 500 mg by mouth 2 (two) times daily.    Historical Provider, MD   BP 130/71 mmHg  Pulse 84  Resp 20  SpO2 100% Physical Exam  Constitutional: She is oriented to person, place, and time. She appears cachectic.  HENT:  Head: Normocephalic and atraumatic.  Right Ear: External ear normal.  Left Ear: External ear normal.  Nose: Nose normal.  Eyes: Right eye exhibits no discharge. Left eye exhibits no discharge.  Cardiovascular: Normal rate, regular rhythm and normal heart sounds.   Pulmonary/Chest: Effort normal and breath sounds normal.  Abdominal: Soft. She exhibits no distension. There is no tenderness.  Musculoskeletal:  Severe scoliosis. Diffuse  tenderness to light touch over mid thoracic and lumbar spine as well as right back.  Neurological: She is alert and oriented to person, place, and time. She displays no Babinski's sign on the right side. She displays no Babinski's sign on the left side.  Reflex Scores:      Patellar reflexes are 2+ on the right side and 2+ on the left side.      Achilles reflexes are 2+ on the right side and 2+ on the left side. 5/5 strength in bilateral lower extremities  Skin: Skin is warm and dry.  Nursing note and  vitals reviewed.   ED Course  Procedures (including critical care time) Labs Review Labs Reviewed  COMPREHENSIVE METABOLIC PANEL - Abnormal; Notable for the following:    BUN 21 (*)    All other components within normal limits  CBC - Abnormal; Notable for the following:    RBC 3.71 (*)    Hemoglobin 11.1 (*)    HCT 34.0 (*)    All other components within normal limits  URINALYSIS, ROUTINE W REFLEX MICROSCOPIC - Abnormal; Notable for the following:    Hgb urine dipstick SMALL (*)    All other components within normal limits  URINE MICROSCOPIC-ADD ON    Imaging Review Ct Renal Stone Study  03/21/2015   CLINICAL DATA:  Right flank pain for 4 days, no trauma  EXAM: CT ABDOMEN AND PELVIS WITHOUT CONTRAST  TECHNIQUE: Multidetector CT imaging of the abdomen and pelvis was performed following the standard protocol without IV contrast.  COMPARISON:  Lumbar spine 10/01/2013  FINDINGS: Again noted significant dextroscoliosis lower thoracic and upper lumbar spine. Multilevel degenerative changes are noted lumbar spine with anterior and lateral spurring.  The lung bases are unremarkable. There is levoscoliosis of lower lumbar spine.  Unenhanced liver shows no biliary ductal dilatation. Unenhanced pancreas, spleen and adrenal glands are unremarkable. No calcified gallstones are noted within gallbladder.  No aortic aneurysm.  No small bowel obstruction.  Colonic diverticula are noted right colon. No evidence of acute diverticulitis. No pericecal inflammation. The terminal ileum is unremarkable.  Unenhanced kidneys shows no nephrolithiasis. No hydronephrosis or hydroureter. No calcified ureteral calculi are noted. Atrophic uterus. No calcified calculi are noted within under distended urinary bladder. No colonic obstruction.  IMPRESSION: 1. No nephrolithiasis.  No hydronephrosis or hydroureter. 2. Again noted significant S-shaped thoracolumbar scoliosis. 3. No calcified ureteral calculi are noted bilaterally.  4. No calcified calculi are noted within under distended urinary bladder. 5. Degenerative changes lumbar spine. 6. Colonic diverticula are noted right colon. No evidence of acute diverticulitis. 7. No small bowel obstruction.   Electronically Signed   By: Natasha Mead M.D.   On: 03/21/2015 16:43     EKG Interpretation None      MDM   Final diagnoses:  Right-sided back pain, unspecified location    No significant etiology seen on the CT scan. No evidence of UTI/pyelonephritis or ureteral colic. Given patient is very tender on her back this is most likely a musculoskeletal etiology. Her PCP is apparently put her on Percocet and a Medrol Dosepak. Will also put on low-dose Flexeril and recommend close follow-up with PCP. There are no neurologic deficits and she is able to ambulate. Stable for discharge home.    Pricilla Loveless, MD 03/21/15 [phone]

## 2015-03-25 ENCOUNTER — Other Ambulatory Visit (HOSPITAL_COMMUNITY): Payer: Self-pay | Admitting: Rheumatology

## 2015-03-25 ENCOUNTER — Encounter (HOSPITAL_COMMUNITY): Payer: Self-pay

## 2015-03-25 ENCOUNTER — Ambulatory Visit (HOSPITAL_COMMUNITY)
Admission: RE | Admit: 2015-03-25 | Discharge: 2015-03-25 | Disposition: A | Discharge status: Home / Self Care | Payer: Medicare Other | Source: Ambulatory Visit | Attending: Rheumatology | Admitting: Rheumatology

## 2015-03-25 DIAGNOSIS — M419 Scoliosis, unspecified: Secondary | ICD-10-CM | POA: Diagnosis not present

## 2015-03-25 DIAGNOSIS — R911 Solitary pulmonary nodule: Secondary | ICD-10-CM | POA: Diagnosis present

## 2015-03-25 DIAGNOSIS — R918 Other nonspecific abnormal finding of lung field: Secondary | ICD-10-CM | POA: Diagnosis not present

## 2015-03-25 MED ORDER — IOHEXOL 300 MG/ML  SOLN
80.0000 mL | Freq: Once | INTRAMUSCULAR | Status: AC | PRN
  Administered 2015-03-25: 70 mL via INTRAVENOUS

## 2015-09-05 ENCOUNTER — Other Ambulatory Visit: Payer: Self-pay | Admitting: Orthopedic Surgery

## 2015-09-17 NOTE — Pre-Procedure Instructions (Signed)
    LIAM BOSSMAN  09/17/2015      [facility] - [place], [place] - [address] [place] [address] Phone: [phone] Fax: [phone]    Your procedure is scheduled on 09/26/15.  Report to [facility] Admitting at 530 A.M.  Call this number if you have problems the morning of surgery:  [phone]   Remember:  Do not eat food or drink liquids after midnight.  Take these medicines the morning of surgery with A SIP OF WATER --oxycodone   Do not wear jewelry, make-up or nail polish.  Do not wear lotions, powders, or perfumes.  You may wear deodorant.  Do not shave 48 hours prior to surgery.  Men may shave face and neck.  Do not bring valuables to the hospital.  [facility] is not responsible for any belongings or valuables.  Contacts, dentures or bridgework may not be worn into surgery.  Leave your suitcase in the car.  After surgery it may be brought to your room.  For patients admitted to the hospital, discharge time will be determined by your treatment team.  Patients discharged the day of surgery will not be allowed to drive home.   Name and phone number of your driver:   Special instructions:   Please read over the following fact sheets that you were given. Pain Booklet, Coughing and Deep Breathing and Surgical Site Infection Prevention

## 2015-09-18 ENCOUNTER — Encounter (HOSPITAL_COMMUNITY): Payer: Self-pay

## 2015-09-18 ENCOUNTER — Encounter (HOSPITAL_COMMUNITY)
Admission: RE | Admit: 2015-09-18 | Discharge: 2015-09-18 | Disposition: A | Discharge status: Home / Self Care | Payer: Medicare Other | Source: Ambulatory Visit | Attending: Orthopedic Surgery | Admitting: Orthopedic Surgery

## 2015-09-18 DIAGNOSIS — R9431 Abnormal electrocardiogram [ECG] [EKG]: Secondary | ICD-10-CM | POA: Insufficient documentation

## 2015-09-18 DIAGNOSIS — K219 Gastro-esophageal reflux disease without esophagitis: Secondary | ICD-10-CM | POA: Diagnosis not present

## 2015-09-18 DIAGNOSIS — Z01812 Encounter for preprocedural laboratory examination: Secondary | ICD-10-CM | POA: Insufficient documentation

## 2015-09-18 DIAGNOSIS — Z01818 Encounter for other preprocedural examination: Secondary | ICD-10-CM | POA: Diagnosis not present

## 2015-09-18 DIAGNOSIS — Z79899 Other long term (current) drug therapy: Secondary | ICD-10-CM | POA: Diagnosis not present

## 2015-09-18 DIAGNOSIS — M069 Rheumatoid arthritis, unspecified: Secondary | ICD-10-CM | POA: Diagnosis not present

## 2015-09-18 LAB — BASIC METABOLIC PANEL
ANION GAP: 10 (ref 5–15)
BUN: 13 mg/dL (ref 6–20)
CALCIUM: 9.6 mg/dL (ref 8.9–10.3)
CHLORIDE: 103 mmol/L (ref 101–111)
CO2: 25 mmol/L (ref 22–32)
Creatinine, Ser: 0.66 mg/dL (ref 0.44–1.00)
GFR calc non Af Amer: >60 mL/min (ref >60)
GLUCOSE: 77 mg/dL (ref 65–99)
Potassium: 4 mmol/L (ref 3.5–5.1)
Sodium: 138 mmol/L (ref 135–145)

## 2015-09-18 LAB — CBC
HEMATOCRIT: 33.9 % — AB (ref 36.0–46.0)
HEMOGLOBIN: 11.4 g/dL — AB (ref 12.0–15.0)
MCH: 31.2 pg (ref 26.0–34.0)
MCHC: 33.6 g/dL (ref 30.0–36.0)
MCV: 92.9 fL (ref 78.0–100.0)
Platelets: 212 10*3/uL (ref 150–400)
RBC: 3.65 MIL/uL — ABNORMAL LOW (ref 3.87–5.11)
RDW: 15.7 % — AB (ref 11.5–15.5)
WBC: 5.6 10*3/uL (ref 4.0–10.5)

## 2015-09-18 NOTE — Progress Notes (Signed)
Anesthesia Chart Review:  Pt is 74 year old female scheduled for L hand metacarpophalangeal arthroplasty of index, middle, ring, and small fingers with realignment of extensor tendon on 09/26/2015 with Dr. Amanda Pea.   PMH includes:  RA, GERD. Never smoker. BMI 19. S/p R finger arthrodesis  Medications include: methotrexate.   Preoperative labs reviewed.    Chest x-ray 10/04/14 reviewed. No active cardiopulmonary disease.   EKG 09/18/15: Sinus rhythm with short PR. T wave abnormality, consider anterior ischemia  Reviewed case with Dr. Renold Don.   If no changes, I anticipate pt can proceed with surgery as scheduled.   Rica Mast, FNP-BC [facility]/Anesthesiology Phone: [phone] 09/18/2015 3:28 PM

## 2015-09-24 MED ORDER — CHLORHEXIDINE GLUCONATE 4 % EX LIQD: 60.0000 mL | Freq: Once | CUTANEOUS | Status: DC

## 2015-09-24 MED ORDER — CEFAZOLIN SODIUM-DEXTROSE 2-3 GM-% IV SOLR
2.0000 g | INTRAVENOUS | Status: AC
  Administered 2015-09-26: 2 g via INTRAVENOUS
  Filled 2015-09-24: qty 50

## 2015-09-26 ENCOUNTER — Inpatient Hospital Stay (HOSPITAL_COMMUNITY): Payer: Medicare Other | Admitting: Certified Registered Nurse Anesthetist

## 2015-09-26 ENCOUNTER — Encounter (HOSPITAL_COMMUNITY): Payer: Self-pay | Admitting: Certified Registered Nurse Anesthetist

## 2015-09-26 ENCOUNTER — Inpatient Hospital Stay (HOSPITAL_COMMUNITY)
Admission: RE | Admit: 2015-09-26 | Discharge: 2015-09-30 | DRG: 502 | Disposition: A | Discharge status: Home Health | Payer: Medicare Other | Source: Ambulatory Visit | Attending: Orthopedic Surgery | Admitting: Orthopedic Surgery

## 2015-09-26 ENCOUNTER — Inpatient Hospital Stay (HOSPITAL_COMMUNITY): Payer: Medicare Other | Admitting: Emergency Medicine

## 2015-09-26 ENCOUNTER — Encounter (HOSPITAL_COMMUNITY)
Admission: RE | Disposition: A | Discharge status: Home Health | Payer: Self-pay | Source: Ambulatory Visit | Attending: Orthopedic Surgery

## 2015-09-26 DIAGNOSIS — Z981 Arthrodesis status: Secondary | ICD-10-CM

## 2015-09-26 DIAGNOSIS — M069 Rheumatoid arthritis, unspecified: Secondary | ICD-10-CM | POA: Diagnosis present

## 2015-09-26 DIAGNOSIS — Z79899 Other long term (current) drug therapy: Secondary | ICD-10-CM

## 2015-09-26 DIAGNOSIS — K219 Gastro-esophageal reflux disease without esophagitis: Secondary | ICD-10-CM | POA: Diagnosis present

## 2015-09-26 DIAGNOSIS — M79642 Pain in left hand: Secondary | ICD-10-CM | POA: Diagnosis present

## 2015-09-26 DIAGNOSIS — M0579 Rheumatoid arthritis with rheumatoid factor of multiple sites without organ or systems involvement: Secondary | ICD-10-CM | POA: Diagnosis present

## 2015-09-26 DIAGNOSIS — Z791 Long term (current) use of non-steroidal anti-inflammatories (NSAID): Secondary | ICD-10-CM | POA: Diagnosis not present

## 2015-09-26 HISTORY — PX: FINGER ARTHROPLASTY: SHX5017

## 2015-09-26 HISTORY — PX: REPAIR EXTENSOR TENDON: SHX5382

## 2015-09-26 SURGERY — ARTHROPLASTY, FINGER
Anesthesia: Monitor Anesthesia Care | Site: Hand | Laterality: Left

## 2015-09-26 MED ORDER — MORPHINE SULFATE (PF) 2 MG/ML IV SOLN
1.0000 mg | INTRAVENOUS | Status: DC | PRN
  Administered 2015-09-27 (×3): 1 mg via INTRAVENOUS
  Filled 2015-09-26 (×3): qty 1

## 2015-09-26 MED ORDER — ONDANSETRON HCL 4 MG/2ML IJ SOLN
INTRAMUSCULAR | Status: AC
  Filled 2015-09-26: qty 2

## 2015-09-26 MED ORDER — ONDANSETRON HCL 4 MG/2ML IJ SOLN
4.0000 mg | Freq: Four times a day (QID) | INTRAMUSCULAR | Status: DC | PRN
  Administered 2015-09-27: 4 mg via INTRAVENOUS
  Filled 2015-09-26: qty 2

## 2015-09-26 MED ORDER — ONDANSETRON HCL 4 MG/2ML IJ SOLN: 4.0000 mg | Freq: Once | INTRAMUSCULAR | Status: DC | PRN

## 2015-09-26 MED ORDER — LACTATED RINGERS IV SOLN
INTRAVENOUS | Status: DC | PRN
  Administered 2015-09-26 (×2): via INTRAVENOUS

## 2015-09-26 MED ORDER — EPHEDRINE SULFATE 50 MG/ML IJ SOLN
INTRAMUSCULAR | Status: AC
  Filled 2015-09-26: qty 1

## 2015-09-26 MED ORDER — 0.9 % SODIUM CHLORIDE (POUR BTL) OPTIME
TOPICAL | Status: DC | PRN
  Administered 2015-09-26: 2000 mL

## 2015-09-26 MED ORDER — PROPOFOL 500 MG/50ML IV EMUL
INTRAVENOUS | Status: DC | PRN
  Administered 2015-09-26: 50 ug/kg/min via INTRAVENOUS

## 2015-09-26 MED ORDER — FAMOTIDINE 20 MG PO TABS: 20.0000 mg | ORAL_TABLET | Freq: Two times a day (BID) | ORAL | Status: DC | PRN

## 2015-09-26 MED ORDER — FENTANYL CITRATE (PF) 100 MCG/2ML IJ SOLN
INTRAMUSCULAR | Status: AC
  Administered 2015-09-26: 25 ug via INTRAVENOUS
  Filled 2015-09-26: qty 2

## 2015-09-26 MED ORDER — MIDAZOLAM HCL 2 MG/2ML IJ SOLN
INTRAMUSCULAR | Status: AC
  Filled 2015-09-26: qty 2

## 2015-09-26 MED ORDER — OXYCODONE-ACETAMINOPHEN 10-325 MG PO TABS: 1.0000 | ORAL_TABLET | ORAL | Status: DC | PRN

## 2015-09-26 MED ORDER — FENTANYL CITRATE (PF) 100 MCG/2ML IJ SOLN
INTRAMUSCULAR | Status: DC | PRN
  Administered 2015-09-26 (×3): 25 ug via INTRAVENOUS

## 2015-09-26 MED ORDER — OXYCODONE-ACETAMINOPHEN 5-325 MG PO TABS
1.0000 | ORAL_TABLET | ORAL | Status: DC | PRN
  Administered 2015-09-26 – 2015-09-30 (×10): 1 via ORAL
  Filled 2015-09-26 (×10): qty 1

## 2015-09-26 MED ORDER — GLYCOPYRROLATE 0.2 MG/ML IJ SOLN
INTRAMUSCULAR | Status: AC
  Filled 2015-09-26: qty 1

## 2015-09-26 MED ORDER — PROMETHAZINE HCL 25 MG RE SUPP: 12.5000 mg | Freq: Four times a day (QID) | RECTAL | Status: DC | PRN

## 2015-09-26 MED ORDER — METHOCARBAMOL 1000 MG/10ML IJ SOLN
500.0000 mg | Freq: Four times a day (QID) | INTRAVENOUS | Status: DC | PRN
  Filled 2015-09-26: qty 5

## 2015-09-26 MED ORDER — LACTATED RINGERS IV SOLN
INTRAVENOUS | Status: DC
  Administered 2015-09-26: 75 mL/h via INTRAVENOUS

## 2015-09-26 MED ORDER — BUPIVACAINE HCL (PF) 0.25 % IJ SOLN: INTRAMUSCULAR | Status: DC | PRN

## 2015-09-26 MED ORDER — CROMOLYN SODIUM 4 % OP SOLN
1.0000 [drp] | Freq: Four times a day (QID) | OPHTHALMIC | Status: DC
  Administered 2015-09-26 – 2015-09-30 (×15): 1 [drp] via OPHTHALMIC
  Filled 2015-09-26: qty 10

## 2015-09-26 MED ORDER — CEFAZOLIN SODIUM 1-5 GM-% IV SOLN
1.0000 g | INTRAVENOUS | Status: AC
  Administered 2015-09-26: 1 g via INTRAVENOUS
  Filled 2015-09-26: qty 50

## 2015-09-26 MED ORDER — METHOCARBAMOL 500 MG PO TABS
500.0000 mg | ORAL_TABLET | Freq: Four times a day (QID) | ORAL | Status: DC | PRN
  Administered 2015-09-27 – 2015-09-28 (×4): 500 mg via ORAL
  Filled 2015-09-26 (×4): qty 1

## 2015-09-26 MED ORDER — OXYCODONE HCL 5 MG PO TABS
5.0000 mg | ORAL_TABLET | ORAL | Status: DC | PRN
  Administered 2015-09-27 – 2015-09-30 (×4): 5 mg via ORAL
  Filled 2015-09-26 (×4): qty 1

## 2015-09-26 MED ORDER — CEFAZOLIN SODIUM 1-5 GM-% IV SOLN
1.0000 g | Freq: Three times a day (TID) | INTRAVENOUS | Status: DC
  Administered 2015-09-26 – 2015-09-30 (×11): 1 g via INTRAVENOUS
  Filled 2015-09-26 (×13): qty 50

## 2015-09-26 MED ORDER — FENTANYL CITRATE (PF) 100 MCG/2ML IJ SOLN
25.0000 ug | INTRAMUSCULAR | Status: DC | PRN
  Administered 2015-09-26: 25 ug via INTRAVENOUS

## 2015-09-26 MED ORDER — LIDOCAINE HCL (CARDIAC) 20 MG/ML IV SOLN
INTRAVENOUS | Status: AC
  Filled 2015-09-26: qty 5

## 2015-09-26 MED ORDER — FOLIC ACID 1 MG PO TABS
1.0000 mg | ORAL_TABLET | Freq: Every day | ORAL | Status: DC
Start: 2015-09-27 — End: 2015-09-30
  Administered 2015-09-27 – 2015-09-30 (×4): 1 mg via ORAL
  Filled 2015-09-26 (×4): qty 1

## 2015-09-26 MED ORDER — ALPRAZOLAM 0.5 MG PO TABS
0.5000 mg | ORAL_TABLET | Freq: Four times a day (QID) | ORAL | Status: DC | PRN
Start: 2015-09-26 — End: 2015-09-30
  Administered 2015-09-27: 0.5 mg via ORAL
  Filled 2015-09-26: qty 1

## 2015-09-26 MED ORDER — VITAMIN C 500 MG PO TABS
1000.0000 mg | ORAL_TABLET | Freq: Every day | ORAL | Status: DC
  Administered 2015-09-27 – 2015-09-30 (×4): 1000 mg via ORAL
  Filled 2015-09-26 (×4): qty 2

## 2015-09-26 MED ORDER — SODIUM CHLORIDE 0.9 % IJ SOLN
INTRAMUSCULAR | Status: AC
  Filled 2015-09-26: qty 10

## 2015-09-26 MED ORDER — ROCURONIUM BROMIDE 50 MG/5ML IV SOLN
INTRAVENOUS | Status: AC
  Filled 2015-09-26: qty 1

## 2015-09-26 MED ORDER — FENTANYL CITRATE (PF) 250 MCG/5ML IJ SOLN
INTRAMUSCULAR | Status: AC
  Filled 2015-09-26: qty 5

## 2015-09-26 MED ORDER — PROPOFOL 10 MG/ML IV BOLUS
INTRAVENOUS | Status: AC
  Filled 2015-09-26: qty 40

## 2015-09-26 MED ORDER — ONDANSETRON HCL 4 MG PO TABS: 4.0000 mg | ORAL_TABLET | Freq: Four times a day (QID) | ORAL | Status: DC | PRN

## 2015-09-26 MED ORDER — MIDAZOLAM HCL 5 MG/5ML IJ SOLN
INTRAMUSCULAR | Status: DC | PRN
  Administered 2015-09-26: 0.5 mg via INTRAVENOUS
  Administered 2015-09-26: 1 mg via INTRAVENOUS
  Administered 2015-09-26: 0.5 mg via INTRAVENOUS

## 2015-09-26 MED ORDER — CYCLOSPORINE 0.05 % OP EMUL
1.0000 [drp] | Freq: Two times a day (BID) | OPHTHALMIC | Status: DC
  Administered 2015-09-26 – 2015-09-30 (×9): 1 [drp] via OPHTHALMIC
  Filled 2015-09-26 (×10): qty 1

## 2015-09-26 MED ORDER — BUPIVACAINE HCL (PF) 0.25 % IJ SOLN
INTRAMUSCULAR | Status: AC
  Filled 2015-09-26: qty 30

## 2015-09-26 MED ORDER — ONDANSETRON HCL 4 MG/2ML IJ SOLN
INTRAMUSCULAR | Status: DC | PRN
  Administered 2015-09-26: 4 mg via INTRAVENOUS

## 2015-09-26 MED ORDER — DOCUSATE SODIUM 100 MG PO CAPS
100.0000 mg | ORAL_CAPSULE | Freq: Two times a day (BID) | ORAL | Status: DC
  Administered 2015-09-26 – 2015-09-30 (×9): 100 mg via ORAL
  Filled 2015-09-26 (×9): qty 1

## 2015-09-26 MED ORDER — BUPIVACAINE HCL (PF) 0.25 % IJ SOLN
INTRAMUSCULAR | Status: AC
Start: 2015-09-26 — End: 2015-09-26
  Filled 2015-09-26: qty 30

## 2015-09-26 MED ORDER — SUCCINYLCHOLINE CHLORIDE 20 MG/ML IJ SOLN
INTRAMUSCULAR | Status: AC
  Filled 2015-09-26: qty 1

## 2015-09-26 SURGICAL SUPPLY — 57 items
BANDAGE ELASTIC 3 VELCRO ST LF (GAUZE/BANDAGES/DRESSINGS) ×2 IMPLANT
BANDAGE ELASTIC 4 VELCRO ST LF (GAUZE/BANDAGES/DRESSINGS) ×2 IMPLANT
BNDG COHESIVE 1X5 TAN STRL LF (GAUZE/BANDAGES/DRESSINGS) IMPLANT
BNDG GAUZE ELAST 4 BULKY (GAUZE/BANDAGES/DRESSINGS) ×3 IMPLANT
BUR SABER DIAMOND 3.0 (BURR) ×1 IMPLANT
CORDS BIPOLAR (ELECTRODE) ×2 IMPLANT
COVER SURGICAL LIGHT HANDLE (MISCELLANEOUS) ×2 IMPLANT
CUFF TOURNIQUET SINGLE 18IN (TOURNIQUET CUFF) ×2 IMPLANT
CUFF TOURNIQUET SINGLE 24IN (TOURNIQUET CUFF) IMPLANT
DRAPE OEC MINIVIEW 54X84 (DRAPES) IMPLANT
DRAPE SURG 17X11 SM STRL (DRAPES) ×2 IMPLANT
DRAPE SURG 17X23 STRL (DRAPES) ×2 IMPLANT
DRSG ADAPTIC 3X8 NADH LF (GAUZE/BANDAGES/DRESSINGS) ×2 IMPLANT
GAUZE SPONGE 2X2 8PLY STRL LF (GAUZE/BANDAGES/DRESSINGS) IMPLANT
GAUZE SPONGE 4X4 12PLY STRL (GAUZE/BANDAGES/DRESSINGS) IMPLANT
GAUZE SPONGE 4X4 16PLY XRAY LF (GAUZE/BANDAGES/DRESSINGS) ×2 IMPLANT
GAUZE XEROFORM 1X8 LF (GAUZE/BANDAGES/DRESSINGS) IMPLANT
GAUZE XEROFORM 5X9 LF (GAUZE/BANDAGES/DRESSINGS) ×1 IMPLANT
GLOVE BIOGEL M STRL SZ7.5 (GLOVE) ×2 IMPLANT
GLOVE BIOGEL PI IND STRL 7.5 (GLOVE) ×1 IMPLANT
GLOVE BIOGEL PI INDICATOR 7.5 (GLOVE) ×1
GLOVE SS BIOGEL STRL SZ 8 (GLOVE) ×1 IMPLANT
GLOVE SUPERSENSE BIOGEL SZ 8 (GLOVE) ×1
GLOVE SURG SS PI 7.0 STRL IVOR (GLOVE) ×2 IMPLANT
GOWN STRL REUS W/ TWL LRG LVL3 (GOWN DISPOSABLE) ×2 IMPLANT
GOWN STRL REUS W/ TWL XL LVL3 (GOWN DISPOSABLE) ×3 IMPLANT
GOWN STRL REUS W/TWL LRG LVL3 (GOWN DISPOSABLE) ×4
GOWN STRL REUS W/TWL XL LVL3 (GOWN DISPOSABLE) ×6
IMPLANT FINGER (Trauma) ×2 IMPLANT
IMPLANT FINGER 30MCP (Trauma) ×2 IMPLANT
IMPLANT FINGER JONT SZ20 30DEG (Orthopedic Implant) ×2 IMPLANT
KIT BASIN OR (CUSTOM PROCEDURE TRAY) ×2 IMPLANT
KIT ROOM TURNOVER OR (KITS) ×2 IMPLANT
LOOP VESSEL MAXI BLUE (MISCELLANEOUS) ×4 IMPLANT
MANIFOLD NEPTUNE II (INSTRUMENTS) ×2 IMPLANT
MEDIUM NARROW BLADE 18.5MM X 7.0MM ×2 IMPLANT
NEEDLE HYPO 25GX1X1/2 BEV (NEEDLE) IMPLANT
NS IRRIG 1000ML POUR BTL (IV SOLUTION) ×2 IMPLANT
PACK ORTHO EXTREMITY (CUSTOM PROCEDURE TRAY) ×2 IMPLANT
PAD ARMBOARD 7.5X6 YLW CONV (MISCELLANEOUS) ×4 IMPLANT
PAD CAST 4YDX4 CTTN HI CHSV (CAST SUPPLIES) IMPLANT
PADDING CAST COTTON 4X4 STRL (CAST SUPPLIES)
SOLUTION BETADINE 4OZ (MISCELLANEOUS) ×2 IMPLANT
SPLINT FIBERGLASS 3X12 (CAST SUPPLIES) ×2 IMPLANT
SPONGE GAUZE 2X2 STER 10/PKG (GAUZE/BANDAGES/DRESSINGS)
SPONGE GAUZE 4X4 12PLY STER LF (GAUZE/BANDAGES/DRESSINGS) ×2 IMPLANT
SPONGE SCRUB IODOPHOR (GAUZE/BANDAGES/DRESSINGS) ×2 IMPLANT
SUCTION FRAZIER TIP 10 FR DISP (SUCTIONS) IMPLANT
SUT MERSILENE 4 0 P 3 (SUTURE) IMPLANT
SUT PROLENE 4 0 PS 2 18 (SUTURE) IMPLANT
SUT PROLENE 5 0 P 3 (SUTURE) ×1 IMPLANT
SYR CONTROL 10ML LL (SYRINGE) IMPLANT
TOWEL OR 17X24 6PK STRL BLUE (TOWEL DISPOSABLE) ×2 IMPLANT
TOWEL OR 17X26 10 PK STRL BLUE (TOWEL DISPOSABLE) ×2 IMPLANT
TUBE CONNECTING 12X1/4 (SUCTIONS) IMPLANT
UNDERPAD 30X30 INCONTINENT (UNDERPADS AND DIAPERS) ×2 IMPLANT
WATER STERILE IRR 1000ML POUR (IV SOLUTION) ×2 IMPLANT

## 2015-09-26 NOTE — Anesthesia Postprocedure Evaluation (Signed)
Anesthesia Post Note  Patient: [name]  Procedure(s) Performed: Procedure(s) (LRB): LEFT HAND METACARPOPHALANGEAL ARTHROPLASTY INDEX MIDDLE RING AND SMALL FINGERS WITH REALIGNMENT OF  (Left) EXTENSOR TENDON (Left)  Anesthesia Post Evaluation   Post-op pain well controlled with block  Last Vitals:  Filed Vitals:   09/26/15 1140 09/26/15 1155  BP: 120/75 115/76  Pulse: 68   Temp:    Resp: 17     Last Pain:  Filed Vitals:   09/26/15 1202  PainSc: 4                  Kayci Belleville COKER

## 2015-09-26 NOTE — Anesthesia Preprocedure Evaluation (Signed)
Anesthesia Evaluation  Patient identified by MRN, date of birth, ID band Patient awake    Reviewed: Allergy & Precautions, NPO status , Patient's Chart, lab work & pertinent test results  Airway Mallampati: II       Dental  (+) Edentulous Upper, Dental Advisory Given   Pulmonary    breath sounds clear to auscultation       Cardiovascular  Rhythm:Regular Rate:Normal     Neuro/Psych    GI/Hepatic   Endo/Other    Renal/GU      Musculoskeletal   Abdominal   Peds  Hematology   Anesthesia Other Findings   Reproductive/Obstetrics                             Anesthesia Physical Anesthesia Plan  ASA: III  Anesthesia Plan: General   Post-op Pain Management:    Induction: Intravenous  Airway Management Planned:   Additional Equipment:   Intra-op Plan:   Post-operative Plan:   Informed Consent: I have reviewed the patients History and Physical, chart, labs and discussed the procedure including the risks, benefits and alternatives for the proposed anesthesia with the patient or authorized representative who has indicated his/her understanding and acceptance.     Plan Discussed with: CRNA and Anesthesiologist  Anesthesia Plan Comments:         Anesthesia Quick Evaluation

## 2015-09-26 NOTE — Op Note (Signed)
See [id number] Amanda Pea MD

## 2015-09-26 NOTE — Anesthesia Procedure Notes (Addendum)
Anesthesia Regional Block:  Supraclavicular block  Pre-Anesthetic Checklist: ,, timeout performed, Correct Patient, Correct Site, Correct Laterality, Correct Procedure, Correct Position, site marked, Risks and benefits discussed,  Surgical consent,  Pre-op evaluation,  At surgeon's request and post-op pain management  Laterality: Left  Prep: chloraprep       Needles:  Injection technique: Single-shot  Needle Type: Echogenic Stimulator Needle     Needle Length: 9cm 9 cm Needle Gauge: 21 and 21 G    Additional Needles:  Procedures: ultrasound guided (picture in chart) Supraclavicular block Narrative:  Start time: 09/26/2015 7:20 AM End time: 09/26/2015 7:30 AM Injection made incrementally with aspirations every 5 mL.  Performed by: Personally   Additional Notes: 30 cc 0.5% Bupivacaine with 1:200 Epi injected easily   Procedure Name: MAC Date/Time: 09/26/2015 7:49 AM Performed by: Fabian November Pre-anesthesia Checklist: Patient identified, Emergency Drugs available, Suction available and Patient being monitored Patient Re-evaluated:Patient Re-evaluated prior to inductionOxygen Delivery Method: Nasal cannula Number of attempts: 1

## 2015-09-26 NOTE — Care Management (Signed)
Utilization review completed. Danniell Rotundo, RN Case Manager [phone]. 

## 2015-09-26 NOTE — H&P (Signed)
[name] is an 74 y.o. female.   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 She is a long time patient of ours and has had prior multiple surgical endeavors about her hands. Given the chronic pain and deformity secondary to rheumatoid arthritis the patient desires to proceed ahead with left hand index, middle, ring, small finger MCP arthroplasties with extensor realignment. We have discussed the surgical endeavors with her at length in the office setting. We have discussed with her risk and benefits at length. Preoperative labs are reviewed. The patient desires to proceed.  Past Medical History  Diagnosis Date  . GERD (gastroesophageal reflux disease)   . Cataracts, bilateral     hx of  . Headache(784.0)   . Chronic back pain   . Arthritis     RA, sees Dr.  Ethelene Hal    Past Surgical History  Procedure Laterality Date  . Eye surgery      cataract bilateral  . Hand surgery    . Fracture surgery      under right eye  . Foot surgery    . Finger arthrodesis  06/06/2012    Procedure: ARTHRODESIS FINGER;  Surgeon: Dominica Severin, MD;  Location: [facility] OR;  Service: Orthopedics;  Laterality: Right;  RIGHT SMALL FINGER AND RING FINGER PIP FUSION WITH AUTOLOGOUS GRAFT FROM RIGHT WRIST    History reviewed. No pertinent family history. Social History:  reports that she has never smoked. She has never used smokeless tobacco. She reports that she does not drink alcohol or use illicit drugs.  Allergies: No Known Allergies  Medications Prior to Admission  Medication Sig Dispense Refill  . cromolyn (OPTICROM) 4 % ophthalmic solution Place 1 drop into both eyes 4 (four) times daily.    . cycloSPORINE (RESTASIS) 0.05 % ophthalmic emulsion Place 1 drop into both eyes 2 (two) times daily.    . folic acid (FOLVITE) 1 MG tablet Take 1 mg by mouth every morning.    . methotrexate 2.5 MG tablet  Take 10 mg by mouth once a week. Saturday    . oxyCODONE-acetaminophen (PERCOCET) 10-325 MG tablet Take 1 tablet by mouth every 4 (four) hours as needed for pain.    Marland Kitchen sulfaSALAzine (AZULFIDINE) 500 MG tablet Take 1,000 mg by mouth 2 (two) times daily.       No results found for this or any previous visit (from the past 48 hour(s)). No results found.  Review of Systems  Constitutional: Negative.   HENT: Negative.   Eyes: Negative.   Respiratory: Negative.   Cardiovascular: Negative.   Gastrointestinal: Negative.   Genitourinary: Negative.   Musculoskeletal:       See HPI  Skin: Negative.   Neurological: Negative.   Endo/Heme/Allergies: Negative.   Psychiatric/Behavioral: Negative.     Blood pressure 131/79, pulse 83, temperature 98.5 F (36.9 C), temperature source Oral, resp. rate 18, height 4\' 7"  (1.397 m), weight 37.677 kg (83 lb 1 oz), SpO2 100 %. Physical Exam  The patient is pleasant, fell in appearance, hard of hearing, she speaks minimal . Head: Atraumatic normocephalic Chest: Equal expansions are present respirations are nonlabored Abdomen: Nontender Left upper extremity: Patient is noted to have this deformity cross the MCP joints secondary to rheumatoid arthritis ulnar deviation present, sensation refill are intact, no signs of infection are present.  Assessment/Plan Standing rheumatoid arthritis with significant involvement of the bilateral hands We  are planning surgery for your upper extremity. The risk and benefits of surgery to include risk of bleeding, infection, anesthesia,  damage to normal structures and failure of the surgery to accomplish its intended goals of relieving symptoms and restoring function have been discussed in detail. With this in mind we plan to proceed. I have specifically discussed with the patient the pre-and postoperative regime and the dos and don'ts and risk and benefits in great detail. Risk and benefits of surgery also include risk  of dystrophy(CRPS), chronic nerve pain, failure of the healing process to go onto completion and other inherent risks of surgery The relavent the pathophysiology of the disease/injury process, as well as the alternatives for treatment and postoperative course of action has been discussed in great detail with the patient who desires to proceed.  We will do everything in our power to help you (the patient) restore function to the upper extremity. It is a pleasure to see this patient today.  Bayani Renteria L 09/26/2015, 7:41 AM

## 2015-09-26 NOTE — Transfer of Care (Signed)
Immediate Anesthesia Transfer of Care Note  Patient: [name]  Procedure(s) Performed: Procedure(s): LEFT HAND METACARPOPHALANGEAL ARTHROPLASTY INDEX MIDDLE RING AND SMALL FINGERS WITH REALIGNMENT OF  (Left) EXTENSOR TENDON (Left)  Patient Location: PACU  Anesthesia Type:MAC combined with regional for post-op pain  Level of Consciousness: awake, alert  and oriented  Airway & Oxygen Therapy: Patient Spontanous Breathing and Patient connected to nasal cannula oxygen  Post-op Assessment: Report given to RN and Post -op Vital signs reviewed and stable  Post vital signs: Reviewed and stable  Last Vitals:  Filed Vitals:   09/26/15 0632  BP: 131/79  Pulse: 83  Temp: 36.9 C  Resp: 18    Complications: No apparent anesthesia complications

## 2015-09-26 NOTE — Op Note (Signed)
[name], FINLAYSON NO.:  [id number]  MEDICAL RECORD NO.:  [id number]  LOCATION:  [facility]                         FACILITY:  [facility]  PHYSICIAN:  Dionne Ano. Doyal Saric, M.D.DATE OF BIRTH:  09/20/1941  DATE OF PROCEDURE: DATE OF DISCHARGE:                              OPERATIVE REPORT   PREOPERATIVE DIAGNOSIS:  Severe rheumatoid arthritis deformity left hand with history of multiple procedures. The  patient presents for reconstruction of her index, middle, ring, and small fingers with MCP arthroplasty, joint realignment and extensor realignment, and collateral ligament reconstructions plan.  POSTOPERATIVE DIAGNOSIS:  Severe rheumatoid arthritis deformity left hand with history of multiple procedures. The  patient presents for reconstruction of her index, middle, ring, and small fingers with MCP arthroplasty, joint realignment and extensor realignment, and collateral ligament reconstructions plan.  PROCEDURE: 1. Arthroplasty of left index finger MCP joint size 20 NeuFlex     implant. 2. Extensor realignment left index finger. 3. Radial collateral ligament reconstruction left index finger. 4. Left middle finger size 30 MCP arthroplasty with NeuFlex implant     about the MCP region. 5. Extensor realignment extensive in nature, left middle finger     including ulnar sagittal band release and radial sagittal band     imbrication and repair. 6. Radial collateral ligament repair and reconstruction, left middle     finger. 7. Left ring finger size 10 NeuFlex arthroplasty about the MCP joint. 8. Extensor realignment with ulnar sagittal band release and radial     sagittal band repair imbrication, left ring finger. 9. Radial collateral ligament repair, left ring finger. 10.Size 10 NeuFlex  MCP arthroplasty, left small finger. 11.Extensor realignment, left small finger including ulna sagittal     band release and radial sagittal band imbrication. 12.Radial collateral  ligament repair reconstruction, left small     finger. 13.AP, lateral, and oblique x-rays performed, examined, and     interpreted by myself, left hand.  SURGEON:  Dionne Ano. Amanda Pea, M.D.  ASSISTANT:  Karie Chimera, PA-C.  COMPLICATIONS:  None.  ANESTHESIA:  Preoperative block with IV sedation.  TOURNIQUET TIME:  Just over 2 hours.  DRAINS:  Three vessel loop drains.  ESTIMATED BLOOD LOSS:  Minimal.  INDICATIONS FOR THE PROCEDURE:  A 74 year old female with the above- mentioned diagnosis.  I have counseled in regard to risks and benefits of surgery including risk of infection, bleeding, anesthesia, damage to normal structures, and failure of surgery to accomplish its intended goals of relieving symptoms and restoring function.  With this in mind, she desires to proceed.  All questions have been encouraged and answered preoperatively.  OPERATIVE PROCEDURE:  The patient seen by myself and Anesthesia, taken to the operative suite, underwent smooth induction of IV sedation. Preoperative block was in excellent working fashion.  The patient underwent a pre-scrub with Hibiclens scrub paint by myself.  She was then dried and a 10-minute surgical Betadine scrub was accomplished followed by isolation of sterile field.  Once this was complete, outline marks were made visually. Tourniquet was insufflated.  Final time-out was observed and preoperative antibiotics were given.  At this time, we performed incision longitudinal in nature based somewhat radially over the index finger.  Dissection was carried down.  The subluxed EDC tendon had an ulnar sagittal band release and following this, I entered the interval between the EIP and EDC.  The radial sagittal band was quite patulous.  Once this was performed, we then split the capsule open and identified the joint. I then resected the bony ends of the metacarpal and proximal phalanx towards nice smooth cuts.  Following this, we  then broached and sized for a size 20 implant.  I did perform the ulna lumbrical release and a volar plate release in the depths of the wound. Following this, I then placed a FiberWire stitch in the radial collateral ligament, modified Brunner Bruner stitch with Krackow modification was placed for later reattachment of radial collateral ligament.  I then irrigated copiously.  Trialed the area, x-rayed it and all looked well.  Following this, attention was turned towards the middle and ring finger. Incision was made in the third web space.  This was utilitarian incision to access the ring and middle fingers.  Middle finger was addressed with a midline split in the tendon.  The ulnar sagittal band was released and the radial sagittal band was patulous.  Following this, I then opened the capsule and entered the joint bony, saw cuts were made followed by release of the lumbrical ulnarly and release of the volar plate.  This allowed a centimeter space for the implant.  The implant was sized to a size 30 and looked quite well.  I used standard technique in U-flex broach instruments.  Following this, I then pre-placed a radial collateral ligament reconstruction stitch with FiberWire without difficulty.  This is a modified Krackow type stitch to reconstruct the radial collateral ligament.  Given a preoperative deformity in the wrist which is auto fused with some ulnar translation, I wanted to make sure that all collateral ligaments were tied and up radially.  Following this, x-rays were taken which looked excellent.  The trial implant was removed and irrigation was applied.  Following this through the similar incision, ring finger was addressed. The ulnar sagittal band was released to stop the subluxation,  I then accessed the joint.  The radial sagittal region was very patulous as expected.  Following this, I entered the capsule, split this midline identified the joint, resected the bony  ends with oscillating saw and then prepared the canals with broach and bur as I did to the index and middle.  She was broached to a size 10, this is not unexpected given the very small diameter of the ring finger metacarpal.  I released the lumbrical ulnarly and the volar plate in the depths of the wound.  Following this, I then performed very careful and cautious approach to the trial. All looked well.  X-rays look perfect, and I was pleased with the cut. It was fit and sized.  Following this, I then made a slightly ulnarly based incision overlying the small finger.  Dissection was carried down and the patient had the ulnar sagittal band released as it was causing subluxation of the [facility] construct.  The radial sagittal band once again was very patulous due to stretched out ulna translation of the extensor apparatus.  Bony resection was accomplished.  After capsule was incised, the lumbrical ulnarly was released, volar plate was released and 1 cm space for preparation of the implant was allowed.  With combination saw cuts and bur, we then performed preparation for the implant. Size 10 implant was chosen after broaching and trialing in this  looked excellent under x-ray.  Following this, we then irrigated all areas.  I should note the radial collateral ligament to the small finger was also pre-prepped with a FiberWire stitch.  Following this, all areas were very carefully and cautiously irrigated. Suction was placed and following this, I performed placement of a size 20 NeuFlex implant in the index finger.  This reduced nicely and looked excellent.  We then repaired the radial collateral ligament to the pre- placed FiberWire stitch.  Following this, I closed the capsule with FiberWire.  Following this, we then closed the extensor apparatus between the EIP and the EDC and in short of the radial sagittal band with a mattress stitch.  This allowed excellent alignment.  The radial  collateral ligament reconstruction repair was tied down nicely and there was good tension without radial collateral ligament insufficiency or attenuation.  Thus extensor realignment radial collateral ligament reconstruction and MCP arthroplasty size 20 NeuFlex were performed about the index finger. The closure looked excellent and the balance of the finger looked nice.  Following this, I then performed a similar placement with no-touch technique of a size 30 NeuFlex implant about the middle finger.  Once this was placed, we then repaired the radial collateral ligament to the pre-placed stitch and tied this against the radial periosteal tissue. The patient tolerated this quite nicely.  There were no complicating features.  The capsule was closed.  Following this, the extensor apparatus was closed in the midline incision and the sagittal band radially with short up with a mattress stitch.  Thus a size 30 NeuFlex implant was placed about the middle finger with extensor realignment and radial collateral ligament repair reconstruction.  Following this, similar no-touch technique was used to place a size 10 NeuFlex implant in the ring finger as well as a size 10 NeuFlex implant in the small finger.  No-touch technique was accomplished. Good seating of the appropriate construct was placed and following this, the radial collateral ligament of the ring finger and the radial collateral ligament of the small finger were repaired with FiberWire stitch pre- placed without difficulty.  This prevented any ulnar deviation and will hopefully __________ well for this patient to __________ in terms of the central axis for alignment.  Following this, we then repaired the capsule about the ring and small finger left hand and then repaired the patulous radial sagittal band with mattress technique.  The patient tolerated this well.  We placed her through range of motion.  She looked excellent.  AP,  lateral, and oblique x-rays were performed, examined, and interpreted by myself and looked to be excellent.  I was quite pleased with her alignment and the saw cuts.  I did perform x-rays throughout the procedure to make sure I was quite pleased.  Following this, we then irrigated copiously once again, deflated the tourniquet, and closed the skin incisions.  Small vessel loop drain was placed and these  vessel loop drains to be pulled in 24-48 hours.  She tolerated the procedure well.  She was placed in a standard rheumatoid dressing after MCP reconstruction.  I did place her in extension and made sure that we had her in a position to over correct for the radial __________ tendency.  She tolerated this quite well.  She was taken to recovery room.  She will be monitored in recovery room.  She will be admitted for IV antibiotics, pain management, and other measures.  She will be in house. She does live alone and  has little support, thus I want to make sure she is nicely attended too once we transition her to the home setting.  I have discussed these issues with Ms. Gershman at length and she understands all pre, post, and future plans.  This was an uncomplicated reconstruction and I was quite pleased with her alignment at the conclusion of the case.     Dionne Ano. Amanda Pea, M.D.     [facility]  D:  09/26/2015  T:  09/26/2015  Job:  [id number]

## 2015-09-27 NOTE — Progress Notes (Signed)
OT Cancellation Note  Patient Details Name: ELIM PEALE MRN: [id number] DOB: 08/06/1941   Cancelled Treatment:    Reason Eval/Treat Not Completed: Medical issues which prohibited therapy (pt reports dizziness). Phone interpreter was used. Pt declined to work with therapy at this time due to dizziness and not feeling well. Will check back for OT eval as time allows and pt is appropriate.   Gaye Alken M.S., OTR/L Pager: [phone]  09/27/2015, 10:42 AM

## 2015-09-27 NOTE — Progress Notes (Signed)
Patient ID: [name], female   DOB: Mar 21, 1941, 74 y.o.   MRN: [id number] Patient seen and examined. She complains of pain appropriate to her surgery  She denies new issue.  She is tolerating her diet. She is afebrile. I reviewed all issues.  Her examination shows the hand to be stable she has good refill sensation has returned. I removed her drains.  Abdomen is nontender lower stem examination is benign without signs of DVT. She has no evidence of chest pain shortness of breath or other issue.  We'll plan for continued IV antibiotics for postop surgical prophylaxis. We'll continue IV pain medicine  Patient lives alone and there is minimal help.  We will recommend case management consult to see if she applies for any home health  She has severe rheumatoid disease and has difficult capabilities.  Diagnosis: Postop day 1 status post rheumatoid reconstruction with MCP arthroplasty left hand  Dann Galicia M.D.

## 2015-09-27 NOTE — Evaluation (Signed)
Occupational Therapy Evaluation Patient Details Name: [name] MRN: [id number] DOB: 04-Sep-1941 Today's Date: 09/27/2015    History of Present Illness LEFT HAND METACARPOPHALANGEAL ARTHROPLASTY INDEX MIDDLE RING AND SMALL FINGERS WITH REALIGNMENT OF (Left). PHM: GERD, cataracts, chronic back pain, arthritis    Clinical Impression   Pt reports she was independent with ADLs PTA. Currently pt is overall min guard for mobility and min assist for ADLs. Pt c/o nausea at beginning of session; RN notified. Interpreter used throughout session; difficulties with interpreter limited eval at this time. Pt reports that she lives with her son but it is unclear at this time whether he or other family/friends will be able to provide 24/7 supervision to pt upon return home. At this time, pt would require 24/7 supervision to safely d/c home. Recommending SNF for further rehab in order to maximize independence and safety with ADLs and mobility. If pt is able to arrange 24/7 supervision for home; d/c home with [facility] may be appropriate pending progress with acute therapy. Pt would benefit from continued skilled OT in order to increase independence with ADLs, toilet transfers, and overall safety education.     Follow Up Recommendations  SNF;Supervision/Assistance - 24 hour (If pt has 24/7 S at home, may be able to d/c home with [facility])    Equipment Recommendations  Other (comment) (TBD)    Recommendations for Other Services       Precautions / Restrictions Precautions Precautions: Fall Restrictions Other Position/Activity Restrictions: No WB restrictions in orders, maintained NWB on LUE throughout session.      Mobility Bed Mobility Overal bed mobility: Needs Assistance Bed Mobility: Supine to Sit     Supine to sit: Min guard;HOB elevated        Transfers Overall transfer level: Needs assistance Equipment used: None Transfers: Sit to/from Stand Sit to Stand: Min guard          General transfer comment: Sit to stand from EOB x 1.     Balance Overall balance assessment: Needs assistance   Sitting balance-Leahy Scale: Fair     Standing balance support: No upper extremity supported Standing balance-Leahy Scale: Fair                              ADL Overall ADL's : Needs assistance/impaired Eating/Feeding: Set up;Sitting   Grooming: Supervision/safety;Sitting   Upper Body Bathing: Minimal assitance;Sitting   Lower Body Bathing: Minimal assistance;Sit to/from stand   Upper Body Dressing : Minimal assistance;Sitting   Lower Body Dressing: Minimal assistance;Sit to/from stand   Toilet Transfer: Min guard;Ambulation Toilet Transfer Details (indicate cue type and reason): Simulated toilet transfer with tranfer from EOB to chair. Pt able to ambulate in room with min guard assist Toileting- Clothing Manipulation and Hygiene: Minimal assistance;Sit to/from stand       Functional mobility during ADLs: Min guard General ADL Comments: No family present for OT eval. Used interpreter obtained from RN. Difficulties with interpreter limited evaluation. Unclear at this time whether pt is able to arrange 24/7 supervision upon return home.      Vision     Perception     Praxis      Pertinent Vitals/Pain Pain Assessment: Faces Faces Pain Scale: Hurts even more Pain Location: LUE  Pain Descriptors / Indicators: Grimacing;Guarding Pain Intervention(s): Limited activity within patient's tolerance;Monitored during session;Repositioned     Hand Dominance Right   Extremity/Trunk Assessment Upper Extremity Assessment Upper Extremity Assessment: RUE deficits/detail;LUE  deficits/detail RUE Deficits / Details: Decreased UE AROM, overall 3/4. Strength overall at least 3/5 LUE Deficits / Details: Decreased UE ROM, overall 3/4. Pt unable to perform shoulder AROM, PROM 3/4. Strength overall at least 3/5 LUE: Unable to fully assess due to  immobilization;Unable to fully assess due to pain   Lower Extremity Assessment Lower Extremity Assessment: Defer to PT evaluation   Cervical / Trunk Assessment Cervical / Trunk Assessment: Kyphotic   Communication Communication Communication: No difficulties;Prefers language other than English;Interpreter utilized   Cognition Arousal/Alertness: Awake/alert Behavior During Therapy: WFL for tasks assessed/performed Overall Cognitive Status: Difficult to assess                     General Comments       Exercises       Shoulder Instructions      Home Living Family/patient expects to be discharged to:: Unsure Living Arrangements: Children Available Help at Discharge: Available PRN/intermittently (unsure of how much son is home due to poor interpreting ) Type of Home: Apartment Home Access: Stairs to enter     Home Layout:  (unsure due to poor interpreting)     Bathroom Shower/Tub:  (unsure due to poor interpreting)   Bathroom Toilet:  (unsure due to poor interpreting)     Home Equipment: Shower seat   Additional Comments: Pts history and home living situation unclear due to difficulties with interpreter.       Prior Functioning/Environment Level of Independence: Independent             OT Diagnosis: Generalized weakness;Acute pain   OT Problem List: Decreased strength;Decreased activity tolerance;Decreased range of motion;Impaired balance (sitting and/or standing);Decreased safety awareness;Decreased knowledge of use of DME or AE;Decreased knowledge of precautions;Impaired UE functional use;Pain   OT Treatment/Interventions: Self-care/ADL training;Energy conservation;DME and/or AE instruction;Therapeutic activities;Patient/family education    OT Goals(Current goals can be found in the care plan section) Acute Rehab OT Goals Patient Stated Goal: none stated OT Goal Formulation: With patient Time For Goal Achievement: 10/11/15 Potential to Achieve  Goals: Good ADL Goals Pt Will Perform Grooming: with supervision;standing Pt Will Perform Upper Body Bathing: with supervision;sitting Pt Will Perform Lower Body Bathing: with supervision;sit to/from stand (with or without AE) Pt Will Perform Upper Body Dressing: with supervision;sitting Pt Will Perform Lower Body Dressing: with supervision;sit to/from stand (with or without AE) Pt Will Transfer to Toilet: with supervision;ambulating;regular height toilet Pt Will Perform Toileting - Clothing Manipulation and hygiene: with supervision;sit to/from stand Pt Will Perform Tub/Shower Transfer: with supervision;ambulating;shower seat  OT Frequency: Min 2X/week   Barriers to D/C: Inaccessible home environment;Decreased caregiver support  Unsure of how much supervision son can provide upon return home. Stairs in pts home.       Co-evaluation PT/OT/SLP Co-Evaluation/Treatment: Yes Reason for Co-Treatment: For patient/therapist safety   OT goals addressed during session: ADL's and self-care      End of Session Nurse Communication: Mobility status;Other (comment) (pt c/o nausea; requests medication)  Activity Tolerance: Patient tolerated treatment well Patient left: in chair;with call bell/phone within reach   Time: 0254-2706 OT Time Calculation (min): 21 min Charges:  OT General Charges $OT Visit: 1 Procedure OT Evaluation $Initial OT Evaluation Tier I: 1 Procedure G-Codes:     Gaye Alken M.S., OTR/L Pager: [phone]  09/27/2015, 1:59 PM

## 2015-09-27 NOTE — Evaluation (Signed)
Physical Therapy Evaluation Patient Details Name: [name] MRN: [id number] DOB: 11-24-1940 Today's Date: 09/27/2015   History of Present Illness  Pt admitted for Left hand realignment/reconstruction secondary to RA. PMH: GERD, back pain, cataracts  Clinical Impression  Pt non-English speaking with utilization of interpreter Saroj which was not very clear. Interpreter having additional conversation with pt and not consistently relaying exactly was was asked or stated on either end. Pt states son works but it is not a consistent full-time job but that he can't stay with her. She is very concerned about needing to go to a SNF and wanting to make sure it is clean. Pt with decreased balance, posture and activity tolerance at present who will benefit from acute therapy to maximize function and gait. If son able to assist at home consistently then return home is likely, however difficult to fully ascertain family assist available and therefore recommend [facility].     Follow Up Recommendations SNF;Supervision for mobility/OOB    Equipment Recommendations  None recommended by PT    Recommendations for Other Services       Precautions / Restrictions Precautions Precautions: Fall Restrictions Other Position/Activity Restrictions: No WB restrictions in orders, maintained NWB on LUE throughout session.      Mobility  Bed Mobility Overal bed mobility: Needs Assistance Bed Mobility: Supine to Sit     Supine to sit: Min guard;HOB elevated     General bed mobility comments: HOB grossly 20 degrees, increased time but able to pivot to EOB and elevate trunk  Transfers Overall transfer level: Needs assistance Equipment used: None Transfers: Sit to/from Stand Sit to Stand: Min guard         General transfer comment: cues for hand placement, pt initially reporting dizziness in supine but none with sitting and standing  Ambulation/Gait Ambulation/Gait assistance: Min  guard Ambulation Distance (Feet): 15 Feet Assistive device: None Gait Pattern/deviations: Step-through pattern;Decreased stride length;Trunk flexed   Gait velocity interpretation: Below normal speed for age/gender General Gait Details: pt with short shuffling steps with trunk flexed, no physical assist but close guarding secondary to posture and gait with pt at moderate fall risk due to posture and speed. Cues for upright but unclear if interpreter relaying  Stairs            Wheelchair Mobility    Modified Rankin (Stroke Patients Only)       Balance Overall balance assessment: Needs assistance   Sitting balance-Leahy Scale: Fair     Standing balance support: No upper extremity supported Standing balance-Leahy Scale: Fair                               Pertinent Vitals/Pain Pain Assessment: Faces Pain Score: 6  Faces Pain Scale: Hurts even more Pain Location: LUE Pain Descriptors / Indicators: Grimacing;Guarding Pain Intervention(s): Limited activity within patient's tolerance;Premedicated before session;Repositioned;Ice applied    Home Living Family/patient expects to be discharged to:: Private residence Living Arrangements: Children Available Help at Discharge: Available PRN/intermittently Type of Home: Apartment Home Access: Stairs to enter   Secretary/administrator of Steps: 7 Home Layout: Two level Home Equipment: Shower seat Additional Comments: interpreter and pt stating 7 stairs to go up or down unclear if split foyer     Prior Function Level of Independence: Independent         Comments: pt stating she normally cares for herself without AD. Son has an intermittent job and sometimes is  out of town     Hand Dominance   Dominant Hand: Right    Extremity/Trunk Assessment   Upper Extremity Assessment: Defer to OT evaluation RUE Deficits / Details: Decreased UE AROM, overall 3/4. Strength overall at least 3/5     LUE Deficits /  Details: Decreased UE ROM, overall 3/4. Pt unable to perform shoulder AROM, PROM 3/4. Strength overall at least 3/5   Lower Extremity Assessment: Generalized weakness      Cervical / Trunk Assessment: Kyphotic  Communication   Communication: No difficulties;Prefers language other than English;Interpreter utilized  Cognition Arousal/Alertness: Awake/alert Behavior During Therapy: WFL for tasks assessed/performed Overall Cognitive Status: Difficult to assess                      General Comments General comments (skin integrity, edema, etc.): Pt c/o nausea; RN notified and gave medication during session.     Exercises        Assessment/Plan    PT Assessment Patient needs continued PT services  PT Diagnosis Difficulty walking;Generalized weakness;Acute pain   PT Problem List Decreased strength;Decreased activity tolerance;Decreased balance;Decreased mobility;Decreased safety awareness;Pain  PT Treatment Interventions Gait training;Stair training;DME instruction;Functional mobility training;Patient/family education   PT Goals (Current goals can be found in the Care Plan section) Acute Rehab PT Goals Patient Stated Goal: none stated PT Goal Formulation: With patient Time For Goal Achievement: 10/04/15 Potential to Achieve Goals: Fair    Frequency Min 3X/week   Barriers to discharge Decreased caregiver support      Co-evaluation PT/OT/SLP Co-Evaluation/Treatment: Yes Reason for Co-Treatment: For patient/therapist safety PT goals addressed during session: Mobility/safety with mobility;Balance OT goals addressed during session: ADL's and self-care       End of Session   Activity Tolerance: Patient tolerated treatment well Patient left: in chair;with call bell/phone within reach;with nursing/sitter in room Nurse Communication: Mobility status;Precautions         Time: 8115-7262 PT Time Calculation (min) (ACUTE ONLY): 21 min   Charges:   PT  Evaluation $Initial PT Evaluation Tier I: 1 Procedure     PT G CodesDelorse Lek 09/27/2015, 2:16 PM Delaney Meigs, PT [phone]

## 2015-09-28 NOTE — Progress Notes (Signed)
Patient ID: [name], female   DOB: 1941/06/16, 74 y.o.   MRN: [id number] Patient is alert and oriented. Vital signs are stable. No evidence of complicating features.  Her physical examination shows that she is alert and oriented she walks with a nonantalgic gait. Right arm looks well hand has IV access. Left arm is in her bandage and is stable. She is neurovascularly intact with no signs of compartment syndrome.  Abdomen is nontender chest is clear HEENT is within normal limits.  She is tolerating her diet She is voiding well I reviewed her medicines and other issues.  We will plan for continued inpatient stay for pain management and transition to home versus facility.  It is been my experience with [name] that she acts he does very well at home in an independent environment. The shoes he requires a few days more in the hospital for the transition home. He'll be nice if she would allow home health to come in a few times a week to help her. This may or may not happen as in the past she did not want anyone coming. She is very resistant to any type of skilled nursing facility (nursing home).  I feel that she will likely be able to transition in 1-3 days to a home environment based upon her progress  I explained this to she and her nurse at bedside today at great length  [name] Ent M.D.

## 2015-09-29 ENCOUNTER — Encounter (HOSPITAL_COMMUNITY): Payer: Self-pay | Admitting: Orthopedic Surgery

## 2015-09-29 NOTE — Care Management Important Message (Signed)
Important Message  Patient Details  Name: [name] MRN: [id number] Date of Birth: 1941-05-03   Medicare Important Message Given:  Yes    [name] 09/29/2015, 2:34 PM

## 2015-09-29 NOTE — Progress Notes (Signed)
Physical Therapy Treatment Patient Details Name: [name] MRN: [id number] DOB: 21-Jan-1941 Today's Date: 09/29/2015    History of Present Illness Pt admitted for Left hand realignment/reconstruction secondary to RA. PMH: GERD, back pain, cataracts    PT Comments    Patient making progress toward PT goals with ability to ambulate 19ft and overall mobility level of min guard this session. Patient continues to demonsrtate decreased balance and postural control when ambulating. Continue to progress as tolerated with anticipated d/c to SNF.   Follow Up Recommendations  SNF;Supervision for mobility/OOB     Equipment Recommendations  None recommended by PT    Recommendations for Other Services       Precautions / Restrictions Precautions Precautions: Fall Restrictions Weight Bearing Restrictions: No Other Position/Activity Restrictions: No WB restrictions in orders, maintained NWB on LUE throughout session.    Mobility  Bed Mobility               General bed mobility comments: up in chair upon arrival  Transfers Overall transfer level: Needs assistance Equipment used: None Transfers: Sit to/from Stand Sit to Stand: Min guard         General transfer comment: from recliner and commode   Ambulation/Gait Ambulation/Gait assistance: Min guard Ambulation Distance (Feet): 60 Feet Assistive device: None Gait Pattern/deviations: Step-through pattern;Decreased stride length;Shuffle;Trunk flexed   Gait velocity interpretation: Below normal speed for age/gender General Gait Details: cue for upright posture and increased stride lenght with ability to correct for short distances less than 8 ft at a time; close guarding due to unsteadiness at times; no significant LOB   Stairs            Wheelchair Mobility    Modified Rankin (Stroke Patients Only)       Balance Overall balance assessment: Needs assistance Sitting-balance support: Single extremity  supported;Feet unsupported Sitting balance-Leahy Scale: Fair     Standing balance support: No upper extremity supported Standing balance-Leahy Scale: Fair                      Cognition Arousal/Alertness: Awake/alert Behavior During Therapy: WFL for tasks assessed/performed Overall Cognitive Status: Within Functional Limits for tasks assessed                      Exercises General Exercises - Lower Extremity Long Arc Quad: AROM;Strengthening;Both;15 reps Hip ABduction/ADduction: AROM;Strengthening;Both;15 reps Straight Leg Raises: AROM;Strengthening;Both;10 reps Hip Flexion/Marching: AROM;Strengthening;Both;15 reps    General Comments        Pertinent Vitals/Pain Pain Assessment: Faces Faces Pain Scale: Hurts even more Pain Location: L hand and back Pain Descriptors / Indicators: Grimacing Pain Intervention(s): Limited activity within patient's tolerance;Monitored during session;Repositioned    Home Living                      Prior Function            PT Goals (current goals can now be found in the care plan section) Acute Rehab PT Goals Patient Stated Goal: to go home tomorrow  PT Goal Formulation: With patient Time For Goal Achievement: 10/04/15 Potential to Achieve Goals: Fair Progress towards PT goals: Progressing toward goals    Frequency  Min 3X/week    PT Plan Current plan remains appropriate    Co-evaluation             End of Session Equipment Utilized During Treatment: Gait belt Activity Tolerance: Patient tolerated treatment well Patient left:  in chair;with call bell/phone within reach (bilateral UE rested on pillows)     Time: 0981-1914 PT Time Calculation (min) (ACUTE ONLY): 25 min  Charges:  $Gait Training: 8-22 mins $Therapeutic Exercise: 8-22 mins                    G CodesCarolynne Edouard, PTA [phone] 09/29/2015, 1:49 PM

## 2015-09-29 NOTE — Progress Notes (Signed)
Subjective: 3 Days Post-Op Procedure(s) (LRB): LEFT HAND METACARPOPHALANGEAL ARTHROPLASTY INDEX MIDDLE RING AND SMALL FINGERS WITH REALIGNMENT OF  (Left) EXTENSOR TENDON (Left) Patient reports pain as minimal at this point in time. She is feeling better overall. She denies nausea, vomiting, fever or chills. She was tolerating a regular diet at this juncture. She is voiding without difficulties.  Objective: Vital signs in last 24 hours: Temp:  [98.2 F (36.8 C)-98.6 F (37 C)] 98.2 F (36.8 C) (11/28 0548) Pulse Rate:  [72-96] 93 (11/28 0548) Resp:  [16-17] 16 (11/28 0548) BP: (94-117)/(47-74) 117/74 mmHg (11/28 0548) SpO2:  [99 %-100 %] 99 % (11/28 0548)  Intake/Output from previous day: 11/27 0701 - 11/28 0700 In: 120 [P.O.:120] Out: -  Intake/Output this shift: Total I/O In: 240 [P.O.:240] Out: -   No results for input(s): HGB in the last 72 hours. No results for input(s): WBC, RBC, HCT, PLT in the last 72 hours. No results for input(s): NA, K, CL, CO2, BUN, CREATININE, GLUCOSE, CALCIUM in the last 72 hours. No results for input(s): LABPT, INR in the last 72 hours.  The patient is pleasant, in no acute distress in good spirits this morning Head: Atraumatic normocephalic Chest: Respirations are present respirations are nonlabored Left upper extremity: Dressings are clean and intact, she has excellent sensation and refill about the exposed digits. There are no signs of infection or dystrophy present.  Assessment/Plan: 3 Days Post-Op Procedure(s) (LRB): LEFT HAND METACARPOPHALANGEAL ARTHROPLASTY INDEX MIDDLE RING AND SMALL FINGERS WITH REALIGNMENT OF  (Left) EXTENSOR TENDON (Left) We have discussed with the patient tentatively plans for discharge tomorrow morning. She will be discharged discharged home and will need to follow up with [place] in a proximally 10-12 days. We have discussed all issues with her at length.  Arieon Scalzo L 09/29/2015, 8:33 AM

## 2015-09-29 NOTE — Progress Notes (Signed)
Occupational Therapy Treatment Patient Details Name: [name] MRN: [id number] DOB: 10-22-1941 Today's Date: 09/29/2015    History of present illness Pt admitted for Left hand realignment/reconstruction secondary to RA. PMH: GERD, back pain, cataracts   OT comments  Pt making good progress toward OT goals. Pt able to ambulate to bathroom and perform toilet transfer, toilet hygiene, and grooming standing at the sink with min guard. Pt planning to d/c home; feel that pt is ready to d/c home from an OT standpoint. Recommending HHOT, if pt will accept, for follow up rehab to maximize independence and safety with ADLs and mobility considering pt has decreased caregiver assist upon return home. Will continue to follow pt acutely.    Follow Up Recommendations  Home health OT;Supervision/Assistance - 24 hour    Equipment Recommendations  None recommended by OT    Recommendations for Other Services      Precautions / Restrictions Precautions Precautions: Fall Restrictions Weight Bearing Restrictions: No       Mobility Bed Mobility               General bed mobility comments: Pt OOB in chair  Transfers Overall transfer level: Needs assistance Equipment used: None Transfers: Sit to/from Stand Sit to Stand: Min guard         General transfer comment: Sit to stand from chair x 1, toilet x 1    Balance Overall balance assessment: Needs assistance         Standing balance support: No upper extremity supported Standing balance-Leahy Scale: Fair                     ADL Overall ADL's : Needs assistance/impaired Eating/Feeding: Set up;Sitting   Grooming: Wash/dry hands;Min guard;Standing               Lower Body Dressing: Min guard;Sit to/from stand Lower Body Dressing Details (indicate cue type and reason): Pt able to doff/don bilateral socks. Min guard for sit to stand Toilet Transfer: Min guard;Ambulation;Comfort height toilet   Toileting-  Clothing Manipulation and Hygiene: Min guard;Sit to/from stand       Functional mobility during ADLs: Min guard General ADL Comments: No family present for OT session. Pt reports she is planning to d/c home tomorrow. Per pt, son lives at home with her but works during the day; sometimes has two days off per week. Pt reports "a girl" will be available during the day to help her as needed.        Vision                     Perception     Praxis      Cognition   Behavior During Therapy: WFL for tasks assessed/performed Overall Cognitive Status: Within Functional Limits for tasks assessed                       Extremity/Trunk Assessment               Exercises     Shoulder Instructions       General Comments      Pertinent Vitals/ Pain       Pain Assessment: Faces Faces Pain Scale: Hurts little more Pain Location: L hand Pain Descriptors / Indicators: Grimacing Pain Intervention(s): Limited activity within patient's tolerance;Monitored during session;Repositioned  Home Living  Prior Functioning/Environment              Frequency Min 2X/week     Progress Toward Goals  OT Goals(current goals can now be found in the care plan section)  Progress towards OT goals: Progressing toward goals  Acute Rehab OT Goals Patient Stated Goal: to go home tomorrow  OT Goal Formulation: With patient  Plan Discharge plan needs to be updated    Co-evaluation                 End of Session Equipment Utilized During Treatment: Gait belt   Activity Tolerance Patient tolerated treatment well   Patient Left in chair;with call bell/phone within reach   Nurse Communication          Time: 3818-2993 OT Time Calculation (min): 17 min  Charges: OT General Charges $OT Visit: 1 Procedure OT Treatments $Self Care/Home Management : 8-22 mins  Gaye Alken M.S., OTR/L Pager: [phone]   09/29/2015, 1:04 PM

## 2015-09-29 NOTE — Care Management Note (Addendum)
Case Management Note  Patient Details  Name: [name] MRN: [id number] Date of Birth: Nov 05, 1940  Subjective/Objective: 74 yr old female, s/p left hand metacarpophalangeal arthroplasty.               Action/Plan:  Patient is non english speaking. Case manager contacted her son- [name]- [phone], to discuss plans for discharge and for home health needs. Choice was offered. Referral was called to [place]. [name] states that he will be with his mom and she has a friend that calls and comes by.  Case manager requested that [facility] contact [name] to arrange for [facility] visits.   Expected Discharge Date:   09/30/15               Expected Discharge Plan:  Home w Home Health Services  In-House Referral:  NA  Discharge planning Services  CM Consult  Post Acute Care Choice:  Home Health Choice offered to:  Adult Children  DME Arranged:  N/A DME Agency:  NA  HH Arranged:  PT, OT HH Agency:  [facility]  Status of Service:  Completed, signed off  Medicare Important Message Given:  Yes Date Medicare IM Given:    Medicare IM give by:    Date Additional Medicare IM Given:    Additional Medicare Important Message give by:     If discussed at Long Length of Stay Meetings, dates discussed:    Additional Comments:  Veronica Guthrie, RN 09/29/2015, 2:38 PM

## 2015-09-30 MED ORDER — OXYCODONE-ACETAMINOPHEN 10-325 MG PO TABS: 1.0000 | ORAL_TABLET | ORAL | Status: DC | PRN

## 2015-09-30 MED ORDER — WHITE PETROLATUM GEL
Status: AC
  Filled 2015-09-30: qty 1

## 2015-09-30 NOTE — Progress Notes (Signed)
Pt ready for d/c per MD. Will be going home with hh PT/OT, set up with [facility]. RN called [facility] transportation services and arranged a ride home for pt around 12 pm. Pt's son, Genevie Cheshire, will be home when pt arrives. Driver will call RN when outside hospital. Discharge teaching and prescription was reviewed with pt, she declined use of an interpreter; all her questions were answered. Will continue to monitor.  Blunt, Latricia Heft

## 2015-09-30 NOTE — Discharge Instructions (Signed)
.  Keep bandage clean and dry.  Call for any problems.  No smoking.  Criteria for driving a car: you should be off your pain medicine for 7-8 hours, able to drive one handed(confident), thinking clearly and feeling able in your judgement to drive. Continue elevation as it will decrease swelling.  If instructed by MD move your fingers within the confines of the bandage/splint.  Use ice if instructed by your MD. Call immediately for any sudden loss of feeling in your hand/arm or change in functional abilities of the extremity. .We recommend that you to take vitamin C 1000 mg a day to promote healing. We also recommend that if you require  pain medicine that you take a stool softener to prevent constipation as most pain medicines will have constipation side effects. We recommend either Peri-Colace or Senokot and recommend that you also consider adding MiraLAX to prevent the constipation affects from pain medicine if you are required to use them. These medicines are over the counter and maybe purchased at a local pharmacy. A cup of yogurt and a probiotic can also be helpful during the recovery process as the medicines can disrupt your intestinal environment. Resume Methotrexate Saturday Dec 10th

## 2015-09-30 NOTE — Discharge Summary (Signed)
Physician Discharge Summary  Patient ID: [name] MRN: [id number] DOB/AGE: 1941-09-01 74 y.o.  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 The patient presents for reconstruction of her index, middle, ring, and small fingers with MCP arthroplasty, joint realignment and extensor realignment, and collateral ligament reconstructions plan Patient Active Problem List   Diagnosis Date Noted  . Chronic rheumatic arthritis (HCC) 09/26/2015    Discharge Diagnoses: same, s/p left hand mcp reconstruction index,middle,ring, small finger Active Problems:   Chronic rheumatic arthritis [facility])   Discharged Condition: stable  Hospital Course: The patient is a pleasant 74 year old female, well-known 84 practice. The patient has a long history of severe debilitating rheumatoid arthritis. She has. She underwent multiple reconstructions of the upper extremities. She presented for reconstruction of her index, middle ring and small finger MCPs about the left hand. We have counseled her at length prior to her surgical procedures. The patient obtained medical clearance is taken to the operative suite and underwent left index, middle, ring, small finger MCP arthroplasties was extensor realignment without difficulties. Please see operative report for full details. The patient tolerated the procedure well she was transferred to the orthopedic unit for close observation, IV antibiotics and pain management. Her postoperative course was uncomplicated. The patient does live alone but does have some help with her family, given her age and multiple medical conditions of course OT and PT will consulted. Recommendations for home health OT PT were noted. Health care was consulted and made available for her at the time of discharge and the [facility]. Patient did well postoperatively. IV  antibiotics were administered given the increased propensity for infection in a rheumatoid population. She tolerated these well. He participated in physical therapy daily. He tolerated a regular diet without difficulties. Postoperative day #4 the patient overall felt improved and was ready for discharge. Home health care PT and OT have been arranged for continuation of gait training, strengthening. The patient will be nonweightbearing with the left hand but can weight-bear through the proximal aspect of her left forearm. She can be weightbearing as tolerated with the right upper extremity. We discussed all discharge issues with the patient, all questions were encouraged and answered.  Consults: OT/PT  Treatments:PROCEDURE: 1. Arthroplasty of left index finger MCP joint size 20 NeuFlex  implant. 2. Extensor realignment left index finger. 3. Radial collateral ligament reconstruction left index finger. 4. Left middle finger size 30 MCP arthroplasty with NeuFlex implant  about the MCP region. 5. Extensor realignment extensive in nature, left middle finger  including ulnar sagittal band release and radial sagittal band  imbrication and repair. 6. Radial collateral ligament repair and reconstruction, left middle  finger. 7. Left ring finger size 10 NeuFlex arthroplasty about the MCP joint. 8. Extensor realignment with ulnar sagittal band release and radial  sagittal band repair imbrication, left ring finger. 9. Radial collateral ligament repair, left ring finger. 10.Size 10 NeuFlex MCP arthroplasty, left small finger. 11.Extensor realignment, left small finger including ulna sagittal  band release and radial sagittal band imbrication. 12.Radial collateral ligament repair reconstruction, left small  finger. 13.AP, lateral, and oblique x-rays performed, examined, and  interpreted by myself, left hand   Discharge Exam: Blood pressure 101/65, pulse 93, temperature 99 F  (37.2 C), temperature source Oral, resp. rate 16, height  (1.397 m), weight 37.677 kg (83 lb 1 oz), SpO2 100 %. Evaluation of the left upper extremity revealed that  her dressings and splint were clean dry and intact. Sensation was intact to the exposed digits. No signs of infection or dystrophy were present The patient is alert and oriented in no acute distress. The patient complains of pain in the affected upper extremity.  The patient is noted to have a normal HEENT exam. Lung fields show equal chest expansion and no shortness of breath. Abdomen exam is nontender without distention. Lower extremity examination does not show any fracture dislocation or blood clot symptoms. Pelvis is stable and the neck and back are stable and nontender.  Disposition: 01-Home or Self Care  Discharge Instructions    Call MD / Call 911    Complete by:  As directed   If you experience chest pain or shortness of breath, CALL 911 and be transported to the hospital emergency room.  If you develope a fever above 101 F, pus (white drainage) or increased drainage or redness at the wound, or calf pain, call your surgeon's office.     Constipation Prevention    Complete by:  As directed   Drink plenty of fluids.  Prune juice may be helpful.  You may use a stool softener, such as Colace (over the counter) 100 mg twice a day.  Use MiraLax (over the counter) for constipation as needed.     Diet - low sodium heart healthy    Complete by:  As directed      Discharge instructions    Complete by:  As directed   .Keep bandage clean and dry.  Call for any problems.  No smoking.  Criteria for driving a car: you should be off your pain medicine for 7-8 hours, able to drive one handed(confident), thinking clearly and feeling able in your judgement to drive. Continue elevation as it will decrease swelling.  If instructed by MD move your fingers within the confines of the bandage/splint.  Use ice if instructed by your MD. Call  immediately for any sudden loss of feeling in your hand/arm or change in functional abilities of the extremity. .We recommend that you to take vitamin C 1000 mg a day to promote healing. We also recommend that if you require  pain medicine that you take a stool softener to prevent constipation as most pain medicines will have constipation side effects. We recommend either Peri-Colace or Senokot and recommend that you also consider adding MiraLAX to prevent the constipation affects from pain medicine if you are required to use them. These medicines are over the counter and maybe purchased at a local pharmacy. A cup of yogurt and a probiotic can also be helpful during the recovery process as the medicines can disrupt your intestinal environment. Please resume Methotrexate December 10th     Increase activity slowly as tolerated    Complete by:  As directed             Medication List    TAKE these medications        cromolyn 4 % ophthalmic solution  Commonly known as:  OPTICROM  Place 1 drop into both eyes 4 (four) times daily.     cycloSPORINE 0.05 % ophthalmic emulsion  Commonly known as:  RESTASIS  Place 1 drop into both eyes 2 (two) times daily.     folic acid 1 MG tablet  Commonly known as:  FOLVITE  Take 1 mg by mouth every morning.     methotrexate 2.5 MG tablet  Take 10 mg by mouth once a week. Saturday  oxyCODONE-acetaminophen 10-325 MG tablet  Commonly known as:  PERCOCET  Take 1 tablet by mouth every 4 (four) hours as needed for pain.     sulfaSALAzine 500 MG tablet  Commonly known as:  AZULFIDINE  Take 1,000 mg by mouth 2 (two) times daily.           Follow-up Information    Follow up with [facility].   Why:  Someone from [facility] will contact you concerning start date and time for therapy.   Contact information:   [address] [place] [address] [phone]       Follow up with Karen Chafe, MD On  10/10/2015.   Specialty:  Orthopedic Surgery   Why:  follow up Dec 9th at 11:45 am and 1:00 pm with Therapy   Contact information:   [address] [place] [address] [phone]       Signed: Sheran Lawless 09/30/2015, 8:27 AM

## 2015-11-17 ENCOUNTER — Ambulatory Visit (INDEPENDENT_AMBULATORY_CARE_PROVIDER_SITE_OTHER): Payer: Medicare Other | Admitting: Ophthalmology

## 2016-01-14 ENCOUNTER — Other Ambulatory Visit: Payer: Self-pay | Admitting: Internal Medicine

## 2016-01-14 DIAGNOSIS — R911 Solitary pulmonary nodule: Secondary | ICD-10-CM

## 2016-02-17 ENCOUNTER — Other Ambulatory Visit: Payer: Medicare Other

## 2016-02-23 ENCOUNTER — Inpatient Hospital Stay: Admission: RE | Admit: 2016-02-23 | Payer: Medicare Other | Source: Ambulatory Visit

## 2016-03-04 ENCOUNTER — Encounter (HOSPITAL_COMMUNITY): Payer: Self-pay | Admitting: Emergency Medicine

## 2016-03-04 ENCOUNTER — Emergency Department (HOSPITAL_COMMUNITY)
Admission: EM | Admit: 2016-03-04 | Discharge: 2016-03-04 | Disposition: A | Discharge status: Home / Self Care | Payer: Medicare Other | Attending: Emergency Medicine | Admitting: Emergency Medicine

## 2016-03-04 DIAGNOSIS — G43009 Migraine without aura, not intractable, without status migrainosus: Secondary | ICD-10-CM | POA: Diagnosis not present

## 2016-03-04 DIAGNOSIS — Z8719 Personal history of other diseases of the digestive system: Secondary | ICD-10-CM | POA: Diagnosis not present

## 2016-03-04 DIAGNOSIS — Z792 Long term (current) use of antibiotics: Secondary | ICD-10-CM | POA: Diagnosis not present

## 2016-03-04 DIAGNOSIS — G8929 Other chronic pain: Secondary | ICD-10-CM | POA: Diagnosis not present

## 2016-03-04 DIAGNOSIS — M1990 Unspecified osteoarthritis, unspecified site: Secondary | ICD-10-CM | POA: Diagnosis not present

## 2016-03-04 DIAGNOSIS — Z79899 Other long term (current) drug therapy: Secondary | ICD-10-CM | POA: Diagnosis not present

## 2016-03-04 DIAGNOSIS — R51 Headache: Secondary | ICD-10-CM | POA: Diagnosis present

## 2016-03-04 MED ORDER — PROCHLORPERAZINE EDISYLATE 5 MG/ML IJ SOLN
10.0000 mg | Freq: Once | INTRAMUSCULAR | Status: AC
  Administered 2016-03-04: 10 mg via INTRAVENOUS
  Filled 2016-03-04: qty 2

## 2016-03-04 MED ORDER — DIPHENHYDRAMINE HCL 50 MG/ML IJ SOLN
12.5000 mg | Freq: Once | INTRAMUSCULAR | Status: AC
Start: 2016-03-04 — End: 2016-03-04
  Administered 2016-03-04: 12.5 mg via INTRAVENOUS
  Filled 2016-03-04: qty 1

## 2016-03-04 MED ORDER — SODIUM CHLORIDE 0.9 % IV BOLUS (SEPSIS)
500.0000 mL | Freq: Once | INTRAVENOUS | Status: AC
  Administered 2016-03-04: 500 mL via INTRAVENOUS

## 2016-03-04 MED ORDER — DEXAMETHASONE SODIUM PHOSPHATE 10 MG/ML IJ SOLN
10.0000 mg | Freq: Once | INTRAMUSCULAR | Status: AC
  Administered 2016-03-04: 10 mg via INTRAVENOUS
  Filled 2016-03-04: qty 1

## 2016-03-04 NOTE — ED Notes (Signed)
Pt to ER via [facility] with complaint of headache onset at 6 am this morning. Denies nausea and vomiting. VS - 102, BP 170/95, RR 18, CBG 115. A/o x4.

## 2016-03-04 NOTE — Discharge Instructions (Signed)
Migraine Headache  A migraine headache is very bad, throbbing pain on one or both sides of your head. Talk to your doctor about what things may bring on (trigger) your migraine headaches.  HOME CARE  · Only take medicines as told by your doctor.  · Lie down in a dark, quiet room when you have a migraine.  · Keep a journal to find out if certain things bring on migraine headaches. For example, write down:    What you eat and drink.    How much sleep you get.    Any change to your diet or medicines.  · Lessen how much alcohol you drink.  · Quit smoking if you smoke.  · Get enough sleep.  · Lessen any stress in your life.  · Keep lights dim if bright lights bother you or make your migraines worse.  GET HELP RIGHT AWAY IF:   · Your migraine becomes really bad.  · You have a fever.  · You have a stiff neck.  · You have trouble seeing.  · Your muscles are weak, or you lose muscle control.  · You lose your balance or have trouble walking.  · You feel like you will pass out (faint), or you pass out.  · You have really bad symptoms that are different than your first symptoms.  MAKE SURE YOU:   · Understand these instructions.  · Will watch your condition.  · Will get help right away if you are not doing well or get worse.     This information is not intended to replace advice given to you by your health care provider. Make sure you discuss any questions you have with your health care provider.     Document Released: 07/27/2008 Document Revised: 01/10/2012 Document Reviewed: 06/25/2013  Elsevier Interactive Patient Education ©2016 Elsevier Inc.

## 2016-03-04 NOTE — ED Provider Notes (Signed)
CSN: [id number]     Arrival date & time 03/04/16  1138 History   First MD Initiated Contact with Patient 03/04/16 1158     Chief Complaint  Patient presents with  . Headache     (Consider location/radiation/quality/duration/timing/severity/associated sxs/prior Treatment) The history is provided by the patient. The history is limited by a language barrier. A language interpreter was used.     75 y.o. female with a hx of RA and intermittent headaches presents to the ED noting a 1.5-2 day hx of a right sided headache that is similar in character to her previous, but more severe currently. She has associated photophobia and phonophobia. She denies any associated nausea or vomiting. She denies any trauma or falls. She denies any numbness of weakness that she has noted. She describes it as throbbing, sharp sensation that she rates as 9/10. She denies taking any medication for her sx. She states that it has gradually gotten worse since the onset. No thunderclap onset. No associated visual deficits nor known preceding sx.   Past Medical History  Diagnosis Date  . GERD (gastroesophageal reflux disease)   . Cataracts, bilateral     hx of  . Headache(784.0)   . Chronic back pain   . Arthritis     RA, sees Dr.  Ethelene Hal   Past Surgical History  Procedure Laterality Date  . Eye surgery      cataract bilateral  . Hand surgery    . Fracture surgery      under right eye  . Foot surgery    . Finger arthrodesis  06/06/2012    Procedure: ARTHRODESIS FINGER;  Surgeon: Dominica Severin, MD;  Location: [facility] OR;  Service: Orthopedics;  Laterality: Right;  RIGHT SMALL FINGER AND RING FINGER PIP FUSION WITH AUTOLOGOUS GRAFT FROM RIGHT WRIST  . Finger arthroplasty Left 09/26/2015    Procedure: LEFT HAND METACARPOPHALANGEAL ARTHROPLASTY INDEX MIDDLE RING AND SMALL FINGERS WITH REALIGNMENT OF ;  Surgeon: Dominica Severin, MD;  Location: [facility] OR;  Service: Orthopedics;  Laterality: Left;  . Repair extensor tendon Left  09/26/2015    Procedure: EXTENSOR TENDON;  Surgeon: Dominica Severin, MD;  Location: [facility] OR;  Service: Orthopedics;  Laterality: Left;   History reviewed. No pertinent family history. Social History  Substance Use Topics  . Smoking status: Never Smoker   . Smokeless tobacco: Never Used  . Alcohol Use: No   OB History    No data available     Review of Systems  Constitutional: Positive for activity change. Negative for fever, chills and appetite change.  HENT: Negative for congestion, ear pain, rhinorrhea, sinus pressure and sneezing.   Eyes: Positive for photophobia. Negative for pain and visual disturbance.  Respiratory: Negative for cough, chest tightness and shortness of breath.   Cardiovascular: Negative for chest pain.  Gastrointestinal: Negative for nausea, vomiting, abdominal pain and diarrhea.  Musculoskeletal: Negative for back pain, neck pain and neck stiffness.  Skin: Negative for rash.  Neurological: Positive for headaches. Negative for dizziness, seizures, syncope, facial asymmetry, speech difficulty, weakness, light-headedness and numbness.  All other systems reviewed and are negative.     Allergies  Review of patient's allergies indicates no known allergies.  Home Medications   Prior to Admission medications   Medication Sig Start Date End Date Taking? Authorizing Provider  cromolyn (OPTICROM) 4 % ophthalmic solution Place 1 drop into both eyes 4 (four) times daily.   Yes Historical Provider, MD  cycloSPORINE (RESTASIS) 0.05 % ophthalmic emulsion Place 1  drop into both eyes 2 (two) times daily.   Yes Historical Provider, MD  oxyCODONE-acetaminophen (PERCOCET) 10-325 MG tablet Take 1 tablet by mouth every 4 (four) hours as needed for pain. 09/30/15  Yes Karie Chimera, PA-C   BP 149/95 mmHg  Pulse 94  Temp(Src) 97.8 F (36.6 C) (Oral)  Resp 19  SpO2 100% Physical Exam  Constitutional: She is oriented to person, place, and time. She appears cachectic. No  distress.  HENT:  Head: Normocephalic and atraumatic.  Right Ear: External ear normal.  Left Ear: External ear normal.  Nose: Nose normal.  Mouth/Throat: Oropharynx is clear and moist.  Eyes: Conjunctivae and EOM are normal. Pupils are equal, round, and reactive to light.  Neck: Normal range of motion. Neck supple.  Cardiovascular: Normal rate, regular rhythm, normal heart sounds and intact distal pulses.   Pulmonary/Chest: Breath sounds normal. No respiratory distress.  Abdominal: Soft. She exhibits no distension. There is no tenderness.  Musculoskeletal: She exhibits no edema or tenderness.  Neurological: She is alert and oriented to person, place, and time. She has normal strength. No cranial nerve deficit or sensory deficit. She exhibits normal muscle tone. She displays no seizure activity. Coordination normal. GCS eye subscore is 4. GCS verbal subscore is 5. GCS motor subscore is 6.  Intact finger to nose and heel to shin  Skin: Skin is warm and dry. No rash noted. She is not diaphoretic.  Nursing note and vitals reviewed.   ED Course  Procedures (including critical care time) Labs Review Labs Reviewed - No data to display  Imaging Review No results found. I have personally reviewed and evaluated these images and lab results as part of my medical decision-making.   EKG Interpretation None      MDM  75 y.o. female with a hx of RA and a hx of similar migraine like headaches presents to the ED noting a right sided headache associated with photophobia and phonophobia. Physical exam reassuring, as above with no focal deficits noted. Otherwise she is relatively well appearing, but frail and elderly. Feel that her sx are consistent with a migraine headache. She was given a migraine cocktail as well as decadron and had significant improvement in her sx. With significant improvement, reassuring exam, and consistent history without hx of trauma do not feel that advanced imaging is  necessary at this time. She was recommended to follow up with her PCP in a few days to further discuss her intermittent headaches.   Final diagnoses:  Migraine without aura and without status migrainosus, not intractable       Francoise Ceo, DO 03/05/16 2210  Lyndal Pulley, MD 03/08/16 [phone]

## 2016-03-14 ENCOUNTER — Other Ambulatory Visit: Payer: Self-pay | Admitting: Orthopedic Surgery

## 2016-03-16 ENCOUNTER — Other Ambulatory Visit: Payer: Self-pay

## 2016-03-16 DIAGNOSIS — Z1231 Encounter for screening mammogram for malignant neoplasm of breast: Secondary | ICD-10-CM

## 2016-03-23 ENCOUNTER — Inpatient Hospital Stay
Admission: RE | Admit: 2016-03-23 | Discharge: 2016-03-23 | Disposition: A | Discharge status: Home / Self Care | Payer: Medicare Other | Source: Ambulatory Visit | Attending: Internal Medicine | Admitting: Internal Medicine

## 2016-03-31 ENCOUNTER — Ambulatory Visit
Admission: RE | Admit: 2016-03-31 | Discharge: 2016-03-31 | Disposition: A | Discharge status: Home / Self Care | Payer: Medicare Other | Source: Ambulatory Visit

## 2016-03-31 DIAGNOSIS — Z1231 Encounter for screening mammogram for malignant neoplasm of breast: Secondary | ICD-10-CM

## 2016-04-12 ENCOUNTER — Inpatient Hospital Stay (HOSPITAL_COMMUNITY)
Admission: RE | Admit: 2016-04-12 | Discharge: 2016-04-12 | Disposition: A | Discharge status: Home / Self Care | Payer: Medicare Other | Source: Ambulatory Visit

## 2016-04-12 NOTE — Pre-Procedure Instructions (Signed)
TALLULAH HOSMAN  04/12/2016      [facility] - [place], [place] - [address] [place] [address] Phone: [phone] Fax: [phone]    Your procedure is scheduled on Thursday, June 22nd             (Posted surgery time 1:00 pm - 2:00 pm)   Report to [facility] Admitting at 11:00 AM   Call this number if you have problems the morning of surgery:  [phone]   Remember:  Do not eat food or drink liquids after midnight Wednesday.   Take these medicines the morning of surgery with A SIP OF WATER : None   Do not wear jewelry, make-up or nail polish.  Do not wear lotions, powders, or perfumes.     Do not shave underarms & legs 48 hours prior to surgery.    Do not bring valuables to the hospital.  [facility] is not responsible for any belongings or valuables.  Contacts, dentures or bridgework may not be worn into surgery.  Leave your suitcase in the car.  After surgery it may be brought to your room. For patients admitted to the hospital, discharge time will be determined by your treatment team.  Patients discharged the day of surgery will not be allowed to drive home.   Name and phone number of your driver:     Please read over the following fact sheets that you were given. Pain Booklet and Surgical Site Infection Prevention

## 2016-04-20 ENCOUNTER — Encounter (HOSPITAL_COMMUNITY): Payer: Self-pay

## 2016-04-20 ENCOUNTER — Encounter (HOSPITAL_COMMUNITY)
Admission: RE | Admit: 2016-04-20 | Discharge: 2016-04-20 | Disposition: A | Discharge status: Home / Self Care | Payer: Medicare Other | Source: Ambulatory Visit | Attending: Orthopedic Surgery | Admitting: Orthopedic Surgery

## 2016-04-20 DIAGNOSIS — Z01812 Encounter for preprocedural laboratory examination: Secondary | ICD-10-CM | POA: Diagnosis present

## 2016-04-20 DIAGNOSIS — M1990 Unspecified osteoarthritis, unspecified site: Secondary | ICD-10-CM | POA: Diagnosis not present

## 2016-04-20 LAB — CBC
HCT: 34.9 % — ABNORMAL LOW (ref 36.0–46.0)
Hemoglobin: 11.2 g/dL — ABNORMAL LOW (ref 12.0–15.0)
MCH: 29.7 pg (ref 26.0–34.0)
MCHC: 32.1 g/dL (ref 30.0–36.0)
MCV: 92.6 fL (ref 78.0–100.0)
PLATELETS: 240 10*3/uL (ref 150–400)
RBC: 3.77 MIL/uL — AB (ref 3.87–5.11)
RDW: 15.4 % (ref 11.5–15.5)
WBC: 5.2 10*3/uL (ref 4.0–10.5)

## 2016-04-20 LAB — SURGICAL PCR SCREEN
MRSA, PCR: NEGATIVE
Staphylococcus aureus: NEGATIVE

## 2016-04-20 NOTE — Progress Notes (Signed)
Pt. States she understands not to eat or drink after MN and to arrive @ 11:00 AM.  Was able to get consent signed thru language line.

## 2016-04-20 NOTE — Progress Notes (Addendum)
Pt. Arrived for Pre-admit appointment without any family member or anyone else with her.Pt. Speaks very little [place].Attempted to do pre-op appointment by using [employer]preter but was still unable to get  Pt. To understand the questions we  Were asking.. She speaks [place] and there is no interpreter available locally.   Having very difficult time getting anyone at [facility] to answer the phone. Have remained on hold for 15 minutes.  Pt. States that there is no one who will be with her day of surgery, son has to work and she is planning on coming by [employer].   Message left for [facility] Dr. Carlos Levering  Surgical Coordinator to call back ASAP.  Spoke with Cordelia Pen, will draw blood work today and do PCR. If positive can treat with betadine DOS. Will do CHG cloths DOS . Cordelia Pen will relay message to Dr. Carlos Levering PA

## 2016-04-20 NOTE — Pre-Procedure Instructions (Addendum)
    [name]  04/20/2016      [facility] - [place], [place] - [address] [place] [address] Phone: [phone] Fax: [phone]    Your procedure is scheduled on 04-22-2016    Thursday  .  Report to [facility] Admitting at 11:00 A.M.   Call this number if you have problems the morning of surgery:  [phone]   Remember:  Do not eat food or drink liquids after midnight.   Take these medicines the morning of surgery with A SIP OF WATER none   Do not wear jewelry, make-up or nail polish.  Do not wear lotions, powders, or perfumes.  You may not  wear deoderant.  Do not shave 48 hours prior to surgery.     Do not bring valuables to the hospital.  [facility] is not responsible for any belongings or valuables.  Contacts, dentures or bridgework may not be worn into surgery.  Leave your suitcase in the car.  After surgery it may be brought to your room.  For patients admitted to the hospital, discharge time will be determined by your treatment team.  Patients discharged the day of surgery will not be allowed to drive home.    Special instructions:  See attached Sheet for instructions on CHG Shower  Please read over the following fact sheets that you were given. Pain Booklet

## 2016-04-21 MED ORDER — CEFAZOLIN SODIUM-DEXTROSE 2-4 GM/100ML-% IV SOLN
2.0000 g | INTRAVENOUS | Status: AC
  Administered 2016-04-22: 2 g via INTRAVENOUS
  Filled 2016-04-21: qty 100

## 2016-04-22 ENCOUNTER — Observation Stay (HOSPITAL_COMMUNITY)
Admission: RE | Admit: 2016-04-22 | Discharge: 2016-04-24 | Disposition: A | Discharge status: Home / Self Care | Payer: Medicare Other | Source: Ambulatory Visit | Attending: Orthopedic Surgery | Admitting: Orthopedic Surgery

## 2016-04-22 ENCOUNTER — Inpatient Hospital Stay (HOSPITAL_COMMUNITY): Payer: Medicare Other | Admitting: Certified Registered Nurse Anesthetist

## 2016-04-22 ENCOUNTER — Encounter (HOSPITAL_COMMUNITY)
Admission: RE | Disposition: A | Discharge status: Home / Self Care | Payer: Self-pay | Source: Ambulatory Visit | Attending: Orthopedic Surgery

## 2016-04-22 ENCOUNTER — Encounter (HOSPITAL_COMMUNITY): Payer: Self-pay | Admitting: Certified Registered Nurse Anesthetist

## 2016-04-22 DIAGNOSIS — R531 Weakness: Secondary | ICD-10-CM | POA: Diagnosis not present

## 2016-04-22 DIAGNOSIS — M05742 Rheumatoid arthritis with rheumatoid factor of left hand without organ or systems involvement: Secondary | ICD-10-CM

## 2016-04-22 DIAGNOSIS — K219 Gastro-esophageal reflux disease without esophagitis: Secondary | ICD-10-CM | POA: Diagnosis not present

## 2016-04-22 DIAGNOSIS — M05741 Rheumatoid arthritis with rheumatoid factor of right hand without organ or systems involvement: Secondary | ICD-10-CM | POA: Diagnosis present

## 2016-04-22 DIAGNOSIS — M06841 Other specified rheumatoid arthritis, right hand: Secondary | ICD-10-CM | POA: Diagnosis not present

## 2016-04-22 DIAGNOSIS — G8929 Other chronic pain: Secondary | ICD-10-CM | POA: Diagnosis not present

## 2016-04-22 DIAGNOSIS — M06842 Other specified rheumatoid arthritis, left hand: Secondary | ICD-10-CM | POA: Diagnosis not present

## 2016-04-22 DIAGNOSIS — M549 Dorsalgia, unspecified: Secondary | ICD-10-CM | POA: Diagnosis not present

## 2016-04-22 DIAGNOSIS — M069 Rheumatoid arthritis, unspecified: Secondary | ICD-10-CM | POA: Diagnosis present

## 2016-04-22 HISTORY — PX: PROXIMAL INTERPHALANGEAL FUSION (PIP): SHX6043

## 2016-04-22 SURGERY — FUSION, PIP JOINT
Anesthesia: General | Site: Finger | Laterality: Left

## 2016-04-22 MED ORDER — PROMETHAZINE HCL 25 MG/ML IJ SOLN: 6.2500 mg | INTRAMUSCULAR | Status: DC | PRN

## 2016-04-22 MED ORDER — LIDOCAINE HCL (CARDIAC) 20 MG/ML IV SOLN: INTRAVENOUS | Status: DC | PRN

## 2016-04-22 MED ORDER — PHENYLEPHRINE HCL 10 MG/ML IJ SOLN
10.0000 mg | INTRAVENOUS | Status: DC | PRN
  Administered 2016-04-22: 15 ug/min via INTRAVENOUS

## 2016-04-22 MED ORDER — 0.9 % SODIUM CHLORIDE (POUR BTL) OPTIME
TOPICAL | Status: DC | PRN
  Administered 2016-04-22: 1000 mL

## 2016-04-22 MED ORDER — PHENYLEPHRINE HCL 10 MG/ML IJ SOLN
INTRAMUSCULAR | Status: DC | PRN
  Administered 2016-04-22 (×2): 80 ug via INTRAVENOUS

## 2016-04-22 MED ORDER — BUPIVACAINE HCL (PF) 0.25 % IJ SOLN
INTRAMUSCULAR | Status: AC
  Filled 2016-04-22: qty 30

## 2016-04-22 MED ORDER — CEFAZOLIN IN D5W 1 GM/50ML IV SOLN
1.0000 g | INTRAVENOUS | Status: AC
  Administered 2016-04-22: 1 g via INTRAVENOUS

## 2016-04-22 MED ORDER — CHLORHEXIDINE GLUCONATE 4 % EX LIQD: 60.0000 mL | Freq: Once | CUTANEOUS | Status: DC

## 2016-04-22 MED ORDER — POLYETHYL GLYCOL-PROPYL GLYCOL 0.4-0.3 % OP GEL
Freq: Every day | OPHTHALMIC | Status: DC | PRN
  Filled 2016-04-22: qty 10

## 2016-04-22 MED ORDER — MORPHINE SULFATE (PF) 2 MG/ML IV SOLN: 1.0000 mg | INTRAVENOUS | Status: DC | PRN

## 2016-04-22 MED ORDER — CEFAZOLIN IN D5W 1 GM/50ML IV SOLN
1.0000 g | Freq: Three times a day (TID) | INTRAVENOUS | Status: DC
  Administered 2016-04-22 – 2016-04-24 (×5): 1 g via INTRAVENOUS
  Filled 2016-04-22 (×7): qty 50

## 2016-04-22 MED ORDER — HYDROCODONE-ACETAMINOPHEN 5-325 MG PO TABS
1.0000 | ORAL_TABLET | ORAL | Status: DC | PRN
  Administered 2016-04-22 – 2016-04-24 (×7): 1 via ORAL
  Filled 2016-04-22 (×7): qty 1

## 2016-04-22 MED ORDER — PROPOFOL 10 MG/ML IV BOLUS
INTRAVENOUS | Status: AC
Start: 2016-04-22 — End: 2016-04-22
  Filled 2016-04-22: qty 20

## 2016-04-22 MED ORDER — MIDAZOLAM HCL 2 MG/2ML IJ SOLN: 0.5000 mg | Freq: Once | INTRAMUSCULAR | Status: DC | PRN

## 2016-04-22 MED ORDER — LACTATED RINGERS IV SOLN
INTRAVENOUS | Status: DC
  Administered 2016-04-22 (×2): via INTRAVENOUS

## 2016-04-22 MED ORDER — ARTIFICIAL TEARS OP OINT
TOPICAL_OINTMENT | Freq: Every day | OPHTHALMIC | Status: DC | PRN
  Filled 2016-04-22: qty 3.5

## 2016-04-22 MED ORDER — ROPIVACAINE HCL 5 MG/ML IJ SOLN
INTRAMUSCULAR | Status: DC | PRN
  Administered 2016-04-22: 25 mL via PERINEURAL

## 2016-04-22 MED ORDER — CEFAZOLIN IN D5W 1 GM/50ML IV SOLN
INTRAVENOUS | Status: AC
  Filled 2016-04-22: qty 50

## 2016-04-22 MED ORDER — WHITE PETROLATUM GEL
Status: AC
  Administered 2016-04-22: 0.2
  Filled 2016-04-22: qty 1

## 2016-04-22 MED ORDER — FENTANYL CITRATE (PF) 100 MCG/2ML IJ SOLN: 25.0000 ug | INTRAMUSCULAR | Status: DC | PRN

## 2016-04-22 MED ORDER — PROPOFOL 10 MG/ML IV BOLUS
INTRAVENOUS | Status: DC | PRN
  Administered 2016-04-22: 80 mg via INTRAVENOUS

## 2016-04-22 MED ORDER — SENNA 8.6 MG PO TABS
1.0000 | ORAL_TABLET | Freq: Two times a day (BID) | ORAL | Status: DC
  Administered 2016-04-22 – 2016-04-24 (×4): 8.6 mg via ORAL
  Filled 2016-04-22 (×4): qty 1

## 2016-04-22 MED ORDER — BUPIVACAINE HCL (PF) 0.25 % IJ SOLN
INTRAMUSCULAR | Status: DC | PRN
  Administered 2016-04-22: 30 mL

## 2016-04-22 MED ORDER — FENTANYL CITRATE (PF) 100 MCG/2ML IJ SOLN
INTRAMUSCULAR | Status: DC | PRN
  Administered 2016-04-22 (×2): 50 ug via INTRAVENOUS

## 2016-04-22 MED ORDER — FENTANYL CITRATE (PF) 250 MCG/5ML IJ SOLN
INTRAMUSCULAR | Status: AC
  Filled 2016-04-22: qty 5

## 2016-04-22 MED ORDER — ONDANSETRON HCL 4 MG/2ML IJ SOLN
INTRAMUSCULAR | Status: DC | PRN
  Administered 2016-04-22: 4 mg via INTRAVENOUS

## 2016-04-22 MED ORDER — LACTATED RINGERS IV SOLN
INTRAVENOUS | Status: DC
  Administered 2016-04-23 (×2): via INTRAVENOUS

## 2016-04-22 MED ORDER — MEPERIDINE HCL 25 MG/ML IJ SOLN: 6.2500 mg | INTRAMUSCULAR | Status: DC | PRN

## 2016-04-22 SURGICAL SUPPLY — 67 items
BANDAGE ELASTIC 3 VELCRO ST LF (GAUZE/BANDAGES/DRESSINGS) IMPLANT
BANDAGE ELASTIC 4 VELCRO ST LF (GAUZE/BANDAGES/DRESSINGS) ×3 IMPLANT
BIT DRILL PRFL CANNULTD 2.5MM (BIT) ×1 IMPLANT
BNDG ADH 5X3 H2O RPLNT NS (GAUZE/BANDAGES/DRESSINGS) ×1
BNDG COHESIVE 1X5 TAN STRL LF (GAUZE/BANDAGES/DRESSINGS) IMPLANT
BNDG COHESIVE 3X5 WHT NS (GAUZE/BANDAGES/DRESSINGS) ×2 IMPLANT
BNDG CONFORM 2 STRL LF (GAUZE/BANDAGES/DRESSINGS) ×3 IMPLANT
BNDG CONFORM 3 STRL LF (GAUZE/BANDAGES/DRESSINGS) ×3 IMPLANT
BNDG GAUZE ELAST 4 BULKY (GAUZE/BANDAGES/DRESSINGS) ×3 IMPLANT
CAP PIN ORTHO PINK (CAP) IMPLANT
CAP PIN PROTECTOR ORTHO WHT (CAP) IMPLANT
CORDS BIPOLAR (ELECTRODE) ×3 IMPLANT
COVER SURGICAL LIGHT HANDLE (MISCELLANEOUS) ×3 IMPLANT
CUFF TOURNIQUET SINGLE 18IN (TOURNIQUET CUFF) ×3 IMPLANT
CUFF TOURNIQUET SINGLE 24IN (TOURNIQUET CUFF) IMPLANT
DRAPE OEC MINIVIEW 54X84 (DRAPES) ×3 IMPLANT
DRAPE SURG 17X23 STRL (DRAPES) ×3 IMPLANT
DRILL PROFILE CANNULATED 2.5MM (BIT) ×3
DRSG EMULSION OIL 3X3 NADH (GAUZE/BANDAGES/DRESSINGS) ×3 IMPLANT
GAUZE SPONGE 2X2 8PLY STRL LF (GAUZE/BANDAGES/DRESSINGS) IMPLANT
GAUZE SPONGE 4X4 12PLY STRL (GAUZE/BANDAGES/DRESSINGS) ×3 IMPLANT
GAUZE XEROFORM 1X8 LF (GAUZE/BANDAGES/DRESSINGS) ×3 IMPLANT
GAUZE XEROFORM 5X9 LF (GAUZE/BANDAGES/DRESSINGS) ×3 IMPLANT
GLOVE BIOGEL M 8.0 STRL (GLOVE) ×3 IMPLANT
GLOVE SS BIOGEL STRL SZ 8 (GLOVE) ×1 IMPLANT
GLOVE SUPERSENSE BIOGEL SZ 8 (GLOVE) ×2
GOWN STRL REUS W/ TWL LRG LVL3 (GOWN DISPOSABLE) ×1 IMPLANT
GOWN STRL REUS W/ TWL XL LVL3 (GOWN DISPOSABLE) ×2 IMPLANT
GOWN STRL REUS W/TWL LRG LVL3 (GOWN DISPOSABLE) ×3
GOWN STRL REUS W/TWL XL LVL3 (GOWN DISPOSABLE) ×6
K-WIRE COCR 0.9X95 (WIRE) ×3
K-WIRE SMTH SNGL TROCAR .028X4 (WIRE)
KIT BASIN OR (CUSTOM PROCEDURE TRAY) ×3 IMPLANT
KIT ROOM TURNOVER OR (KITS) ×3 IMPLANT
KWIRE COCR 0.9X95 (WIRE) IMPLANT
KWIRE SMTH SNGL TROCAR .028X4 (WIRE) IMPLANT
MANIFOLD NEPTUNE II (INSTRUMENTS) ×3 IMPLANT
NEEDLE HYPO 25GX1X1/2 BEV (NEEDLE) IMPLANT
NS IRRIG 1000ML POUR BTL (IV SOLUTION) ×3 IMPLANT
PACK ORTHO EXTREMITY (CUSTOM PROCEDURE TRAY) ×3 IMPLANT
PAD ARMBOARD 7.5X6 YLW CONV (MISCELLANEOUS) ×6 IMPLANT
PAD CAST 4YDX4 CTTN HI CHSV (CAST SUPPLIES) ×1 IMPLANT
PADDING CAST COTTON 4X4 STRL (CAST SUPPLIES) ×3
SCREW COMP HEADLESS 2.5X18 (Screw) ×3 IMPLANT
SCREW COMP HEADLESS 2.5X24 (Screw) ×6 IMPLANT
SCREW VPC 2.5X22MM (Wire) ×1 IMPLANT
SOLUTION BETADINE 4OZ (MISCELLANEOUS) ×3 IMPLANT
SPLINT FIBERGLASS 4X30 (CAST SUPPLIES) ×3 IMPLANT
SPONGE GAUZE 2X2 STER 10/PKG (GAUZE/BANDAGES/DRESSINGS)
SPONGE SCRUB IODOPHOR (GAUZE/BANDAGES/DRESSINGS) ×3 IMPLANT
SUCTION FRAZIER HANDLE 10FR (MISCELLANEOUS)
SUCTION TUBE FRAZIER 10FR DISP (MISCELLANEOUS) IMPLANT
SUT MERSILENE 4 0 P 3 (SUTURE) IMPLANT
SUT PROLENE 4 0 PS 2 18 (SUTURE) ×6 IMPLANT
SUT PROLENE 5 0 PS 2 (SUTURE) ×6 IMPLANT
SUT STEEL 2 0 (SUTURE) ×3 IMPLANT
SUT VIC AB 2-0 CT1 27 (SUTURE)
SUT VIC AB 2-0 CT1 TAPERPNT 27 (SUTURE) IMPLANT
SUT VICRYL 4-0 PS2 18IN ABS (SUTURE) ×3 IMPLANT
SYR CONTROL 10ML LL (SYRINGE) ×3 IMPLANT
TOWEL OR 17X24 6PK STRL BLUE (TOWEL DISPOSABLE) ×9 IMPLANT
TOWEL OR 17X26 10 PK STRL BLUE (TOWEL DISPOSABLE) ×3 IMPLANT
TUBE CONNECTING 12'X1/4 (SUCTIONS)
TUBE CONNECTING 12X1/4 (SUCTIONS) IMPLANT
UNDERPAD 30X30 INCONTINENT (UNDERPADS AND DIAPERS) ×3 IMPLANT
VPC SCREW 2.5X22MM (Wire) ×3 IMPLANT
WATER STERILE IRR 1000ML POUR (IV SOLUTION) IMPLANT

## 2016-04-22 NOTE — Transfer of Care (Signed)
Immediate Anesthesia Transfer of Care Note  Patient: [name]  Procedure(s) Performed: Procedure(s): PROXIMAL INTERPHALANGEAL FUSION (PIP) Index, middle, and ring (Left)  Patient Location: PACU  Anesthesia Type:General and Regional  Level of Consciousness: awake  Airway & Oxygen Therapy: Patient Spontanous Breathing and Patient connected to nasal cannula oxygen  Post-op Assessment: Report given to RN and Post -op Vital signs reviewed and stable  Post vital signs: Reviewed and stable  Last Vitals:  Filed Vitals:   04/22/16 1048 04/22/16 1049  BP:  148/79  Pulse: 87   Temp: 36.4 C   Resp: 16     Last Pain: There were no vitals filed for this visit.       Complications: No apparent anesthesia complications

## 2016-04-22 NOTE — Anesthesia Procedure Notes (Addendum)
Anesthesia Regional Block:  Axillary brachial plexus block  Pre-Anesthetic Checklist: ,, timeout performed, Correct Patient, Correct Site, Correct Laterality, Correct Procedure, Correct Position, site marked, Risks and benefits discussed,  Surgical consent,  Pre-op evaluation,  At surgeon's request and post-op pain management  Laterality: Left and Upper  Prep: chloraprep and alcohol swabs       Needles:  Injection technique: Single-shot  Needle Type: Other   (#22ga "B" bevel)    Needle Gauge: 22 and 22 G    Additional Needles:  Procedures: paresthesia technique Axillary brachial plexus block  Nerve Stimulator or Paresthesia:  Response: transient median nerve paresthesia,  Response: transient ulnar nerve paresthesia,   Additional Responses:   Narrative:  Start time: 04/22/2016 12:50 PM End time: 04/22/2016 12:58 PM Injection made incrementally with aspirations every 5 mL.  Performed by: Personally  Anesthesiologist: Jean Rosenthal, CARSWELL  Additional Notes: Pt identified in Holding room.  Monitors applied. Working IV access confirmed. Sterile prep L axilla.  #22ga "B" bevel to transient median nerve, then ulnar nerve paresthesiae.  25cc 0.5% Ropivacaine injected incrementally after negative test dose, around both paresthesiae.  Patient asymptomatic, VSS, no heme aspirated, tolerated well.  Sandford Craze, MD   Procedure Name: LMA Insertion Date/Time: 04/22/2016 1:13 PM Performed by: Dairl Ponder Pre-anesthesia Checklist: Patient identified, Timeout performed, Emergency Drugs available, Suction available and Patient being monitored Patient Re-evaluated:Patient Re-evaluated prior to inductionOxygen Delivery Method: Circle system utilized Preoxygenation: Pre-oxygenation with 100% oxygen Intubation Type: IV induction Ventilation: Mask ventilation without difficulty LMA: LMA inserted LMA Size: 3.0 Number of attempts: 1 Placement Confirmation: positive ETCO2 and breath sounds checked-  equal and bilateral Tube secured with: Tape Dental Injury: Teeth and Oropharynx as per pre-operative assessment

## 2016-04-22 NOTE — Op Note (Signed)
See [id number] Amanda Pea MD

## 2016-04-22 NOTE — Op Note (Signed)
NAMETHANVI, [name] NO.:  [id number]  MEDICAL RECORD NO.:  [id number]  LOCATION:  [id number]                        FACILITY:  [facility]  PHYSICIAN:  Dionne Ano. Jameca Chumley, M.D.DATE OF BIRTH:  Jul 12, 1941  DATE OF PROCEDURE: DATE OF DISCHARGE:                              OPERATIVE REPORT   PREOPERATIVE DIAGNOSES:  Rheumatoid arthritis with severe involvement of bilateral hands including the proximal interphalangeal joints of the index, middle, ring, and small finger with associated extensor tendon adhesions, advanced and extensive in nature causing a fixed swan-neck deformity.  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  POSTOPERATIVE DIAGNOSES:  Rheumatoid arthritis with severe involvement of bilateral hands including the proximal interphalangeal joints of the index, middle, ring, and small finger with associated extensor tendon adhesions, advanced and extensive in nature causing a fixed swan-neck deformity.  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  PROCEDURE: 1. Left index finger extensor tendon tenolysis, tenosynovectomy, and     release of adhesions. 2. Left index finger proximal interphalangeal fusion with a 22-mm     headless compression screws from [employer]. 3. Stress radiography/x-ray AP, lateral, and oblique; left index     finger. 4. Left middle finger extensor tenolysis, tenosynovectomy, extensive     in nature with release of adhesions and collateral ligament and     architecture. 5. Left middle finger proximal interphalangeal fusion with a 24 mm     micro headless screw from [employer]. 6. AP, lateral, and oblique x-rays performed, examined, and     interpreted by myself, left middle finger. 7. Left ring finger extensor tendon tenolysis, tenosynovectomy,     release of adhesions, and capsular contracture. 8. Left ring finger proximal interphalangeal arthrodesis with a 24  mm     micro headless screw from [employer]. 9. AP, lateral, and oblique x-rays performed, examined, and     interpreted by myself, left ring finger. 10.Left small finger tenolysis, tenosynovectomy, extensor apparatus     with capsular release. 11.Left small finger proximal interphalangeal arthrodesis with an 18     mm headless screw from [employer]. 12.AP, lateral, and oblique x-rays performed, examined, and     interpreted by myself, left small finger.  SURGEON:  Dionne Ano. Amanda Pea, M.D.  ASSISTANT:  Karie Chimera, PA-C.  COMPLICATIONS:  None.  ANESTHESIA:  General preoperative axillary block.  TOURNIQUET TIME:  Less than an hour.  DRAINS:  None.  INDICATIONS:  [name] is a 75 year old female with severe rheumatoid arthritis.  She presents for the above-mentioned surgical intervention. I have counseled in regard to risks and benefits of surgery and she desires to proceed.  All questions have been encouraged and answered preoperatively.  OPERATIVE PROCEDURE:  The patient was seen by myself and Anesthesia, taken to the operating suite and underwent smooth induction of anesthesia.  Time-out called.  Preoperative antibiotics were given. Prepped and draped in usual sterile fashion with Hibiclens scrub followed by Betadine scrub and paint followed by securing a sterile field.  Arm was elevated, tourniquet was insufflated.  Time-out observed of course and incision was made about the index finger.  Dissection was carried down.  Extensor tendon underwent a split tenolysis  tenosynovectomy.  She had a large amount of capsular contracture and fixed swan-neck deformity.  I released the contracture in the ligamentous architecture, and I was able to open the joint.  Following this, degenerative changes were apparent.  I then repaired the joint cup and cone, placed a guide pin, secured this, checked this, placed in proper position of approximately 35-40 degrees and placed a 22 mm  micro headless screw from [employer] achieving excellent fixation on AP, lateral, and oblique x-rays performed, examined, and interpreted by myself, all looked well.  Attention was then directed toward the middle finger.  Incision was made.  Dissection was carried down and the extensor tendon underwent tenolysis, tenosynovectomy of the fixed swan-neck deformity, capsular release was accomplished followed by cup and cone preparation of the joint.  Guidewire was placed and a 24 mm micro Acutrak screw was placed without difficulty coapting the 2 bones under compression.  AP, lateral, and oblique x-ray was performed, examined, and interpreted by myself and looked to be excellent.  She was placed at about 40 degrees of flexion.  She was irrigated copiously.  Following this, ring finger left hand was dressed with extensor tendon tenolysis, tenosynovectomy after an initial incision and skin edge dissection was carried down, tenolysis, tenosynovectomy, capsular release was performed.  Following this, I then performed placement of a 24 mm headless screw from [employer], this was a fusion at approximately 40 to 45 degrees of flexion.  AP, lateral, and oblique x-ray was performed, examined, and interpreted by myself, looked excellent.  We were able to coapt 2 cancellous bones with compression.  There were no complicating features.  Following this, we then performed very careful and cautious irrigation followed by final x-rays.  Once this was done, we performed incision about the small finger in similar fashion.  Extensor tendon underwent tenolysis, tenosynovectomy to mobilize the fixed swan-neck deformity.  I then very carefully and cautiously opened the joint.  I performed capsular release cup and comfort operation followed by placement of an 18 mm screw after we were able to coapt the cancellous surfaces against compression and placed the screw.  AP, lateral, and oblique x-rays were performed,  examined, and interpreted by myself, looked to be excellent.  Thus, index, middle, ring, and small finger PIP fusions as well as extensor tenolysis, tenosynovectomy, and x-ray were accomplished.  The wounds were treated with closure of the extensor tendon with a 4-0 Vicryl followed by closure of the skin edge with Prolene with tourniquet deflated and hemostasis secured.  The patient tolerated this well. There were no complicating features.  Sponge, needle, and instrument counts were reported as correct.  Sterile dressing and a volar splint was applied.  She tolerated this well.  We will admit her for IV antibiotics, general postop observation, and we will consult social work to see if she qualifies for any home health measures given her independent living status.  These notes have been discussed and all questions have been encouraged and answered.  Nothing, but a pleasure to see and treat [name].  I look forward for participating in her postop recovery.     Dionne Ano. Amanda Pea, M.D.     [facility]  D:  04/22/2016  T:  04/22/2016  Job:  [id number]

## 2016-04-22 NOTE — Progress Notes (Signed)
Pt is here alone for DOS. I called language resourses,a  Interpreter will cone in about 45 mins from now.

## 2016-04-22 NOTE — Progress Notes (Signed)
Orthopedic Tech Progress Note Patient Details:  [name] 1941-03-21 [id number]  Ortho Devices Type of Ortho Device: Arm sling Ortho Device/Splint Location: LUE arm Ortho Device/Splint Interventions: Ordered, Application   Jennye Moccasin 04/22/2016, 3:10 PM

## 2016-04-22 NOTE — H&P (Signed)
[name] is an 75 y.o. female.   Chief Complaint: Hand pain HPI: The patient is a 75 year old female who is well known to our practice. She has severe rheumatoid arthritis. She is previously underwent multiple upper extremity  Procedures for her rheumatoid arthritis with good outcome. She presents today for left index, middle, ring and small finger PIP fusions. We have discussed all issues with her in our office setting at length. Patient desires to proceed with surgical  Intervention.  Past Medical History  Diagnosis Date  . GERD (gastroesophageal reflux disease)   . Cataracts, bilateral     hx of  . Headache(784.0)   . Chronic back pain   . Arthritis     RA, sees Dr.  Ethelene Hal    Past Surgical History  Procedure Laterality Date  . Eye surgery      cataract bilateral  . Hand surgery    . Fracture surgery      under right eye  . Foot surgery    . Finger arthrodesis  06/06/2012    Procedure: ARTHRODESIS FINGER;  Surgeon: Dominica Severin, MD;  Location: [facility] OR;  Service: Orthopedics;  Laterality: Right;  RIGHT SMALL FINGER AND RING FINGER PIP FUSION WITH AUTOLOGOUS GRAFT FROM RIGHT WRIST  . Finger arthroplasty Left 09/26/2015    Procedure: LEFT HAND METACARPOPHALANGEAL ARTHROPLASTY INDEX MIDDLE RING AND SMALL FINGERS WITH REALIGNMENT OF ;  Surgeon: Dominica Severin, MD;  Location: [facility] OR;  Service: Orthopedics;  Laterality: Left;  . Repair extensor tendon Left 09/26/2015    Procedure: EXTENSOR TENDON;  Surgeon: Dominica Severin, MD;  Location: [facility] OR;  Service: Orthopedics;  Laterality: Left;    History reviewed. No pertinent family history. Social History:  reports that she has never smoked. She has never used smokeless tobacco. She reports that she does not drink alcohol or use illicit drugs.  Allergies: No Known Allergies  Medications Prior to Admission  Medication Sig Dispense Refill  . methotrexate (RHEUMATREX) 2.5 MG tablet Take 5 mg by mouth once a week. Wednesday    .  Polyethyl Glycol-Propyl Glycol (SYSTANE OP) Apply 1 drop to eye daily as needed (dry eyes).      Results for orders placed or performed during the hospital encounter of 04/20/16 (from the past 48 hour(s))  Surgical pcr screen     Status: None   Collection Time: 04/20/16  2:40 PM  Result Value Ref Range   MRSA, PCR NEGATIVE NEGATIVE   Staphylococcus aureus NEGATIVE NEGATIVE    Comment:        The Xpert SA Assay (FDA approved for NASAL specimens in patients over 47 years of age), is one component of a comprehensive surveillance program.  Test performance has been validated by [facility] for patients greater than or equal to 48 year old. It is not intended to diagnose infection nor to guide or monitor treatment.   CBC     Status: Abnormal   Collection Time: 04/20/16  2:41 PM  Result Value Ref Range   WBC 5.2 4.0 - 10.5 K/uL   RBC 3.77 (L) 3.87 - 5.11 MIL/uL   Hemoglobin 11.2 (L) 12.0 - 15.0 g/dL   HCT 17.4 (L) 08.1 - 44.8 %   MCV 92.6 78.0 - 100.0 fL   MCH 29.7 26.0 - 34.0 pg   MCHC 32.1 30.0 - 36.0 g/dL   RDW 18.5 63.1 - 49.7 %   Platelets 240 150 - 400 K/uL   No results found.  Review of Systems  HENT: Positive for hearing loss.   Respiratory: Negative.   Cardiovascular: Negative.   Gastrointestinal: Negative.   Genitourinary: Negative.   Musculoskeletal:       See history of the present illness  Neurological: Positive for headaches. Negative for weakness.    Blood pressure 148/79, pulse 87, temperature 97.6 F (36.4 C), temperature source Oral, resp. rate 16, weight 41.731 kg (92 lb), SpO2 100 %. Physical Exam  Examination of left upper extremities show that she has significant deformity about the hands secondary to rheumatoid arthritis severe in nature.  She has no signs of infection present, her skin is intact. Incision is intact.  Assessment/Plan Left index, middle, ring and small finger pain secondary to severe rheumatoid  History of GERD History of  rheumatoid arthritis History chronic back pain Patient Active Problem List   Diagnosis Date Noted  . Chronic rheumatic arthritis (HCC) 09/26/2015  We are planning surgery for your upper extremity. The risk and benefits of surgery to include risk of bleeding, infection, anesthesia,  damage to normal structures and failure of the surgery to accomplish its intended goals of relieving symptoms and restoring function have been discussed in detail. With this in mind we plan to proceed. I have specifically discussed with the patient the pre-and postoperative regime and the dos and don'ts and risk and benefits in great detail. Risk and benefits of surgery also include risk of dystrophy(CRPS), chronic nerve pain, failure of the healing process to go onto completion and other inherent risks of surgery The relavent the pathophysiology of the disease/injury process, as well as the alternatives for treatment and postoperative course of action has been discussed in great detail with the patient who desires to proceed.  We will do everything in our power to help you (the patient) restore function to the upper extremity. It is a pleasure to see this patient today.   Keion Neels L, PA-C 04/22/2016, 12:49 PM

## 2016-04-22 NOTE — Anesthesia Postprocedure Evaluation (Signed)
Anesthesia Post Note  Patient: [name]  Procedure(s) Performed: Procedure(s) (LRB): PROXIMAL INTERPHALANGEAL FUSION (PIP) Index, middle, and ring (Left)  Patient location during evaluation: PACU Anesthesia Type: General and Regional Level of consciousness: awake and alert, oriented and patient cooperative Pain management: pain level controlled Vital Signs Assessment: post-procedure vital signs reviewed and stable Respiratory status: spontaneous breathing, nonlabored ventilation and respiratory function stable Cardiovascular status: blood pressure returned to baseline and stable Postop Assessment: no signs of nausea or vomiting Anesthetic complications: no    Last Vitals:  Filed Vitals:   04/22/16 1515 04/22/16 1530  BP: 136/52   Pulse: 70   Temp:  36.4 C  Resp: 15     Last Pain:  Filed Vitals:   04/22/16 1538  PainSc: 0-No pain                 Blessin Kanno,E. Brad Lieurance

## 2016-04-22 NOTE — Anesthesia Preprocedure Evaluation (Addendum)
Anesthesia Evaluation  Patient identified by MRN, date of birth, ID band Patient awake    Reviewed: Allergy & Precautions, NPO status , Patient's Chart, lab work & pertinent test results  History of Anesthesia Complications Negative for: history of anesthetic complications  Airway Mallampati: I  TM Distance: >3 FB Neck ROM: Full    Dental  (+) Edentulous Upper, Dental Advisory Given   Pulmonary neg pulmonary ROS,    breath sounds clear to auscultation       Cardiovascular negative cardio ROS   Rhythm:Regular Rate:Normal     Neuro/Psych negative neurological ROS     GI/Hepatic Neg liver ROS, GERD  Controlled,  Endo/Other  negative endocrine ROS  Renal/GU negative Renal ROS     Musculoskeletal  (+) Arthritis , Rheumatoid disorders,    Abdominal   Peds  Hematology   Anesthesia Other Findings   Reproductive/Obstetrics                           Anesthesia Physical Anesthesia Plan  ASA: III  Anesthesia Plan: General   Post-op Pain Management: GA combined w/ Regional for post-op pain   Induction: Intravenous  Airway Management Planned: LMA  Additional Equipment:   Intra-op Plan:   Post-operative Plan:   Informed Consent: I have reviewed the patients History and Physical, chart, labs and discussed the procedure including the risks, benefits and alternatives for the proposed anesthesia with the patient or authorized representative who has indicated his/her understanding and acceptance.   Dental advisory given  Plan Discussed with: CRNA and Surgeon  Anesthesia Plan Comments: (Plan routine monitors, GA- LMA OK, axillary block for post op analgesia)        Anesthesia Quick Evaluation

## 2016-04-23 ENCOUNTER — Encounter (HOSPITAL_COMMUNITY): Payer: Self-pay | Admitting: Orthopedic Surgery

## 2016-04-23 DIAGNOSIS — M06842 Other specified rheumatoid arthritis, left hand: Secondary | ICD-10-CM | POA: Diagnosis not present

## 2016-04-23 MED ORDER — HYDROCODONE-ACETAMINOPHEN 5-325 MG PO TABS: 1.0000 | ORAL_TABLET | ORAL | Status: DC | PRN

## 2016-04-23 NOTE — Evaluation (Signed)
Physical Therapy Evaluation Patient Details Name: [name] MRN: [id number] DOB: [birth year] Today's Date: 04/23/2016   History of Present Illness  Pt with RA, admitted for PIP fusions of index, middle and ring fingers. Pt reports having similar sx on her R hand in the past.  Clinical Impression  Pt moving well and appears to be near her baseline level of mobility.  Pt indicates her son is able to A as needed and per OT conversation with son, it sounds like he confirmed this.  No further acute PT needs at this time.  Will sign off.      Follow Up Recommendations No PT follow up;Supervision - Intermittent    Equipment Recommendations  None recommended by PT    Recommendations for Other Services       Precautions / Restrictions Precautions Precautions: None Required Braces or Orthoses: Sling Restrictions Weight Bearing Restrictions: Yes LUE Weight Bearing: Non weight bearing      Mobility  Bed Mobility Overal bed mobility: Modified Independent             General bed mobility comments: Up in chair  Transfers Overall transfer level: Needs assistance Equipment used: None Transfers: Sit to/from Stand Sit to Stand: Supervision         General transfer comment: pt demonstrates good use of R UE with coming to/from standing.    Ambulation/Gait Ambulation/Gait assistance: Supervision Ambulation Distance (Feet): 20 Feet (and 15' x2) Assistive device: Quad cane Gait Pattern/deviations: Step-through pattern;Decreased stride length;Trunk flexed     General Gait Details: Feel this is near pt's baseline level of mobility.  pt with very flexed and scoliotic posture.  pt moves slowly and indicates she only walks short distances.    Stairs Stairs: Yes Stairs assistance: Min guard Stair Management: One rail Right;Step to pattern;Forwards Number of Stairs: 6 General stair comments: pt moves slowly, but no LOB or deficits noted.    Wheelchair Mobility    Modified  Rankin (Stroke Patients Only)       Balance Overall balance assessment: Needs assistance Sitting-balance support: No upper extremity supported;Feet supported Sitting balance-Leahy Scale: Good     Standing balance support: Single extremity supported;During functional activity;No upper extremity supported Standing balance-Leahy Scale: Fair                               Pertinent Vitals/Pain Pain Assessment: No/denies pain Faces Pain Scale: Hurts even more Pain Location: L UE Pain Descriptors / Indicators: Guarding;Grimacing Pain Intervention(s): Monitored during session;Repositioned;Premedicated before session (elevated, pt refused ice)    Home Living Family/patient expects to be discharged to:: Private residence Living Arrangements: Alone Available Help at Discharge: Family;Available PRN/intermittently (son, Genevie Cheshire) Type of Home: Apartment Home Access: Stairs to enter   [employer] of Steps: 7 Home Layout: One level Home Equipment: Shower seat;Bedside commode;Cane - quad;Grab bars - tub/shower;Hand held shower head Additional Comments: per son    Prior Function Level of Independence: Independent with assistive device(s)         Comments: son can assist as needed     Hand Dominance   Dominant Hand: Right    Extremity/Trunk Assessment   Upper Extremity Assessment: Defer to OT evaluation       LUE Deficits / Details: splinted from mid forearm to finger tips   Lower Extremity Assessment: Generalized weakness      Cervical / Trunk Assessment: Kyphotic (Scoliotic)  Communication   Communication: Prefers language other  than [place] (son interpreted by phone, speaks some [place])  Cognition Arousal/Alertness: Awake/alert Behavior During Therapy: WFL for tasks assessed/performed Overall Cognitive Status: Within Functional Limits for tasks assessed                      General Comments      Exercises         Assessment/Plan    PT Assessment Patent does not need any further PT services  PT Diagnosis Difficulty walking   PT Problem List    PT Treatment Interventions     PT Goals (Current goals can be found in the Care Plan section) Acute Rehab PT Goals Patient Stated Goal: go home PT Goal Formulation: All assessment and education complete, DC therapy    Frequency     Barriers to discharge        Co-evaluation               End of Session Equipment Utilized During Treatment: Gait belt Activity Tolerance: Patient tolerated treatment well Patient left: in chair;with call bell/phone within reach;with chair alarm set Nurse Communication: Mobility status    Functional Assessment Tool Used: Clinical Judgement Functional Limitation: Mobility: Walking and moving around Mobility: Walking and Moving Around Current Status (I9678): At least 1 percent but less than 20 percent impaired, limited or restricted Mobility: Walking and Moving Around Goal Status [phone]): At least 1 percent but less than 20 percent impaired, limited or restricted Mobility: Walking and Moving Around Discharge Status [phone]): At least 1 percent but less than 20 percent impaired, limited or restricted    Time: 1123-1145 PT Time Calculation (min) (ACUTE ONLY): 22 min   Charges:   PT Evaluation $PT Eval Low Complexity: 1 Procedure     PT G Codes:   PT G-Codes **NOT FOR INPATIENT CLASS** Functional Assessment Tool Used: Clinical Judgement Functional Limitation: Mobility: Walking and moving around Mobility: Walking and Moving Around Current Status (C5852): At least 1 percent but less than 20 percent impaired, limited or restricted Mobility: Walking and Moving Around Goal Status [phone]): At least 1 percent but less than 20 percent impaired, limited or restricted Mobility: Walking and Moving Around Discharge Status [phone]): At least 1 percent but less than 20 percent impaired, limited or restricted     Sunny Schlein, Patrick [phone] 04/23/2016, 12:22 PM

## 2016-04-23 NOTE — Evaluation (Signed)
Occupational Therapy Evaluation Patient Details Name: [name] MRN: [id number] DOB: 12-Apr-1941 Today's Date: 04/23/2016    History of Present Illness Pt with RA, admitted for PIP fusions of index, middle and ring fingers. Pt reports having similar sx on her R hand in the past.   Clinical Impression   Pt lives alone and is modified independent in mobility and self care. Communicated with pt with assist of her son by phone. Educated in edema management, sling use and compensatory strategies for ADL. Pt and son indicating understanding of all information. Recommended son stay with pt initially as she is using pain medication and is slightly unsteady and weak follow surgery.   Follow Up Recommendations  No OT follow up;Supervision/Assistance - 24 hour (initially for safety)    Equipment Recommendations  None recommended by OT    Recommendations for Other Services       Precautions / Restrictions Precautions Required Braces or Orthoses: Sling Restrictions Weight Bearing Restrictions: Yes LUE Weight Bearing: Non weight bearing      Mobility Bed Mobility Overal bed mobility: Modified Independent             General bed mobility comments: increased time, no assist, HOB flat  Transfers Overall transfer level: Needs assistance Equipment used: 1 person hand held assist Transfers: Sit to/from Stand Sit to Stand: Min guard              Balance Overall balance assessment: Needs assistance Sitting-balance support: Feet supported Sitting balance-Leahy Scale: Good       Standing balance-Leahy Scale: Poor                              ADL Overall ADL's : Needs assistance/impaired Eating/Feeding: Set up;Sitting Eating/Feeding Details (indicate cue type and reason): assist to open packages Grooming: Wash/dry hands;Supervision/safety;Standing   Upper Body Bathing: Sitting;Set up   Lower Body Bathing: Sit to/from stand;Min guard   Upper Body  Dressing : Minimal assistance;Sitting Upper Body Dressing Details (indicate cue type and reason): instructed to dress L UE first, then R Lower Body Dressing: Min guard;Sit to/from stand   Toilet Transfer: Min guard;Ambulation;Comfort height toilet   Toileting- Clothing Manipulation and Hygiene: Minimal assistance;Sit to/from stand Toileting - Clothing Manipulation Details (indicate cue type and reason): performed peri care, assist to manage gown     Functional mobility during ADLs: Min guard General ADL Comments: Educated pt and son (by phone) on elevation/edema management     Vision     Perception     Praxis      Pertinent Vitals/Pain Pain Assessment: Faces Faces Pain Scale: Hurts even more Pain Location: L UE Pain Descriptors / Indicators: Guarding;Grimacing Pain Intervention(s): Monitored during session;Repositioned;Premedicated before session (elevated, pt refused ice)     Hand Dominance Right   Extremity/Trunk Assessment Upper Extremity Assessment Upper Extremity Assessment: LUE deficits/detail LUE Deficits / Details: splinted from mid forearm to finger tips LUE: Unable to fully assess due to immobilization LUE Coordination: decreased fine motor   Lower Extremity Assessment Lower Extremity Assessment: Defer to PT evaluation   Cervical / Trunk Assessment Cervical / Trunk Assessment: Kyphotic   Communication Communication Communication: Prefers language other than English (son interpreted by phone, speaks some [place])   Cognition Arousal/Alertness: Awake/alert Behavior During Therapy: WFL for tasks assessed/performed Overall Cognitive Status: Within Functional Limits for tasks assessed  General Comments       Exercises       Shoulder Instructions      Home Living Family/patient expects to be discharged to:: Private residence Living Arrangements: Alone Available Help at Discharge: Family;Available PRN/intermittently (son,  Genevie Cheshire) Type of Home: Apartment Home Access: Stairs to enter [employer] of Steps: 7   Home Layout: One level     Bathroom Shower/Tub: Chief Strategy Officer: Standard     Home Equipment: Shower seat;Bedside commode;Cane - quad;Grab bars - tub/shower;Hand held shower head   Additional Comments: per son      Prior Functioning/Environment Level of Independence: Independent        Comments: son can assist as needed    OT Diagnosis: Generalized weakness;Acute pain   OT Problem List:     OT Treatment/Interventions:      OT Goals(Current goals can be found in the care plan section) Acute Rehab OT Goals Patient Stated Goal: go home  OT Frequency:     Barriers to D/C:            Co-evaluation              End of Session Equipment Utilized During Treatment: Gait belt (sling) Nurse Communication: Mobility status  Activity Tolerance: Patient tolerated treatment well Patient left: in chair;with call bell/phone within reach;with chair alarm set   Time: 1015-1040 OT Time Calculation (min): 25 min Charges:  OT General Charges $OT Visit: 1 Procedure OT Evaluation $OT Eval Moderate Complexity: 1 Procedure G-Codes: OT G-codes **NOT FOR INPATIENT CLASS** Functional Assessment Tool Used: clinical judgement Functional Limitation: Self care Self Care Current Status (G9924): At least 1 percent but less than 20 percent impaired, limited or restricted Self Care Discharge Status [phone]): At least 1 percent but less than 20 percent impaired, limited or restricted  Evern Bio 04/23/2016, 10:48 AM  [phone]

## 2016-04-23 NOTE — Discharge Summary (Signed)
  Please see full progress note 04/23/2016   The patient is alert and oriented in no acute distress. The patient complains of pain in the affected upper extremity.  The patient is noted to have a normal HEENT exam. Lung fields show equal chest expansion and no shortness of breath. Abdomen exam is nontender without distention. Lower extremity examination does not show any fracture dislocation or blood clot symptoms. Pelvis is stable and the neck and back are stable and nontender.   Final diagnosis status post rheumatoid reconstruction left index middle ring and small finger PIP joints  Discharge medicines Norco when necessary pain  She will RTC in 12 days  No complications during her stay  Chrstopher Malenfant M.D.

## 2016-04-23 NOTE — Discharge Instructions (Signed)

## 2016-04-23 NOTE — Progress Notes (Signed)
Patient ID: [name], female   DOB: 01/20/1941, 75 y.o.   MRN: [id number] Patient has been seen and examined. Patient has pain appropriate to his injury/process. Patient denies new complaints at this present time. I have discussed the care pathway with nursing staff. Patient is appropriate and alert.  We reviewed vital signs and intake output which are stable.  The upper extremity is neurovascularly intact. Refill is normal. There is no signs of compartment syndrome. There is no signs of dystrophy. There is normal sensation.  I have spent a  great deal of time discussing range of motion edema control and other techniques to decrease edema and promote flexion extension of the fingers. Patient understands the importance of elevation range of motion massage and other measures to lessen pain and prevent swelling.  We have also discussed immobilization to appropriate areas involved.  We have discussed with the patient shoulder range of motion to prevent adhesive capsulitis.  The remainder of the examination is normal today without complicating feature.   Patient will be discharged home. Will plan to see the patient back in the office as per discharge instructions (please see discharge instructions).  Patient had an uneventful hospital course. At the time of discharge patient is stable awake alert and oriented in no acute distress. Regular diet will be continued and has been tolerated. Patient will notify should have problems occur. There is no signs of DVT infection or other complication at this juncture.  All questions have been encouraged and answered.  We will plan for discharge tomorrow.  We will plan to discharge her on oxycodone for pain She'll return to the office in 12 days. She is tolerating her diet well. I discussed with her son all issues over the phone we'll plan for her discharge in the a.m.  I'll discharge diagnosis rheumatoid arthritis bilateral hands with surgery in  the form of index middle ring and small finger PIP fusions 04/22/2016 Please see discharge med list  Samreen Seltzer MD

## 2016-04-24 DIAGNOSIS — M06842 Other specified rheumatoid arthritis, left hand: Secondary | ICD-10-CM | POA: Diagnosis not present

## 2016-04-24 MED ORDER — WHITE PETROLATUM GEL
Status: AC
  Administered 2016-04-24: 0.2
  Filled 2016-04-24: qty 1

## 2016-04-24 NOTE — Care Management Note (Signed)
Case Management Note  Patient Details  Name: LOUIE MEADERS MRN: [id number] Date of Birth: [birth year]  Subjective/Objective: 75 yo F admitted for PIP fusions of index, middle and ring fingers               Action/Plan: assist with HH needs   Expected Discharge Date:      04/24/16            Expected Discharge Plan:  Home/Self Care  In-House Referral:     Discharge planning Services  CM Consult  Post Acute Care Choice:    Choice offered to:     DME Arranged:    DME Agency:     HH Arranged:    HH Agency:     Status of Service:  Completed, signed off  If discussed at [employer] of Stay Meetings, dates discussed:    Additional Comments: met with pt at bedside. PT is recommending intermittent supervision. Pt reports that she is going home with the support of her son.  Norina Buzzard, RN 04/24/2016, 9:39 AM

## 2016-04-24 NOTE — Progress Notes (Signed)
Pt discharge education and instructions completed with pt at bedside; pt voices understanding and denies any questions. Pt Son was called to be informed of mother's discharge and to go over education when he comes up to pick pt up declined stating "It's the same thing she had the last time" therefore he don't need to be here for an education. Pt handed her prescription as son is to meet him outside the main entrance. Pt transported off unit via wheelchair with belongings to the side. Pt discharge home with son to transport her home. Dionne Bucy RN

## 2016-06-14 ENCOUNTER — Ambulatory Visit
Admission: RE | Admit: 2016-06-14 | Discharge: 2016-06-14 | Disposition: A | Discharge status: Home / Self Care | Payer: Medicare Other | Source: Ambulatory Visit | Attending: Internal Medicine | Admitting: Internal Medicine

## 2016-06-14 DIAGNOSIS — R911 Solitary pulmonary nodule: Secondary | ICD-10-CM

## 2016-08-02 ENCOUNTER — Ambulatory Visit (INDEPENDENT_AMBULATORY_CARE_PROVIDER_SITE_OTHER): Payer: Medicare Other | Admitting: Rheumatology

## 2016-08-02 DIAGNOSIS — M545 Low back pain: Secondary | ICD-10-CM | POA: Diagnosis not present

## 2016-08-02 DIAGNOSIS — M4120 Other idiopathic scoliosis, site unspecified: Secondary | ICD-10-CM

## 2016-08-02 DIAGNOSIS — Z09 Encounter for follow-up examination after completed treatment for conditions other than malignant neoplasm: Secondary | ICD-10-CM

## 2016-08-02 DIAGNOSIS — M0579 Rheumatoid arthritis with rheumatoid factor of multiple sites without organ or systems involvement: Secondary | ICD-10-CM | POA: Diagnosis not present

## 2016-09-09 ENCOUNTER — Encounter: Payer: Self-pay | Admitting: Radiology

## 2016-09-09 DIAGNOSIS — L309 Dermatitis, unspecified: Secondary | ICD-10-CM

## 2016-09-09 NOTE — Progress Notes (Deleted)
*IMAGE* Office Visit Note  Patient: [name]             Date of Birth: 08-May-1941           MRN: [id number]             PCP: Georgann Housekeeper, MD Referring: No ref. provider found Visit Date: 09/09/2016 Occupation:@GUAROCC @    Subjective:  No chief complaint on file.   History of Present Illness: [name] is a 75 y.o. female ***   Activities of Daily Living:  Patient reports morning stiffness for *** {minute/hour:19697}.   Patient {ACTIONS;DENIES/REPORTS:21021675::"Denies"} nocturnal pain.  Difficulty dressing/grooming: {ACTIONS;DENIES/REPORTS:21021675::"Denies"} Difficulty climbing stairs: {ACTIONS;DENIES/REPORTS:21021675::"Denies"} Difficulty getting out of chair: {ACTIONS;DENIES/REPORTS:21021675::"Denies"} Difficulty using hands for taps, buttons, cutlery, and/or writing: {ACTIONS;DENIES/REPORTS:21021675::"Denies"}   No Rheumatology ROS completed.   PMFS History:  Patient Active Problem List   Diagnosis Date Noted  . Dermatitis 09/09/2016  . 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 Chronic rheumatic arthritis (HCC) 09/26/2015    Past Medical History:  Diagnosis Date  . Arthritis    RA, sees Dr.  Ethelene [name]  . Cataracts, bilateral    hx of  . Chronic back pain   . GERD (gastroesophageal reflux disease)   . Headache(784.0)     No family history on file. Past Surgical History:  Procedure Laterality Date  . EYE SURGERY     cataract bilateral  . FINGER ARTHRODESIS  06/06/2012   Procedure: ARTHRODESIS FINGER;  Surgeon: Dominica Severin, MD;  Location: [facility] OR;  Service: Orthopedics;  Laterality: Right;  RIGHT SMALL FINGER AND RING FINGER PIP FUSION WITH AUTOLOGOUS GRAFT FROM RIGHT WRIST  . FINGER ARTHROPLASTY Left 09/26/2015   Procedure: LEFT HAND METACARPOPHALANGEAL ARTHROPLASTY INDEX MIDDLE RING AND SMALL FINGERS WITH REALIGNMENT OF ;  Surgeon: Dominica Severin, MD;  Location: [facility] OR;  Service: Orthopedics;   Laterality: Left;  . FOOT SURGERY    . FRACTURE SURGERY     under right eye  . HAND SURGERY    . PROXIMAL INTERPHALANGEAL FUSION (PIP) Left 04/22/2016   Procedure: PROXIMAL INTERPHALANGEAL FUSION (PIP) Index, middle, and ring;  Surgeon: Dominica Severin, MD;  Location: [facility] OR;  Service: Orthopedics;  Laterality: Left;  . REPAIR EXTENSOR TENDON Left 09/26/2015   Procedure: EXTENSOR TENDON;  Surgeon: Dominica Severin, MD;  Location: [facility] OR;  Service: Orthopedics;  Laterality: Left;   Social History   Social History Narrative  . No narrative on file     Objective: Vital Signs: There were no vitals taken for this visit.   Physical Exam   Musculoskeletal Exam: ***  CDAI Exam: No CDAI exam completed.    Investigation: Findings:   ============================================  Consultation report from Dr.Sincerely, Kennith Gain, M.D.'s office shows the following:  Patient went to see him on 09/07/2016 for a long standing rash that she was having on her neck  Diagnosis/treatment from Dr. Marca Ancona office shows the following: Patient alone so some communication barrier; all information written down for her son who I requested call me. "Rash" on neck for uncertain time.; Apparently not symptomatic. Examination showed several 100 monomorphic half millimeter shiny micropapules extending around the "yolk" area; none umbilicated. 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 No known cause, no known treatment. Some cases have been reported to spontaneous clear for months to years. I did not choose to obtain a confirmatory biopsy since this would not lead to improved therapeutic options.  Sincerely, StuartTafeen, M.D.  ============================================    Imaging: No results found.  Speciality Comments: No specialty comments available.    Procedures:  No procedures performed Allergies: Patient has no known  allergies.   Assessment / Plan: Visit Diagnoses: Dermatitis    Orders: No orders of the defined types were placed in this encounter.  No orders of the defined types were placed in this encounter.   Face-to-face time spent with patient was *** minutes. 50% of time was spent in counseling and coordination of care.  Follow-Up Instructions: No Follow-up on file.

## 2016-09-24 ENCOUNTER — Other Ambulatory Visit (INDEPENDENT_AMBULATORY_CARE_PROVIDER_SITE_OTHER): Payer: Self-pay | Admitting: Rheumatology

## 2016-10-14 ENCOUNTER — Other Ambulatory Visit: Payer: Self-pay | Admitting: *Deleted

## 2016-10-14 ENCOUNTER — Other Ambulatory Visit: Payer: Self-pay | Admitting: Rheumatology

## 2016-10-14 DIAGNOSIS — E559 Vitamin D deficiency, unspecified: Secondary | ICD-10-CM

## 2016-10-14 DIAGNOSIS — Z79899 Other long term (current) drug therapy: Secondary | ICD-10-CM

## 2016-10-14 LAB — CBC WITH DIFFERENTIAL/PLATELET
BASOS PCT: 0 %
Basophils Absolute: 0 cells/uL (ref 0–200)
EOS PCT: 0 %
Eosinophils Absolute: 0 cells/uL — ABNORMAL LOW (ref 15–500)
HCT: 32.6 % — ABNORMAL LOW (ref 35.0–45.0)
HEMOGLOBIN: 10.7 g/dL — AB (ref 11.7–15.5)
LYMPHS ABS: 1364 {cells}/uL (ref 850–3900)
Lymphocytes Relative: 22 %
MCH: 30 pg (ref 27.0–33.0)
MCHC: 32.8 g/dL (ref 32.0–36.0)
MCV: 91.3 fL (ref 80.0–100.0)
MPV: 9.1 fL (ref 7.5–12.5)
Monocytes Absolute: 744 cells/uL (ref 200–950)
Monocytes Relative: 12 %
NEUTROS PCT: 66 %
Neutro Abs: 4092 cells/uL (ref 1500–7800)
Platelets: 224 10*3/uL (ref 140–400)
RBC: 3.57 MIL/uL — AB (ref 3.80–5.10)
RDW: 15.2 % — ABNORMAL HIGH (ref 11.0–15.0)
WBC: 6.2 10*3/uL (ref 3.8–10.8)

## 2016-10-14 LAB — COMPLETE METABOLIC PANEL WITH GFR
ALBUMIN: 4.2 g/dL (ref 3.6–5.1)
ALT: 12 U/L (ref 6–29)
AST: 20 U/L (ref 10–35)
Alkaline Phosphatase: 47 U/L (ref 33–130)
BUN: 20 mg/dL (ref 7–25)
CHLORIDE: 104 mmol/L (ref 98–110)
CO2: 23 mmol/L (ref 20–31)
Calcium: 9.2 mg/dL (ref 8.6–10.4)
Creat: 0.83 mg/dL (ref 0.60–0.93)
GFR, Est African American: 79 mL/min (ref >=60)
GFR, Est Non African American: 68 mL/min (ref >=60)
GLUCOSE: 84 mg/dL (ref 65–99)
POTASSIUM: 4.2 mmol/L (ref 3.5–5.3)
SODIUM: 138 mmol/L (ref 135–146)
Total Bilirubin: 0.8 mg/dL (ref 0.2–1.2)
Total Protein: 7.3 g/dL (ref 6.1–8.1)

## 2016-10-15 LAB — VITAMIN D 25 HYDROXY (VIT D DEFICIENCY, FRACTURES): Vit D, 25-Hydroxy: 68 ng/mL (ref 30–100)

## 2016-10-15 NOTE — Progress Notes (Signed)
Stable except anemia

## 2016-10-19 NOTE — Progress Notes (Signed)
I spoke with the patient's son and discussed the CMP with GFR and CBC with differential.Patient was complaining when she got her blood drawn that she is only getting her vitamin D 1 tablet per month and she cannot take it every week. I advised the son to tell his mom that she should only take 1 vitamin D tablet per month.

## 2016-11-16 ENCOUNTER — Other Ambulatory Visit: Payer: Self-pay | Admitting: Rheumatology

## 2016-11-19 NOTE — Telephone Encounter (Signed)
Mr [name] wants her to continue to use once a month, ok to refill per Mr [name]

## 2017-01-11 NOTE — Progress Notes (Deleted)
Office Visit Note  Patient: [name]             Date of Birth: 06/22/1941           MRN: [id number]             PCP: Georgann Housekeeper, MD Referring: Georgann Housekeeper, MD Visit Date: 01/12/2017 Occupation: @GUAROCC @    Subjective:  No chief complaint on file.   History of Present Illness: [name] is a 76 y.o. female ***   Activities of Daily Living:  Patient reports morning stiffness for *** {minute/hour:19697}.   Patient {ACTIONS;DENIES/REPORTS:21021675::"Denies"} nocturnal pain.  Difficulty dressing/grooming: {ACTIONS;DENIES/REPORTS:21021675::"Denies"} Difficulty climbing stairs: {ACTIONS;DENIES/REPORTS:21021675::"Denies"} Difficulty getting out of chair: {ACTIONS;DENIES/REPORTS:21021675::"Denies"} Difficulty using hands for taps, buttons, cutlery, and/or writing: {ACTIONS;DENIES/REPORTS:21021675::"Denies"}   No Rheumatology ROS completed.   PMFS History:  Patient Active Problem List   Diagnosis Date Noted  . Dermatitis 09/09/2016  . 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 Chronic rheumatic arthritis (HCC) 09/26/2015    Past Medical History:  Diagnosis Date  . Arthritis    RA, sees Dr.  09/28/2015  . Cataracts, bilateral    hx of  . Chronic back pain   . GERD (gastroesophageal reflux disease)   . Headache(784.0)     No family history on file. Past Surgical History:  Procedure Laterality Date  . EYE SURGERY     cataract bilateral  . FINGER ARTHRODESIS  06/06/2012   Procedure: ARTHRODESIS FINGER;  Surgeon: 08/06/2012, MD;  Location: [facility] OR;  Service: Orthopedics;  Laterality: Right;  RIGHT SMALL FINGER AND RING FINGER PIP FUSION WITH AUTOLOGOUS GRAFT FROM RIGHT WRIST  . FINGER ARTHROPLASTY Left 09/26/2015   Procedure: LEFT HAND METACARPOPHALANGEAL ARTHROPLASTY INDEX MIDDLE RING AND SMALL FINGERS WITH REALIGNMENT OF ;  Surgeon: 09/28/2015, MD;  Location: [facility] OR;  Service: Orthopedics;  Laterality:  Left;  . FOOT SURGERY    . FRACTURE SURGERY     under right eye  . HAND SURGERY    . PROXIMAL INTERPHALANGEAL FUSION (PIP) Left 04/22/2016   Procedure: PROXIMAL INTERPHALANGEAL FUSION (PIP) Index, middle, and ring;  Surgeon: 04/24/2016, MD;  Location: [facility] OR;  Service: Orthopedics;  Laterality: Left;  . REPAIR EXTENSOR TENDON Left 09/26/2015   Procedure: EXTENSOR TENDON;  Surgeon: 09/28/2015, MD;  Location: [facility] OR;  Service: Orthopedics;  Laterality: Left;   Social History   Social History Narrative  . No narrative on file     Objective: Vital Signs: There were no vitals taken for this visit.   Physical Exam   Musculoskeletal Exam: ***  CDAI Exam: No CDAI exam completed.    Investigation: Findings:  April 18, 2013 - Patient has PLQ lesions in both eyes.    Imaging: No results found.  Speciality Comments: No specialty comments available.    Procedures:  No procedures performed Allergies: Patient has no known allergies.   Assessment / Plan:     Visit Diagnoses: No diagnosis found.    Orders: No orders of the defined types were placed in this encounter.  No orders of the defined types were placed in this encounter.   Face-to-face time spent with patient was *** minutes. 50% of time was spent in counseling and coordination of care.  Follow-Up Instructions: No Follow-up on file.   Zephyr Sausedo, 01-24-1984  Note - This record has been created using [place].  Chart creation errors have been sought, but may not always  have been located.  Such creation errors do not reflect on  the standard of medical care.

## 2017-01-12 ENCOUNTER — Ambulatory Visit: Payer: Medicare Other | Admitting: Rheumatology

## 2017-03-07 DIAGNOSIS — M810 Age-related osteoporosis without current pathological fracture: Secondary | ICD-10-CM | POA: Insufficient documentation

## 2017-03-07 DIAGNOSIS — M4120 Other idiopathic scoliosis, site unspecified: Secondary | ICD-10-CM | POA: Insufficient documentation

## 2017-03-07 DIAGNOSIS — Z79899 Other long term (current) drug therapy: Secondary | ICD-10-CM | POA: Insufficient documentation

## 2017-03-07 DIAGNOSIS — T372X5A Adverse effect of antimalarials and drugs acting on other blood protozoa, initial encounter: Secondary | ICD-10-CM

## 2017-03-07 DIAGNOSIS — M40209 Unspecified kyphosis, site unspecified: Secondary | ICD-10-CM | POA: Insufficient documentation

## 2017-03-07 DIAGNOSIS — R918 Other nonspecific abnormal finding of lung field: Secondary | ICD-10-CM | POA: Insufficient documentation

## 2017-03-07 DIAGNOSIS — H35389 Toxic maculopathy, unspecified eye: Secondary | ICD-10-CM | POA: Insufficient documentation

## 2017-03-07 DIAGNOSIS — R7612 Nonspecific reaction to cell mediated immunity measurement of gamma interferon antigen response without active tuberculosis: Secondary | ICD-10-CM | POA: Insufficient documentation

## 2017-03-07 DIAGNOSIS — Z8719 Personal history of other diseases of the digestive system: Secondary | ICD-10-CM | POA: Insufficient documentation

## 2017-03-07 NOTE — Progress Notes (Signed)
Office Visit Note  Patient: [name]             Date of Birth: 1941-08-24           MRN: [id number]             PCP: Georgann Housekeeper, MD Referring: Georgann Housekeeper, MD Visit Date: 03/08/2017 Occupation: @GUAROCC @    Subjective:  Follow-up   History of Present Illness: [name] is a 76 y.o. female  Last seen 08/02/2016.  Patient has a history of rheumatoid arthritis. She had Plaquenil toxicity and so well but we switched her to methotrexate 4 per week and sulfasalazine 500 mg twice a day. She is tolerating this combination of medications well and is doing very well with her joints. There is old damage but there is no new damage going on.  She does have some occasional feet pain and swelling but that's from peripheral edema and osteoarthritis issues.  No joint pain stiffness and swelling on previous exams when she was on methotrexate and sulfasalazine dual therapy combination.   Patient is requesting SCAT form to be filled out(this is transportation paperwork for [facility] residence needing assistance to travel to doctor's appointments. Patient certainly qualifies for health reasons/rheumatological reasons, language barrier and I filled that out for the patient.  Activities of Daily Living:  Patient reports morning stiffness for 30 minutes.   Patient Reports nocturnal pain.  Difficulty dressing/grooming: Reports Difficulty climbing stairs: Reports Difficulty getting out of chair: Reports Difficulty using hands for taps, buttons, cutlery, and/or writing: Reports   Review of Systems  Constitutional: Negative for fatigue.  HENT: Negative for mouth sores and mouth dryness.   Eyes: Negative for dryness.  Respiratory: Negative for shortness of breath.   Gastrointestinal: Negative for constipation and diarrhea.  Musculoskeletal: Negative for myalgias and myalgias.  Skin: Negative for sensitivity to sunlight.  Psychiatric/Behavioral: Negative for decreased  concentration and sleep disturbance.    PMFS History:  Patient Active Problem List   Diagnosis Date Noted  . 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 High risk medication use 03/07/2017  . History of gastroesophageal reflux (GERD) 03/07/2017  . Kyphosis 03/07/2017  . Idiopathic scoliosis 03/07/2017  . Age-related osteoporosis without current pathological fracture 03/07/2017  . 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 Pulmonary nodules 03/07/2017  . Dermatitis 09/09/2016  . 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 Arthritis    RA, sees Dr.  Ethelene Hal  . Cataracts, bilateral    hx of  . Chronic back pain   . GERD (gastroesophageal reflux disease)   . Headache(784.0)     History reviewed. No pertinent family history. Past Surgical History:  Procedure Laterality Date  . EYE SURGERY     cataract bilateral  . FINGER ARTHRODESIS  06/06/2012   Procedure: ARTHRODESIS FINGER;  Surgeon: Dominica Severin, MD;  Location: [facility] OR;  Service: Orthopedics;  Laterality: Right;  RIGHT SMALL FINGER AND RING FINGER PIP FUSION WITH AUTOLOGOUS GRAFT FROM RIGHT WRIST  . FINGER ARTHROPLASTY Left 09/26/2015   Procedure: LEFT HAND METACARPOPHALANGEAL ARTHROPLASTY INDEX MIDDLE RING AND SMALL FINGERS WITH REALIGNMENT OF ;  Surgeon: Dominica Severin, MD;  Location: [facility] OR;  Service: Orthopedics;  Laterality: Left;  . FOOT SURGERY    . FRACTURE SURGERY     under right eye  .  HAND SURGERY    . PROXIMAL INTERPHALANGEAL FUSION (PIP) Left 04/22/2016   Procedure: PROXIMAL INTERPHALANGEAL FUSION (PIP) Index, middle, and ring;  Surgeon: Dominica Severin, MD;  Location: [facility] OR;  Service: Orthopedics;  Laterality: Left;  . REPAIR EXTENSOR TENDON Left 09/26/2015   Procedure: EXTENSOR TENDON;  Surgeon: Dominica Severin, MD;   Location: [facility] OR;  Service: Orthopedics;  Laterality: Left;   Social History   Social History Narrative  . No narrative on file     Objective: Vital Signs: BP 123/77 (BP Location: Left Arm, Patient Position: Sitting, Cuff Size: Small)   Pulse 98   Resp 16   Ht 5' (1.524 m)   Wt 90 lb (40.8 kg)   BMI 17.58 kg/m    Physical Exam  Constitutional: She is oriented to person, place, and time. She appears well-developed and well-nourished.  HENT:  Head: Normocephalic and atraumatic.  Eyes: EOM are normal. Pupils are equal, round, and reactive to light.  Cardiovascular: Normal rate, regular rhythm and normal heart sounds.  Exam reveals no gallop and no friction rub.   No murmur heard. Pulmonary/Chest: Effort normal and breath sounds normal. She has no wheezes. She has no rales.  Abdominal: Soft. Bowel sounds are normal. She exhibits no distension. There is no tenderness. There is no guarding. No hernia.  Musculoskeletal: Normal range of motion. She exhibits no edema, tenderness or deformity.  Lymphadenopathy:    She has no cervical adenopathy.  Neurological: She is alert and oriented to person, place, and time. Coordination normal.  Skin: Skin is warm and dry. Capillary refill takes less than 2 seconds. No rash noted.  Psychiatric: She has a normal mood and affect. Her behavior is normal.  Nursing note and vitals reviewed.    Musculoskeletal Exam:  Full range of motion of all joints Grip strength is equal and strong bilaterally Fiber myalgia tender points are absent  CDAI Exam: CDAI Homunculus Exam:   Joint Counts:  CDAI Tender Joint count: 0 CDAI Swollen Joint count: 0  Global Assessments:  Patient Global Assessment: 6 Provider Global Assessment: 6  CDAI Calculated Score: 12    Investigation: Findings:   Labs from September 16, 2011 show CBC with differential normal except slight anemia; sedimentation rate is increased at 55; CMP is normal acute hepatitis panel is  negative; vitamin D is normal at 35; urine is negative; SPEP shows M spike is negative; ANA is negative; rheumatoid factor is increased at 32; CCP is increased at 300.  CBC Latest Ref Rng & Units 10/14/2016 04/20/2016 09/18/2015  WBC 3.8 - 10.8 K/uL 6.2 5.2 5.6  Hemoglobin 11.7 - 15.5 g/dL 10.7(L) 11.2(L) 11.4(L)  Hematocrit 35.0 - 45.0 % 32.6(L) 34.9(L) 33.9(L)  Platelets 140 - 400 K/uL 224 240 212    CMP Latest Ref Rng & Units 10/14/2016 09/18/2015 03/21/2015  Glucose 65 - 99 mg/dL 84 77 92  BUN 7 - 25 mg/dL 20 13 16(X)  Creatinine 0.60 - 0.93 mg/dL 0.96 0.45 4.09  Sodium 135 - 146 mmol/L 138 138 136  Potassium 3.5 - 5.3 mmol/L 4.2 4.0 3.9  Chloride 98 - 110 mmol/L 104 103 102  CO2 20 - 31 mmol/L 23 25 26   Calcium 8.6 - 10.4 mg/dL 9.2 9.6 9.1  Total Protein 6.1 - 8.1 g/dL 7.3 - 8.0  Total Bilirubin 0.2 - 1.2 mg/dL 0.8 - 0.6  Alkaline Phos 33 - 130 U/L 47 - 65  AST 10 - 35 U/L 20 - 29  ALT 6 -  29 U/L 12 - 16    Imaging: No results found.                 Speciality Comments: No specialty comments available.    Procedures:  No procedures performed Allergies: Patient has no known allergies.   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 Old damage is causing patient some joint discomfort but there is no active synovitis on  examination.  #2: High risk prescription. Methotrexate 4 per week Folic acid 1 per day. Sulfasalazine 500 mg twice a day. This combination therapy is working well for the patient. She's having adequate response. (Failed Plaquenil due to Plaquenil toxicity so we will no longer give her Plaquenil). Patient's labs are past 2 and we will do CBC with differential CMP with GFR today.  #3: Note: History of TB gold test positive and was treated in 2014 with rifampin.  #4: History of scoliosis.  #5: History of kyphosis  #6: Gait abnormality. Ambulates with walker and occasionally ambulates with cane.  #7: Peripheral edema. Patient is seeing her PCP and cardiologist Dr. Eula Listen. I advised her to follow with them as needed  #8: [facility]. I made changes in her chart to reflect that. I sent in prescriptions today for methotrexate 90 day supply And sulfasalazine 90 day supply with a refill  #9: Return to clinic in 3 months. Note that it is difficult for the patient to get around and I filled out her transportation April work. Please see today's chart for full details on copies of that paperwork. In the interest of making a convenient for the patient, I will see the patient every 3 months and do labs at each visit. H lab will be CBC with differential and CMP with GFR. Orders: Orders Placed This Encounter  Procedures  . CBC with Differential/Platelet  . COMPLETE METABOLIC PANEL WITH GFR   Meds ordered this encounter  Medications  . folic acid (FOLVITE) 1 MG tablet    Sig: Take 1 tablet (1 mg total) by mouth daily.    Dispense:  90 tablet    Refill:  4    Order Specific Question:   Supervising Provider    Answer:   Pollyann Savoy [[id number]]  . methotrexate (RHEUMATREX) 2.5 MG tablet    Sig: Take 4 tablets (10 mg total) by mouth once a week. Saturday    Dispense:  48 tablet    Refill:  0    Order Specific Question:   Supervising Provider    Answer:   Pollyann Savoy [[id number]]  . sulfaSALAzine (AZULFIDINE) 500 MG tablet    Sig: Take 1 tablet (500 mg total) by mouth 2 (two) times daily.    Dispense:  180 tablet    Refill:  1    Order Specific Question:   Supervising Provider    Answer:   Pollyann Savoy [phone]    Face-to-face time spent with patient was  30 minutes. 50% of time was spent in counseling and coordination of care.  Follow-Up Instructions: Return in about 3 months (around 06/08/2017) for RA,MTX 4/WK, FOLIC 1/D,SSZ 940 BID,FEET SWELLING,.   Tawni Pummel, PA-C  Note - This record has been created using [employer].  Chart creation errors have been sought, but may not always  have been located. Such creation errors do not reflect on  the standard of medical care.

## 2017-03-08 ENCOUNTER — Ambulatory Visit: Payer: Medicare Other | Admitting: Rheumatology

## 2017-03-08 ENCOUNTER — Encounter: Payer: Self-pay | Admitting: Rheumatology

## 2017-03-08 ENCOUNTER — Ambulatory Visit (INDEPENDENT_AMBULATORY_CARE_PROVIDER_SITE_OTHER): Payer: Medicare Other | Admitting: Rheumatology

## 2017-03-08 VITALS — BP 123/77 | HR 98 | Resp 16 | Ht 60.0 in | Wt 90.0 lb

## 2017-03-08 DIAGNOSIS — R918 Other nonspecific abnormal finding of lung field: Secondary | ICD-10-CM

## 2017-03-08 DIAGNOSIS — T372X5A Adverse effect of antimalarials and drugs acting on other blood protozoa, initial encounter: Secondary | ICD-10-CM

## 2017-03-08 DIAGNOSIS — Z8719 Personal history of other diseases of the digestive system: Secondary | ICD-10-CM | POA: Diagnosis not present

## 2017-03-08 DIAGNOSIS — Z79899 Other long term (current) drug therapy: Secondary | ICD-10-CM

## 2017-03-08 DIAGNOSIS — M0579 Rheumatoid arthritis with rheumatoid factor of multiple sites without organ or systems involvement: Secondary | ICD-10-CM

## 2017-03-08 DIAGNOSIS — M05741 Rheumatoid arthritis with rheumatoid factor of right hand without organ or systems involvement: Secondary | ICD-10-CM | POA: Diagnosis not present

## 2017-03-08 DIAGNOSIS — M810 Age-related osteoporosis without current pathological fracture: Secondary | ICD-10-CM

## 2017-03-08 DIAGNOSIS — M05742 Rheumatoid arthritis with rheumatoid factor of left hand without organ or systems involvement: Secondary | ICD-10-CM

## 2017-03-08 DIAGNOSIS — M40299 Other kyphosis, site unspecified: Secondary | ICD-10-CM

## 2017-03-08 DIAGNOSIS — M4120 Other idiopathic scoliosis, site unspecified: Secondary | ICD-10-CM

## 2017-03-08 DIAGNOSIS — R7612 Nonspecific reaction to cell mediated immunity measurement of gamma interferon antigen response without active tuberculosis: Secondary | ICD-10-CM

## 2017-03-08 DIAGNOSIS — H35389 Toxic maculopathy, unspecified eye: Secondary | ICD-10-CM

## 2017-03-08 LAB — CBC WITH DIFFERENTIAL/PLATELET
BASOS PCT: 0 %
Basophils Absolute: 0 cells/uL (ref 0–200)
EOS ABS: 42 {cells}/uL (ref 15–500)
EOS PCT: 1 %
HCT: 33.3 % — ABNORMAL LOW (ref 35.0–45.0)
Hemoglobin: 10.6 g/dL — ABNORMAL LOW (ref 11.7–15.5)
Lymphocytes Relative: 28 %
Lymphs Abs: 1176 cells/uL (ref 850–3900)
MCH: 30 pg (ref 27.0–33.0)
MCHC: 31.8 g/dL — ABNORMAL LOW (ref 32.0–36.0)
MCV: 94.3 fL (ref 80.0–100.0)
MONOS PCT: 10 %
MPV: 9.9 fL (ref 7.5–12.5)
Monocytes Absolute: 420 cells/uL (ref 200–950)
NEUTROS ABS: 2562 {cells}/uL (ref 1500–7800)
Neutrophils Relative %: 61 %
PLATELETS: 227 10*3/uL (ref 140–400)
RBC: 3.53 MIL/uL — AB (ref 3.80–5.10)
RDW: 15.3 % — AB (ref 11.0–15.0)
WBC: 4.2 10*3/uL (ref 3.8–10.8)

## 2017-03-08 LAB — COMPLETE METABOLIC PANEL WITH GFR
ALT: 12 U/L (ref 6–29)
AST: 28 U/L (ref 10–35)
Albumin: 4 g/dL (ref 3.6–5.1)
Alkaline Phosphatase: 57 U/L (ref 33–130)
BILIRUBIN TOTAL: 0.7 mg/dL (ref 0.2–1.2)
BUN: 17 mg/dL (ref 7–25)
CHLORIDE: 104 mmol/L (ref 98–110)
CO2: 22 mmol/L (ref 20–31)
CREATININE: 0.62 mg/dL (ref 0.60–0.93)
Calcium: 9.1 mg/dL (ref 8.6–10.4)
GFR, Est African American: >89 mL/min (ref >=60)
GFR, Est Non African American: 87 mL/min (ref >=60)
GLUCOSE: 74 mg/dL (ref 65–99)
Potassium: 4.2 mmol/L (ref 3.5–5.3)
SODIUM: 137 mmol/L (ref 135–146)
TOTAL PROTEIN: 7.2 g/dL (ref 6.1–8.1)

## 2017-03-08 MED ORDER — SULFASALAZINE 500 MG PO TABS: 500.0000 mg | ORAL_TABLET | Freq: Two times a day (BID) | ORAL | 1 refills | Status: DC

## 2017-03-08 MED ORDER — METHOTREXATE 2.5 MG PO TABS: 10.0000 mg | ORAL_TABLET | ORAL | 0 refills | Status: DC

## 2017-03-08 MED ORDER — FOLIC ACID 1 MG PO TABS: 1.0000 mg | ORAL_TABLET | Freq: Every day | ORAL | 4 refills | Status: DC

## 2017-04-27 ENCOUNTER — Other Ambulatory Visit: Payer: Self-pay | Admitting: Internal Medicine

## 2017-04-27 DIAGNOSIS — Z1231 Encounter for screening mammogram for malignant neoplasm of breast: Secondary | ICD-10-CM

## 2017-05-09 ENCOUNTER — Ambulatory Visit
Admission: RE | Admit: 2017-05-09 | Discharge: 2017-05-09 | Disposition: A | Discharge status: Home / Self Care | Payer: Medicare Other | Source: Ambulatory Visit | Attending: Internal Medicine | Admitting: Internal Medicine

## 2017-05-09 DIAGNOSIS — Z1231 Encounter for screening mammogram for malignant neoplasm of breast: Secondary | ICD-10-CM

## 2017-05-30 ENCOUNTER — Other Ambulatory Visit: Payer: Self-pay | Admitting: Rheumatology

## 2017-05-30 NOTE — Telephone Encounter (Signed)
Last Visit: 03/08/17 Next visit due in August 2018. Message sent to the front to schedule patient.  Labs: 03/08/17 anemia  Okay to refill per Dr. Corliss Skains

## 2017-06-06 ENCOUNTER — Ambulatory Visit: Payer: Medicare Other | Admitting: Rheumatology

## 2017-06-08 ENCOUNTER — Ambulatory Visit: Payer: Medicare Other | Admitting: Rheumatology

## 2017-06-09 DIAGNOSIS — Z87898 Personal history of other specified conditions: Secondary | ICD-10-CM | POA: Insufficient documentation

## 2017-06-09 DIAGNOSIS — Z789 Other specified health status: Secondary | ICD-10-CM | POA: Insufficient documentation

## 2017-06-09 NOTE — Progress Notes (Signed)
Office Visit Note  Patient: [name]             Date of Birth: 03-23-1941           MRN: [id number]             PCP: Georgann Housekeeper, MD Referring: Georgann Housekeeper, MD Visit Date: 06/16/2017 Occupation: @GUAROCC @    Subjective:  Pain of the Left Foot (Toes painful ) and Medication Management (unkown if taking meds patinet states she does not know meds (has spoken to interpreter))   History of Present Illness: [name] is a 76 y.o. female with history of rheumatoid arthritis and osteoarthritis. She states she is not having much discomfort in her joints except for the toes. She describes discomfort in her left second toe and right fourth toe. She denies any joint swelling. She states she's been tolerating her medications well without any side effects.  Activities of Daily Living:  Patient reports morning stiffness for 10 minutes.   Patient Denies nocturnal pain.  Difficulty dressing/grooming: Reports Difficulty climbing stairs: Reports Difficulty getting out of chair: Reports Difficulty using hands for taps, buttons, cutlery, and/or writing: Denies   Review of Systems  Constitutional: Positive for fatigue. Negative for night sweats, weight gain, weight loss and weakness.  HENT: Negative for mouth sores, trouble swallowing, trouble swallowing, mouth dryness and nose dryness.   Eyes: Negative for pain, redness, visual disturbance and dryness.  Respiratory: Negative for cough, shortness of breath and difficulty breathing.   Cardiovascular: Negative for chest pain, palpitations, hypertension, irregular heartbeat and swelling in legs/feet.  Gastrointestinal: Negative for blood in stool, constipation and diarrhea.  Endocrine: Negative for increased urination.  Genitourinary: Negative for vaginal dryness.  Musculoskeletal: Positive for arthralgias and joint pain. Negative for joint swelling, myalgias, muscle weakness, morning stiffness, muscle tenderness and myalgias.    Skin: Negative for color change, rash, hair loss, skin tightness, ulcers and sensitivity to sunlight.  Allergic/Immunologic: Negative for susceptible to infections.  Neurological: Negative for dizziness, memory loss and night sweats.  Hematological: Negative for swollen glands.  Psychiatric/Behavioral: Negative for depressed mood and sleep disturbance. The patient is not nervous/anxious.     PMFS History:  Patient Active Problem List   Diagnosis Date Noted  . Language barrier 06/09/2017  . History of Pulmonary nodulosis  06/09/2017  . 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 High risk medication use 03/07/2017  . History of gastroesophageal reflux (GERD) 03/07/2017  . Kyphosis 03/07/2017  . Idiopathic scoliosis 03/07/2017  . Age-related osteoporosis without current pathological fracture 03/07/2017  . 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 Pulmonary nodules 03/07/2017  . Dermatitis 09/09/2016  . 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 Arthritis    RA, sees Dr.  09/28/2015  . Cataracts, bilateral    hx of  . Chronic back pain   . GERD (gastroesophageal reflux disease)   . Headache(784.0)     No family history on file. Past Surgical History:  Procedure Laterality Date  . EYE SURGERY     cataract bilateral  . FINGER ARTHRODESIS  06/06/2012   Procedure: ARTHRODESIS FINGER;  Surgeon: 08/06/2012, MD;  Location: [facility] OR;  Service: Orthopedics;  Laterality: Right;  RIGHT SMALL FINGER AND RING FINGER PIP FUSION WITH AUTOLOGOUS GRAFT FROM RIGHT WRIST  . FINGER  ARTHROPLASTY Left 09/26/2015   Procedure: LEFT HAND METACARPOPHALANGEAL ARTHROPLASTY INDEX MIDDLE RING AND SMALL FINGERS WITH REALIGNMENT OF ;  Surgeon: Dominica Severin, MD;  Location: [facility] OR;  Service: Orthopedics;   Laterality: Left;  . FOOT SURGERY    . FRACTURE SURGERY     under right eye  . HAND SURGERY    . PROXIMAL INTERPHALANGEAL FUSION (PIP) Left 04/22/2016   Procedure: PROXIMAL INTERPHALANGEAL FUSION (PIP) Index, middle, and ring;  Surgeon: Dominica Severin, MD;  Location: [facility] OR;  Service: Orthopedics;  Laterality: Left;  . REPAIR EXTENSOR TENDON Left 09/26/2015   Procedure: EXTENSOR TENDON;  Surgeon: Dominica Severin, MD;  Location: [facility] OR;  Service: Orthopedics;  Laterality: Left;   Social History   Social History Narrative  . No narrative on file     Objective: Vital Signs: BP 118/68   Pulse 74   Ht 5' (1.524 m)   Wt 90 lb (40.8 kg)   BMI 17.58 kg/m    Physical Exam  Constitutional: She is oriented to person, place, and time. She appears well-developed and well-nourished.  HENT:  Head: Normocephalic and atraumatic.  Eyes: Conjunctivae and EOM are normal.  Neck: Normal range of motion.  Cardiovascular: Normal rate, regular rhythm, normal heart sounds and intact distal pulses.   Pulmonary/Chest: Effort normal and breath sounds normal.  Abdominal: Soft. Bowel sounds are normal.  Lymphadenopathy:    She has no cervical adenopathy.  Neurological: She is alert and oriented to person, place, and time.  Skin: Skin is warm and dry. Capillary refill takes less than 2 seconds.  Psychiatric: She has a normal mood and affect. Her behavior is normal.  Nursing note and vitals reviewed.    Musculoskeletal Exam: Patient has severe kyphosis and scoliosis . Shoulder joints elbow joints are good range of motion. She is incomplete fist formation due to severe rheumatoid arthritis although she does not have any active synovitis today. Hip joints and knee joints are good range of motion. She is some thickening of bilateral ankle joints and also some contractures and hurt MTPs and PIP joints in her feet. No active synovitis was noted.  CDAI Exam: CDAI Homunculus Exam:   Tenderness:  Left foot: 2nd  MTP  Joint Counts:  CDAI Tender Joint count: 0 CDAI Swollen Joint count: 0     Investigation: No additional findings. CBC Latest Ref Rng & Units 03/08/2017 10/14/2016 04/20/2016  WBC 3.8 - 10.8 K/uL 4.2 6.2 5.2  Hemoglobin 11.7 - 15.5 g/dL 10.6(L) 10.7(L) 11.2(L)  Hematocrit 35.0 - 45.0 % 33.3(L) 32.6(L) 34.9(L)  Platelets 140 - 400 K/uL 227 224 240   CMP Latest Ref Rng & Units 03/08/2017 10/14/2016 09/18/2015  Glucose 65 - 99 mg/dL 74 84 77  BUN 7 - 25 mg/dL 17 20 13   Creatinine 0.60 - 0.93 mg/dL 9.56 3.87 5.64  Sodium 135 - 146 mmol/L 137 138 138  Potassium 3.5 - 5.3 mmol/L 4.2 4.2 4.0  Chloride 98 - 110 mmol/L 104 104 103  CO2 20 - 31 mmol/L 22 23 25   Calcium 8.6 - 10.4 mg/dL 9.1 9.2 9.6  Total Protein 6.1 - 8.1 g/dL 7.2 7.3 -  Total Bilirubin 0.2 - 1.2 mg/dL 0.7 0.8 -  Alkaline Phos 33 - 130 U/L 57 47 -  AST 10 - 35 U/L 28 20 -  ALT 6 - 29 U/L 12 12 -   Imaging: No results found.  Speciality Comments: No specialty comments available.    Procedures:  No procedures performed  Allergies: Patient has no known allergies.   Assessment / Plan:     Visit Diagnoses: Seropositive rheumatoid arthritis of multiple sites [facility]): She has no active synovitis on examination today she has positive synovial thickening in multiple joints.  Rheumatoid arthritis involving both hands with positive rheumatoid factor (HCC) history of hand surgeries by DrGramig: She has done well after surgery. She does have decreased grip strength and incomplete fist formation.  High risk medication use - Methotrexate  4 tablets by mouth every week, folic acid 1mg  by mouth daily, SSZ  500 mg 1 tablet by outh twice a day - Plan: CBC with Differential/Platelet, COMPLETE METABOLIC PANEL WITH GFR today and every 3 months.  Age-related osteoporosis without current pathological fracture - DEXA 2012 -3.9 T score of the distal radius . I'm uncertain if she is taking any medications for osteoporosis. I've advised to  follow-up with Dr. 2013 and may repeat DEXA.  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 CBC with Differential/Platelet  . COMPLETE METABOLIC PANEL WITH GFR   No orders of the defined types were placed in this encounter.    Follow-Up Instructions: Return in about 5 months (around 11/16/2017) for Rheumatoid arthritis.   11/18/2017, MD  Note - This record has been created using Pollyann Savoy.  Chart creation errors have been sought, but may not always  have been located. Such creation errors do not reflect on  the standard of medical care.

## 2017-06-16 ENCOUNTER — Encounter: Payer: Self-pay | Admitting: Rheumatology

## 2017-06-16 ENCOUNTER — Ambulatory Visit (INDEPENDENT_AMBULATORY_CARE_PROVIDER_SITE_OTHER): Payer: Medicare Other | Admitting: Rheumatology

## 2017-06-16 VITALS — BP 118/68 | HR 74 | Ht 60.0 in | Wt 90.0 lb

## 2017-06-16 DIAGNOSIS — Z789 Other specified health status: Secondary | ICD-10-CM | POA: Diagnosis not present

## 2017-06-16 DIAGNOSIS — M05742 Rheumatoid arthritis with rheumatoid factor of left hand without organ or systems involvement: Secondary | ICD-10-CM

## 2017-06-16 DIAGNOSIS — R7612 Nonspecific reaction to cell mediated immunity measurement of gamma interferon antigen response without active tuberculosis: Secondary | ICD-10-CM | POA: Diagnosis not present

## 2017-06-16 DIAGNOSIS — M05741 Rheumatoid arthritis with rheumatoid factor of right hand without organ or systems involvement: Secondary | ICD-10-CM | POA: Diagnosis not present

## 2017-06-16 DIAGNOSIS — Z79899 Other long term (current) drug therapy: Secondary | ICD-10-CM

## 2017-06-16 DIAGNOSIS — M810 Age-related osteoporosis without current pathological fracture: Secondary | ICD-10-CM | POA: Diagnosis not present

## 2017-06-16 DIAGNOSIS — H35389 Toxic maculopathy, unspecified eye: Secondary | ICD-10-CM | POA: Diagnosis not present

## 2017-06-16 DIAGNOSIS — Z87898 Personal history of other specified conditions: Secondary | ICD-10-CM | POA: Diagnosis not present

## 2017-06-16 DIAGNOSIS — Z8739 Personal history of other diseases of the musculoskeletal system and connective tissue: Secondary | ICD-10-CM

## 2017-06-16 DIAGNOSIS — Z8719 Personal history of other diseases of the digestive system: Secondary | ICD-10-CM

## 2017-06-16 DIAGNOSIS — T372X5A Adverse effect of antimalarials and drugs acting on other blood protozoa, initial encounter: Secondary | ICD-10-CM

## 2017-06-16 DIAGNOSIS — M0579 Rheumatoid arthritis with rheumatoid factor of multiple sites without organ or systems involvement: Secondary | ICD-10-CM

## 2017-06-16 LAB — CBC WITH DIFFERENTIAL/PLATELET
BASOS ABS: 0 {cells}/uL (ref 0–200)
Basophils Relative: 0 %
EOS ABS: 78 {cells}/uL (ref 15–500)
EOS PCT: 2 %
HEMATOCRIT: 33.6 % — AB (ref 35.0–45.0)
HEMOGLOBIN: 10.9 g/dL — AB (ref 11.7–15.5)
Lymphocytes Relative: 35 %
Lymphs Abs: 1365 cells/uL (ref 850–3900)
MCH: 30.8 pg (ref 27.0–33.0)
MCHC: 32.4 g/dL (ref 32.0–36.0)
MCV: 94.9 fL (ref 80.0–100.0)
MONOS PCT: 13 %
MPV: 9.3 fL (ref 7.5–12.5)
Monocytes Absolute: 507 cells/uL (ref 200–950)
NEUTROS ABS: 1950 {cells}/uL (ref 1500–7800)
Neutrophils Relative %: 50 %
Platelets: 243 10*3/uL (ref 140–400)
RBC: 3.54 MIL/uL — ABNORMAL LOW (ref 3.80–5.10)
RDW: 15.9 % — ABNORMAL HIGH (ref 11.0–15.0)
WBC: 3.9 10*3/uL (ref 3.8–10.8)

## 2017-06-16 NOTE — Patient Instructions (Signed)
Standing Labs We placed an order today for your standing lab work.    Please come back and get your standing labs in November and every 3 months  We have open lab Monday through Friday from 8:30-11:30 AM and 1:30-4 PM at the office of Dr. Ivor Kishi.   The office is located at [address], [address], [place], [place] [address] No appointment is necessary.   Labs are drawn by [facility].  You may receive a bill from [facility] for your lab work. If you have any questions regarding directions or hours of operation,  please call [phone].    

## 2017-06-17 LAB — COMPLETE METABOLIC PANEL WITH GFR
ALBUMIN: 4.3 g/dL (ref 3.6–5.1)
ALK PHOS: 60 U/L (ref 33–130)
ALT: 12 U/L (ref 6–29)
AST: 25 U/L (ref 10–35)
BUN: 16 mg/dL (ref 7–25)
CALCIUM: 9.5 mg/dL (ref 8.6–10.4)
CO2: 23 mmol/L (ref 20–32)
CREATININE: 0.66 mg/dL (ref 0.60–0.93)
Chloride: 105 mmol/L (ref 98–110)
GFR, Est African American: >89 mL/min (ref >=60)
GFR, Est Non African American: 85 mL/min (ref >=60)
Glucose, Bld: 89 mg/dL (ref 65–99)
Potassium: 4.5 mmol/L (ref 3.5–5.3)
Sodium: 138 mmol/L (ref 135–146)
TOTAL PROTEIN: 7.2 g/dL (ref 6.1–8.1)
Total Bilirubin: 0.8 mg/dL (ref 0.2–1.2)

## 2017-06-17 NOTE — Progress Notes (Signed)
Labs are stable.

## 2017-06-21 ENCOUNTER — Other Ambulatory Visit: Payer: Self-pay | Admitting: *Deleted

## 2017-06-21 MED ORDER — DICLOFENAC SODIUM 1 % TD GEL: TRANSDERMAL | 2 refills | Status: DC

## 2017-06-21 NOTE — Telephone Encounter (Signed)
Last Visit: 06/16/17 Next Visit: 09/27/17  Okay to refill per Dr. Corliss Skains

## 2017-06-22 ENCOUNTER — Telehealth: Payer: Self-pay

## 2017-06-22 NOTE — Telephone Encounter (Signed)
A prior authorization for Voltaren Gel was submitted to patient's insurance via CoverMyMeds.  Receive a confirmation from CoverMyMeds stating that the patient has been approved through 10/31/2017.   Reference number: [id number] Phone: [phone]  Called patient to update her. Left message   Doyne Micke, Mayer Masker, CPhT 9:35 AM

## 2017-06-30 ENCOUNTER — Telehealth: Payer: Self-pay | Admitting: Rheumatology

## 2017-06-30 NOTE — Telephone Encounter (Signed)
Patient has received authorization for the Voltaren Gel ,and would like it called into [facility] [phone]

## 2017-07-01 NOTE — Telephone Encounter (Signed)
Left message to advise patient prescription has been sent to the pharmacy on 06/21/17 and she should contact the pharmacy as the should have it on file and should be able to fill it.

## 2017-07-20 ENCOUNTER — Other Ambulatory Visit: Payer: Self-pay | Admitting: Internal Medicine

## 2017-07-20 DIAGNOSIS — R911 Solitary pulmonary nodule: Secondary | ICD-10-CM

## 2017-07-25 ENCOUNTER — Other Ambulatory Visit: Payer: Medicare Other

## 2017-07-26 ENCOUNTER — Ambulatory Visit
Admission: RE | Admit: 2017-07-26 | Discharge: 2017-07-26 | Disposition: A | Discharge status: Home / Self Care | Payer: Medicare Other | Source: Ambulatory Visit | Attending: Internal Medicine | Admitting: Internal Medicine

## 2017-07-26 DIAGNOSIS — R911 Solitary pulmonary nodule: Secondary | ICD-10-CM

## 2017-07-26 MED ORDER — IOPAMIDOL (ISOVUE-300) INJECTION 61%
75.0000 mL | Freq: Once | INTRAVENOUS | Status: AC | PRN
  Administered 2017-07-26: 75 mL via INTRAVENOUS

## 2017-08-22 ENCOUNTER — Ambulatory Visit: Payer: Self-pay | Admitting: Orthopedic Surgery

## 2017-09-01 ENCOUNTER — Other Ambulatory Visit: Payer: Self-pay | Admitting: Rheumatology

## 2017-09-02 NOTE — Telephone Encounter (Signed)
Last Visit: 06/16/17 Next Visit: 09/27/17 Labs: 06/16/17 stable   Okay to refill per Dr. Corliss Skains

## 2017-09-02 NOTE — Telephone Encounter (Signed)
Last Visit: 06/16/17 Next Visit: 09/27/17 Labs: 06/16/17 stable   Okay to refill per Dr. Deveshwar 

## 2017-09-15 NOTE — Pre-Procedure Instructions (Addendum)
BEYA TIPPS  09/15/2017      [facility] - [place], [place] - [address] [address] Phone: [phone] Fax: [phone]    Your procedure is scheduled on Tuesday, September 20, 2017  Report to [facility] Admitting Entrance "A" at 1:30P.M.   Call this number if you have problems the morning of surgery:  [phone]   Remember:  Do not eat food or drink liquids after midnight.  Take these medicines the morning of surgery with A SIP OF WATER: Fluorometholone (FML) and CycloSPORINE (RESTASIS). If needed HYDROcodone-acetaminophen (NORCO/VICODIN) for pain and SYSTANE OP for dry eyes.  As of today. stop taking all Aspirins, Vitamins, Fish oils, and Herbal medications. Also stop all NSAIDS i.e. Advil, Ibuprofen, Motrin, Aleve, Anaprox, Naproxen, BC and Goody Powders.   Do not wear jewelry, make-up, or nail polish.  Do not wear lotions, powders, perfumes, or deodorant.  Do not shave 48 hours prior to surgery.    Do not bring valuables to the hospital.  [facility] is not responsible for any belongings or valuables.  Contacts, dentures or bridgework may not be worn into surgery.  Leave your suitcase in the car.  After surgery it may be brought to your room.  For patients admitted to the hospital, discharge time will be determined by your treatment team.  Patients discharged the day of surgery will not be allowed to drive home.   Special instructions:   [place]- Preparing For Surgery  Before surgery, you can play an important role. Because skin is not sterile, your skin needs to be as free of germs as possible. You can reduce the number of germs on your skin by washing with CHG (chlorahexidine gluconate) Soap before surgery.  CHG is an antiseptic cleaner which kills germs and bonds with the skin to continue killing germs even after washing.  Please do not use if you have an allergy to CHG or  antibacterial soaps. If your skin becomes reddened/irritated stop using the CHG.  Do not shave (including legs and underarms) for at least 48 hours prior to first CHG shower. It is OK to shave your face.  Please follow these instructions carefully.   1. Shower the NIGHT BEFORE SURGERY and the MORNING OF SURGERY with CHG.   2. If you chose to wash your hair, wash your hair first as usual with your normal shampoo.  3. After you shampoo, rinse your hair and body thoroughly to remove the shampoo.  4. Use CHG as you would any other liquid soap. You can apply CHG directly to the skin and wash gently with a scrungie or a clean washcloth.   5. Apply the CHG Soap to your body ONLY FROM THE NECK DOWN.  Do not use on open wounds or open sores. Avoid contact with your eyes, ears, mouth and genitals (private parts). Wash Face and genitals (private parts)  with your normal soap.  6. Wash thoroughly, paying special attention to the area where your surgery will be performed.  7. Thoroughly rinse your body with warm water from the neck down.  8. DO NOT shower/wash with your normal soap after using and rinsing off the CHG Soap.  9. Pat yourself dry with a CLEAN TOWEL.  10. Wear CLEAN PAJAMAS to bed the night before surgery, wear comfortable clothes the morning of surgery  11. Place CLEAN SHEETS on your bed the night of your first  shower and DO NOT SLEEP WITH PETS.  Day of Surgery: Do not apply any deodorants/lotions. Please wear clean clothes to the hospital/surgery center.    Please read over the following fact sheets that you were given. Pain Booklet, Coughing and Deep Breathing, MRSA Information and Surgical Site Infection Prevention

## 2017-09-16 ENCOUNTER — Other Ambulatory Visit: Payer: Self-pay

## 2017-09-16 ENCOUNTER — Encounter (HOSPITAL_COMMUNITY): Payer: Self-pay

## 2017-09-16 ENCOUNTER — Encounter (HOSPITAL_COMMUNITY)
Admission: RE | Admit: 2017-09-16 | Discharge: 2017-09-16 | Disposition: A | Discharge status: Home / Self Care | Payer: Medicare Other | Source: Ambulatory Visit | Attending: Orthopedic Surgery | Admitting: Orthopedic Surgery

## 2017-09-16 DIAGNOSIS — Z472 Encounter for removal of internal fixation device: Secondary | ICD-10-CM | POA: Diagnosis not present

## 2017-09-16 DIAGNOSIS — Z01818 Encounter for other preprocedural examination: Secondary | ICD-10-CM | POA: Diagnosis not present

## 2017-09-16 HISTORY — DX: Other complications of anesthesia, initial encounter: T88.59XA

## 2017-09-16 HISTORY — DX: Adverse effect of unspecified anesthetic, initial encounter: T41.45XA

## 2017-09-16 HISTORY — DX: Nausea with vomiting, unspecified: R11.2

## 2017-09-16 HISTORY — DX: Other specified postprocedural states: Z98.890

## 2017-09-16 LAB — BASIC METABOLIC PANEL
ANION GAP: 8 (ref 5–15)
BUN: 13 mg/dL (ref 6–20)
CALCIUM: 9.5 mg/dL (ref 8.9–10.3)
CO2: 24 mmol/L (ref 22–32)
Chloride: 103 mmol/L (ref 101–111)
Creatinine, Ser: 0.71 mg/dL (ref 0.44–1.00)
GFR calc Af Amer: >60 mL/min (ref >60)
GFR calc non Af Amer: >60 mL/min (ref >60)
GLUCOSE: 78 mg/dL (ref 65–99)
Potassium: 4.4 mmol/L (ref 3.5–5.1)
Sodium: 135 mmol/L (ref 135–145)

## 2017-09-16 LAB — CBC
HCT: 37.4 % (ref 36.0–46.0)
HEMOGLOBIN: 12.6 g/dL (ref 12.0–15.0)
MCH: 31.3 pg (ref 26.0–34.0)
MCHC: 33.7 g/dL (ref 30.0–36.0)
MCV: 93 fL (ref 78.0–100.0)
Platelets: 177 10*3/uL (ref 150–400)
RBC: 4.02 MIL/uL (ref 3.87–5.11)
RDW: 14.1 % (ref 11.5–15.5)
WBC: 4.2 10*3/uL (ref 4.0–10.5)

## 2017-09-16 LAB — SURGICAL PCR SCREEN
MRSA, PCR: NEGATIVE
STAPHYLOCOCCUS AUREUS: NEGATIVE

## 2017-09-16 NOTE — Progress Notes (Addendum)
PCP - Dr. Georgann Housekeeper  Cardiologist - Denies  Chest x-ray - Denies  EKG - Denies  Stress Test - Denies  ECHO - Denies  Cardiac Cath - Denies  Sleep Study - No CPAP - None    Pt denies having chest pain, sob, or fever at this time. All instructions explained to the pt, with a verbal understanding to arrive at 1330 on Tuesday for her surgery. Pt sts she will arrive via a transportation service. Pt sts her son will be there to help her when she goes home. Pt was offered to have an interpreter, but she sts does not need an interpreter the DOS. Pt informed me that her interpreter is out of town until 11/22. Message left for her son Amelda Hapke (cell: [phone]) to clarify if he would be here the DOS, or if he wanted an interpreter provided for the pt. Pt was able to answer questions and sign the consent for the procedure. Pt agrees to go over the instructions while at home for a better understanding. The opportunity to ask questions was provided.

## 2017-09-16 NOTE — Progress Notes (Signed)
Mr. [name], son of pt returned the call to inform me that she will need an interpreter the DOS. He was not aware of her interpreter being out of town until 11/22.

## 2017-09-16 NOTE — Progress Notes (Signed)
Interpreter requested via email and cc to [facility]. There will be a Hindi interpreter for her on Tuesday.

## 2017-09-17 NOTE — Progress Notes (Signed)
Office Visit Note  Patient: [name]             Date of Birth: 17-Feb-1941           MRN: [id number]             PCP: Georgann Housekeeper, MD Referring: Georgann Housekeeper, MD Visit Date: 09/27/2017 Occupation: @GUAROCC @    Subjective:  Pain in right shoulder and left hand.   History of Present Illness: [name] is a 76 y.o. female with history of rheumatoid arthritis and osteoporosis. She states she had right hand surgery to repair right fourth and fifth tendon of her hand by Dr. Amanda Pea on November 20. She's been having some discomfort in her right shoulder and her left hand. She states she continues to take her medicines.  Activities of Daily Living:  Patient reports morning stiffness for 30 minutes.   Patient Reports nocturnal pain.  Difficulty dressing/grooming: Denies Difficulty climbing stairs: Reports Difficulty getting out of chair: Reports Difficulty using hands for taps, buttons, cutlery, and/or writing: Reports   Review of Systems  Constitutional: Positive for fatigue. Negative for night sweats, weight gain, weight loss and weakness.  HENT: Negative for mouth sores, trouble swallowing, trouble swallowing, mouth dryness and nose dryness.   Eyes: Positive for dryness. Negative for pain, redness and visual disturbance.  Respiratory: Negative for cough, shortness of breath and difficulty breathing.   Cardiovascular: Negative for chest pain, palpitations, hypertension, irregular heartbeat and swelling in legs/feet.  Gastrointestinal: Negative for blood in stool, constipation and diarrhea.  Endocrine: Negative for increased urination.  Genitourinary: Negative for vaginal dryness.  Musculoskeletal: Positive for arthralgias, joint pain, joint swelling and morning stiffness. Negative for myalgias, muscle weakness, muscle tenderness and myalgias.  Skin: Negative for color change, rash, hair loss, skin tightness, ulcers and sensitivity to sunlight.    Allergic/Immunologic: Negative for susceptible to infections.  Neurological: Negative for dizziness, memory loss and night sweats.  Hematological: Negative for swollen glands.  Psychiatric/Behavioral: Negative for depressed mood and sleep disturbance. The patient is not nervous/anxious.     PMFS History:  Patient Active Problem List   Diagnosis Date Noted  . Rheumatoid arthritis (HCC) 09/20/2017  . Language barrier 06/09/2017  . History of Pulmonary nodulosis  06/09/2017  . 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 High risk medication use 03/07/2017  . History of gastroesophageal reflux (GERD) 03/07/2017  . Kyphosis 03/07/2017  . Idiopathic scoliosis 03/07/2017  . Age-related osteoporosis without current pathological fracture 03/07/2017  . 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 Pulmonary nodules 03/07/2017  . Dermatitis 09/09/2016  . 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 Arthritis    RA, sees Dr.  Ethelene Hal  . Cataracts, bilateral    hx of  . Chronic back pain   . Complication of anesthesia   . GERD (gastroesophageal reflux disease)   . Headache(784.0)   . PONV (postoperative nausea and vomiting)     History reviewed. No pertinent family history. Past Surgical History:  Procedure Laterality Date  . EYE SURGERY     cataract bilateral  . FINGER ARTHRODESIS  06/06/2012   Procedure: ARTHRODESIS FINGER;  Surgeon: Dominica Severin, MD;  Location: [facility] OR;  Service: Orthopedics;  Laterality: Right;  RIGHT SMALL FINGER AND RING FINGER PIP FUSION WITH AUTOLOGOUS GRAFT FROM RIGHT  WRIST  . FINGER ARTHROPLASTY Left 09/26/2015   Procedure: LEFT HAND METACARPOPHALANGEAL ARTHROPLASTY INDEX MIDDLE RING AND SMALL FINGERS WITH REALIGNMENT OF ;  Surgeon: Dominica Severin, MD;  Location: [facility]  OR;  Service: Orthopedics;  Laterality: Left;  . FINGER SURGERY Right 09/20/2017   screws in 4th and 5th digit  . FOOT SURGERY    . FRACTURE SURGERY     under right eye  . HAND SURGERY    . HARDWARE REMOVAL Right 09/20/2017   Procedure: Right hand hardware removal  ring and small fingers;  Surgeon: Dominica Severin, MD;  Location: [facility] OR;  Service: Orthopedics;  Laterality: Right;  60 mins  . PROXIMAL INTERPHALANGEAL FUSION (PIP) Left 04/22/2016   Procedure: PROXIMAL INTERPHALANGEAL FUSION (PIP) Index, middle, and ring;  Surgeon: Dominica Severin, MD;  Location: [facility] OR;  Service: Orthopedics;  Laterality: Left;  . REPAIR EXTENSOR TENDON Left 09/26/2015   Procedure: EXTENSOR TENDON;  Surgeon: Dominica Severin, MD;  Location: [facility] OR;  Service: Orthopedics;  Laterality: Left;   Social History   Social History Narrative  . Not on file     Objective: Vital Signs: BP 135/86 (BP Location: Left Arm, Patient Position: Sitting, Cuff Size: Normal)   Pulse 92   Resp 14   Ht 4\' 2"  (1.27 m) Comment: per patient  Wt 87 lb (39.5 kg)   BMI 24.47 kg/m    Physical Exam  Constitutional: She is oriented to person, place, and time. She appears well-developed and well-nourished.  HENT:  Head: Normocephalic and atraumatic.  Eyes: Conjunctivae and EOM are normal.  Neck: Normal range of motion.  Cardiovascular: Normal rate, regular rhythm, normal heart sounds and intact distal pulses.  Pulmonary/Chest: Effort normal and breath sounds normal.  Abdominal: Soft. Bowel sounds are normal.  Lymphadenopathy:    She has no cervical adenopathy.  Neurological: She is alert and oriented to person, place, and time.  Skin: Skin is warm and dry. Capillary refill takes less than 2 seconds.  Psychiatric: She has a normal mood and affect. Her behavior is normal.  Nursing note and vitals reviewed.    Musculoskeletal Exam: Discomfort with C-spine ROM.  Severe kyphosis and scoliosis.   Left shoulder abduction 50 degrees and  right shoulder 20 degrees of abduction. Right hand is bandaged so it cannot be examined. Post-surgical change of left MCP with thickening  Knees and ankles good ROM with no synovitis.  Hips were unable to be examined in the sitting position   CDAI Exam: CDAI Homunculus Exam:   Joint Counts:  CDAI Tender Joint count: 0 CDAI Swollen Joint count: 0  Global Assessments:  Patient Global Assessment: 5 Provider Global Assessment: 5  CDAI Calculated Score: 10    Investigation: No additional findings.chest xray: 07/26/2017 CBC Latest Ref Rng & Units 09/16/2017 06/16/2017 03/08/2017  WBC 4.0 - 10.5 K/uL 4.2 3.9 4.2  Hemoglobin 12.0 - 15.0 g/dL 05/08/2017 10.9(L) 10.6(L)  Hematocrit 36.0 - 46.0 % 37.4 33.6(L) 33.3(L)  Platelets 150 - 400 K/uL 177 243 227   CMP Latest Ref Rng & Units 09/16/2017 06/16/2017 03/08/2017  Glucose 65 - 99 mg/dL 78 89 74  BUN 6 - 20 mg/dL 13 16 17   Creatinine 0.44 - 1.00 mg/dL 05/08/2017 9.37  Sodium 135 - 145 mmol/L 135 138 137  Potassium 3.5 - 5.1 mmol/L 4.4 4.5 4.2  Chloride 101 - 111 mmol/L 103 105 104  CO2 22 - 32 mmol/L 24 23 22   Calcium 8.9 - 10.3 mg/dL 9.5 9.5 9.1  Total Protein 6.1 - 8.1 g/dL - 7.2 7.2  Total Bilirubin 0.2 - 1.2 mg/dL - 0.8 0.7  Alkaline Phos 33 - 130 U/L - 60 57  AST 10 - 35 U/L - 25 28  ALT 6 - 29 U/L - 12 12    Imaging: No results found.  Speciality Comments: No specialty comments available.    Procedures:  No procedures performed Allergies: Patient has no known allergies.   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 Plan: diclofenac sodium (VOLTAREN) 1 % GEL refill was given.  Rheumatoid arthritis involving both hands with positive rheumatoid factor (HCC) - history of hand surgeries by DrGramig: She has done well after surgery. Her right hand is in a bandage.. She  does have decreased grip strength and incomplete fist formation in her left hand.  High risk medication use - Methotrexate  4 tablets by mouth every week, folic acid 1mg  by mouth daily, SSZ  500 mg 1 tablet by outh twice a day - Plan: Hepatic function panel. CBC and BMP was normal in November.  Age-related osteoporosis without current pathological fracture - DEXA 2012 -3.9 T score of the distal radius . She supposed to be taking Fosamax. And is followed by Dr. 2013.  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 Hepatic function panel   Meds ordered this encounter  Medications  . diclofenac sodium (VOLTAREN) 1 % GEL    Sig: APPLY THREE GRAMS TO THREE LARGE JOINTS UP TO THREE TIMES A DAY    Dispense:  500 g    Refill:  2    Face-to-face time spent with patient was 30 minutes. Greater than 50% of time was spent in counseling and coordination of care.  Follow-Up Instructions: Return in about 5 months (around 02/25/2018) for Rheumatoid arthritis.   02/27/2018, MD  Note - This record has been created using Pollyann Savoy.  Chart creation errors have been sought, but may not always  have been located. Such creation errors do not reflect on  the standard of medical care.

## 2017-09-20 ENCOUNTER — Inpatient Hospital Stay (HOSPITAL_COMMUNITY): Payer: Medicare Other | Admitting: Anesthesiology

## 2017-09-20 ENCOUNTER — Encounter (HOSPITAL_COMMUNITY): Payer: Self-pay | Admitting: *Deleted

## 2017-09-20 ENCOUNTER — Inpatient Hospital Stay (HOSPITAL_COMMUNITY)
Admission: RE | Admit: 2017-09-20 | Discharge: 2017-09-23 | DRG: 497 | Disposition: A | Discharge status: Home / Self Care | Payer: Medicare Other | Source: Ambulatory Visit | Attending: Orthopedic Surgery | Admitting: Orthopedic Surgery

## 2017-09-20 ENCOUNTER — Inpatient Hospital Stay (HOSPITAL_COMMUNITY): Payer: Medicare Other | Admitting: Vascular Surgery

## 2017-09-20 ENCOUNTER — Encounter (HOSPITAL_COMMUNITY)
Admission: RE | Disposition: A | Discharge status: Home / Self Care | Payer: Self-pay | Source: Ambulatory Visit | Attending: Orthopedic Surgery

## 2017-09-20 DIAGNOSIS — K219 Gastro-esophageal reflux disease without esophagitis: Secondary | ICD-10-CM | POA: Diagnosis present

## 2017-09-20 DIAGNOSIS — G8929 Other chronic pain: Secondary | ICD-10-CM | POA: Diagnosis present

## 2017-09-20 DIAGNOSIS — Z79899 Other long term (current) drug therapy: Secondary | ICD-10-CM

## 2017-09-20 DIAGNOSIS — T8484XA Pain due to internal orthopedic prosthetic devices, implants and grafts, initial encounter: Principal | ICD-10-CM | POA: Diagnosis present

## 2017-09-20 DIAGNOSIS — Z96692 Finger-joint replacement of left hand: Secondary | ICD-10-CM | POA: Diagnosis present

## 2017-09-20 DIAGNOSIS — Z7983 Long term (current) use of bisphosphonates: Secondary | ICD-10-CM

## 2017-09-20 DIAGNOSIS — M069 Rheumatoid arthritis, unspecified: Secondary | ICD-10-CM | POA: Diagnosis present

## 2017-09-20 HISTORY — PX: HARDWARE REMOVAL: SHX979

## 2017-09-20 HISTORY — PX: FINGER SURGERY: SHX640

## 2017-09-20 SURGERY — REMOVAL, HARDWARE
Anesthesia: Monitor Anesthesia Care | Site: Finger | Laterality: Right

## 2017-09-20 MED ORDER — BUPIVACAINE-EPINEPHRINE 0.25% -1:200000 IJ SOLN
INTRAMUSCULAR | Status: AC
  Filled 2017-09-20: qty 1

## 2017-09-20 MED ORDER — PROPOFOL 500 MG/50ML IV EMUL
INTRAVENOUS | Status: DC | PRN
  Administered 2017-09-20: 50 ug/kg/min via INTRAVENOUS

## 2017-09-20 MED ORDER — CEFAZOLIN SODIUM-DEXTROSE 2-4 GM/100ML-% IV SOLN
2.0000 g | INTRAVENOUS | Status: AC
  Administered 2017-09-20: 2 g via INTRAVENOUS

## 2017-09-20 MED ORDER — MIDAZOLAM HCL 2 MG/2ML IJ SOLN
INTRAMUSCULAR | Status: AC
  Administered 2017-09-20: 1 mg via INTRAVENOUS
  Filled 2017-09-20: qty 2

## 2017-09-20 MED ORDER — PROPOFOL 10 MG/ML IV BOLUS
INTRAVENOUS | Status: AC
  Filled 2017-09-20: qty 20

## 2017-09-20 MED ORDER — CYCLOSPORINE 0.05 % OP EMUL
1.0000 [drp] | Freq: Two times a day (BID) | OPHTHALMIC | Status: DC
  Administered 2017-09-20 – 2017-09-23 (×6): 1 [drp] via OPHTHALMIC
  Filled 2017-09-20 (×7): qty 1

## 2017-09-20 MED ORDER — FENTANYL CITRATE (PF) 250 MCG/5ML IJ SOLN
INTRAMUSCULAR | Status: AC
  Filled 2017-09-20: qty 5

## 2017-09-20 MED ORDER — FENTANYL CITRATE (PF) 100 MCG/2ML IJ SOLN
INTRAMUSCULAR | Status: AC
  Administered 2017-09-20: 75 ug via INTRAVENOUS
  Filled 2017-09-20: qty 2

## 2017-09-20 MED ORDER — FENTANYL CITRATE (PF) 100 MCG/2ML IJ SOLN
75.0000 ug | Freq: Once | INTRAMUSCULAR | Status: AC
Start: 2017-09-20 — End: 2017-09-20
  Administered 2017-09-20: 75 ug via INTRAVENOUS

## 2017-09-20 MED ORDER — ONDANSETRON HCL 4 MG/2ML IJ SOLN: 4.0000 mg | Freq: Four times a day (QID) | INTRAMUSCULAR | Status: DC | PRN

## 2017-09-20 MED ORDER — PROPOFOL 500 MG/50ML IV EMUL: INTRAVENOUS | Status: DC | PRN

## 2017-09-20 MED ORDER — VITAMIN C 500 MG PO TABS
1000.0000 mg | ORAL_TABLET | Freq: Every day | ORAL | Status: DC
  Administered 2017-09-20 – 2017-09-22 (×3): 1000 mg via ORAL
  Filled 2017-09-20 (×3): qty 2

## 2017-09-20 MED ORDER — CEFAZOLIN SODIUM-DEXTROSE 1-4 GM/50ML-% IV SOLN
1.0000 g | Freq: Three times a day (TID) | INTRAVENOUS | Status: DC
  Administered 2017-09-21 – 2017-09-23 (×7): 1 g via INTRAVENOUS
  Filled 2017-09-20 (×9): qty 50

## 2017-09-20 MED ORDER — MORPHINE SULFATE (PF) 4 MG/ML IV SOLN
1.0000 mg | INTRAVENOUS | Status: DC | PRN
  Filled 2017-09-20: qty 1

## 2017-09-20 MED ORDER — CEFAZOLIN SODIUM-DEXTROSE 2-4 GM/100ML-% IV SOLN
INTRAVENOUS | Status: AC
  Filled 2017-09-20: qty 100

## 2017-09-20 MED ORDER — ONDANSETRON HCL 4 MG PO TABS: 4.0000 mg | ORAL_TABLET | Freq: Four times a day (QID) | ORAL | Status: DC | PRN

## 2017-09-20 MED ORDER — DOCUSATE SODIUM 100 MG PO CAPS
100.0000 mg | ORAL_CAPSULE | Freq: Two times a day (BID) | ORAL | Status: DC
  Administered 2017-09-20 – 2017-09-23 (×6): 100 mg via ORAL
  Filled 2017-09-20 (×6): qty 1

## 2017-09-20 MED ORDER — MIDAZOLAM HCL 2 MG/2ML IJ SOLN
INTRAMUSCULAR | Status: AC
  Filled 2017-09-20: qty 2

## 2017-09-20 MED ORDER — HYDROCODONE-ACETAMINOPHEN 5-325 MG PO TABS
1.0000 | ORAL_TABLET | ORAL | Status: DC | PRN
  Administered 2017-09-21 – 2017-09-23 (×4): 1 via ORAL
  Filled 2017-09-20 (×4): qty 1

## 2017-09-20 MED ORDER — LACTATED RINGERS IV SOLN
INTRAVENOUS | Status: DC | PRN
  Administered 2017-09-20: 16:00:00 via INTRAVENOUS

## 2017-09-20 MED ORDER — 0.9 % SODIUM CHLORIDE (POUR BTL) OPTIME
TOPICAL | Status: DC | PRN
  Administered 2017-09-20: 1000 mL

## 2017-09-20 MED ORDER — CEFAZOLIN SODIUM-DEXTROSE 1-4 GM/50ML-% IV SOLN
1.0000 g | INTRAVENOUS | Status: AC
  Administered 2017-09-20: 1 g via INTRAVENOUS
  Filled 2017-09-20: qty 50

## 2017-09-20 MED ORDER — METHOTREXATE 2.5 MG PO TABS: 10.0000 mg | ORAL_TABLET | ORAL | Status: DC

## 2017-09-20 MED ORDER — LACTATED RINGERS IV SOLN
INTRAVENOUS | Status: DC
  Administered 2017-09-20 – 2017-09-21 (×2): via INTRAVENOUS

## 2017-09-20 MED ORDER — FENTANYL CITRATE (PF) 100 MCG/2ML IJ SOLN: 25.0000 ug | INTRAMUSCULAR | Status: DC | PRN

## 2017-09-20 MED ORDER — SULFASALAZINE 500 MG PO TABS
500.0000 mg | ORAL_TABLET | Freq: Two times a day (BID) | ORAL | Status: DC
  Administered 2017-09-20 – 2017-09-23 (×6): 500 mg via ORAL
  Filled 2017-09-20 (×7): qty 1

## 2017-09-20 MED ORDER — MIDAZOLAM HCL 2 MG/2ML IJ SOLN
1.0000 mg | Freq: Once | INTRAMUSCULAR | Status: AC
  Administered 2017-09-20: 1 mg via INTRAVENOUS

## 2017-09-20 MED ORDER — CHLORHEXIDINE GLUCONATE 4 % EX LIQD: 60.0000 mL | Freq: Once | CUTANEOUS | Status: DC

## 2017-09-20 MED ORDER — FAMOTIDINE 20 MG PO TABS: 20.0000 mg | ORAL_TABLET | Freq: Two times a day (BID) | ORAL | Status: DC | PRN

## 2017-09-20 MED ORDER — LACTATED RINGERS IV SOLN
INTRAVENOUS | Status: DC
  Administered 2017-09-20: 14:00:00 via INTRAVENOUS

## 2017-09-20 MED ORDER — ROPIVACAINE HCL 5 MG/ML IJ SOLN
INTRAMUSCULAR | Status: DC | PRN
  Administered 2017-09-20: 125 mg via PERINEURAL

## 2017-09-20 SURGICAL SUPPLY — 57 items
BANDAGE ACE 3X5.8 VEL STRL LF (GAUZE/BANDAGES/DRESSINGS) IMPLANT
BANDAGE ACE 4X5 VEL STRL LF (GAUZE/BANDAGES/DRESSINGS) IMPLANT
BNDG COHESIVE 1X5 TAN STRL LF (GAUZE/BANDAGES/DRESSINGS) ×3 IMPLANT
BNDG CONFORM 2 STRL LF (GAUZE/BANDAGES/DRESSINGS) ×3 IMPLANT
BNDG GAUZE ELAST 4 BULKY (GAUZE/BANDAGES/DRESSINGS) IMPLANT
CAP PIN ORTHO PINK (CAP) IMPLANT
CAP PIN PROTECTOR ORTHO WHT (CAP) IMPLANT
CORDS BIPOLAR (ELECTRODE) ×3 IMPLANT
COVER SURGICAL LIGHT HANDLE (MISCELLANEOUS) ×3 IMPLANT
CUFF TOURN SGL LL 12 NO SLV (MISCELLANEOUS) ×2 IMPLANT
CUFF TOURNIQUET SINGLE 18IN (TOURNIQUET CUFF) IMPLANT
CUFF TOURNIQUET SINGLE 24IN (TOURNIQUET CUFF) IMPLANT
DRAPE OEC MINIVIEW 54X84 (DRAPES) ×3 IMPLANT
DRAPE SURG 17X23 STRL (DRAPES) ×3 IMPLANT
DRSG EMULSION OIL 3X3 NADH (GAUZE/BANDAGES/DRESSINGS) ×3 IMPLANT
GAUZE SPONGE 2X2 8PLY STRL LF (GAUZE/BANDAGES/DRESSINGS) IMPLANT
GAUZE SPONGE 4X4 12PLY STRL (GAUZE/BANDAGES/DRESSINGS) ×3 IMPLANT
GAUZE XEROFORM 1X8 LF (GAUZE/BANDAGES/DRESSINGS) ×3 IMPLANT
GLOVE BIOGEL M 8.0 STRL (GLOVE) ×3 IMPLANT
GLOVE BIOGEL PI IND STRL 6.5 (GLOVE) ×1 IMPLANT
GLOVE BIOGEL PI INDICATOR 6.5 (GLOVE) ×2
GLOVE SS BIOGEL STRL SZ 8 (GLOVE) ×1 IMPLANT
GLOVE SUPERSENSE BIOGEL SZ 8 (GLOVE) ×2
GLOVE SURG SS PI 6.5 STRL IVOR (GLOVE) ×6 IMPLANT
GLOVE SURG SS PI 7.0 STRL IVOR (GLOVE) ×3 IMPLANT
GOWN STRL REUS W/ TWL LRG LVL3 (GOWN DISPOSABLE) ×2 IMPLANT
GOWN STRL REUS W/ TWL XL LVL3 (GOWN DISPOSABLE) ×3 IMPLANT
GOWN STRL REUS W/TWL LRG LVL3 (GOWN DISPOSABLE) ×6
GOWN STRL REUS W/TWL XL LVL3 (GOWN DISPOSABLE) ×9
K-WIRE SMTH SNGL TROCAR .028X4 (WIRE)
KIT BASIN OR (CUSTOM PROCEDURE TRAY) ×3 IMPLANT
KIT ROOM TURNOVER OR (KITS) ×3 IMPLANT
KWIRE SMTH SNGL TROCAR .028X4 (WIRE) IMPLANT
MANIFOLD NEPTUNE II (INSTRUMENTS) ×1 IMPLANT
NEEDLE HYPO 25GX1X1/2 BEV (NEEDLE) IMPLANT
NS IRRIG 1000ML POUR BTL (IV SOLUTION) ×3 IMPLANT
PACK ORTHO EXTREMITY (CUSTOM PROCEDURE TRAY) ×3 IMPLANT
PAD ARMBOARD 7.5X6 YLW CONV (MISCELLANEOUS) ×3 IMPLANT
PAD CAST 4YDX4 CTTN HI CHSV (CAST SUPPLIES) IMPLANT
PADDING CAST COTTON 4X4 STRL (CAST SUPPLIES)
SCRUB BETADINE 4OZ XXX (MISCELLANEOUS) ×3 IMPLANT
SOL PREP POV-IOD 4OZ 10% (MISCELLANEOUS) ×3 IMPLANT
SPONGE GAUZE 2X2 STER 10/PKG (GAUZE/BANDAGES/DRESSINGS)
SUCTION FRAZIER HANDLE 10FR (MISCELLANEOUS)
SUCTION TUBE FRAZIER 10FR DISP (MISCELLANEOUS) IMPLANT
SUT MERSILENE 4 0 P 3 (SUTURE) IMPLANT
SUT PROLENE 4 0 PS 2 18 (SUTURE) IMPLANT
SUT PROLENE 5 0 PS 2 (SUTURE) ×2 IMPLANT
SUT VIC AB 2-0 CT1 27 (SUTURE)
SUT VIC AB 2-0 CT1 TAPERPNT 27 (SUTURE) IMPLANT
SYR CONTROL 10ML LL (SYRINGE) IMPLANT
TOWEL OR 17X24 6PK STRL BLUE (TOWEL DISPOSABLE) ×3 IMPLANT
TOWEL OR 17X26 10 PK STRL BLUE (TOWEL DISPOSABLE) ×3 IMPLANT
TUBE CONNECTING 12'X1/4 (SUCTIONS)
TUBE CONNECTING 12X1/4 (SUCTIONS) IMPLANT
UNDERPAD 30X30 (UNDERPADS AND DIAPERS) ×3 IMPLANT
WATER STERILE IRR 1000ML POUR (IV SOLUTION) ×1 IMPLANT

## 2017-09-20 NOTE — Anesthesia Procedure Notes (Signed)
Anesthesia Regional Block: Supraclavicular block   Pre-Anesthetic Checklist: ,, timeout performed, Correct Patient, Correct Site, Correct Laterality, Correct Procedure, Correct Position, site marked, Risks and benefits discussed,  Surgical consent,  Pre-op evaluation,  At surgeon's request and post-op pain management  Laterality: Right  Prep: chloraprep       Needles:  Injection technique: Single-shot  Needle Type: Echogenic Stimulator Needle     Needle Length: 5cm  Needle Gauge: 22     Additional Needles:   Narrative:  Start time: 09/20/2017 4:22 PM End time: 09/20/2017 4:32 PM Injection made incrementally with aspirations every 5 mL.  Performed by: Personally  Anesthesiologist: Heather Roberts, MD  Additional Notes: Functioning IV was confirmed and monitors applied.  A 86mm 22ga echogenic arrow stimulator was used. Sterile prep and drape,hand hygiene and sterile gloves were used.Ultrasound guidance: relevant anatomy identified, needle position confirmed, local anesthetic spread visualized around nerve(s)., vascular puncture avoided.  Image printed for medical record.  Negative aspiration and negative test dose prior to incremental administration of local anesthetic. The patient tolerated the procedure well.

## 2017-09-20 NOTE — Op Note (Signed)
See [phone] Status post hardware removal ring and small finger with tendon tenolysis  we'll plan for IV antibiotics and admission  Rondia Higginbotham M.D.

## 2017-09-20 NOTE — Anesthesia Preprocedure Evaluation (Signed)
Anesthesia Evaluation  Patient identified by MRN, date of birth, ID band Patient awake    Reviewed: Allergy & Precautions, NPO status , Patient's Chart, lab work & pertinent test results  History of Anesthesia Complications (+) PONV and history of anesthetic complications  Airway Mallampati: I  TM Distance: >3 FB Neck ROM: Full    Dental  (+) Edentulous Upper, Dental Advisory Given   Pulmonary neg pulmonary ROS,    breath sounds clear to auscultation       Cardiovascular negative cardio ROS   Rhythm:Regular Rate:Normal     Neuro/Psych negative neurological ROS     GI/Hepatic Neg liver ROS, GERD  Controlled,  Endo/Other  negative endocrine ROS  Renal/GU negative Renal ROS     Musculoskeletal  (+) Arthritis , Rheumatoid disorders,    Abdominal   Peds  Hematology   Anesthesia Other Findings   Reproductive/Obstetrics                             Anesthesia Physical  Anesthesia Plan  ASA: III  Anesthesia Plan: MAC   Post-op Pain Management: GA combined w/ Regional for post-op pain   Induction: Intravenous  PONV Risk Score and Plan: 2 and Ondansetron and Propofol infusion  Airway Management Planned: Natural Airway  Additional Equipment:   Intra-op Plan:   Post-operative Plan:   Informed Consent: I have reviewed the patients History and Physical, chart, labs and discussed the procedure including the risks, benefits and alternatives for the proposed anesthesia with the patient or authorized representative who has indicated his/her understanding and acceptance.   Dental advisory given  Plan Discussed with: CRNA and Surgeon  Anesthesia Plan Comments:         Anesthesia Quick Evaluation

## 2017-09-20 NOTE — Transfer of Care (Signed)
Immediate Anesthesia Transfer of Care Note  Patient: [name]  Procedure(s) Performed: Right hand hardware removal  ring and small fingers (Right Finger)  Patient Location: PACU  Anesthesia Type:Regional  Level of Consciousness: awake and alert   Airway & Oxygen Therapy: Patient Spontanous Breathing and Patient connected to nasal cannula oxygen  Post-op Assessment: Report given to RN and Post -op Vital signs reviewed and stable  Post vital signs: Reviewed and stable  Last Vitals:  Vitals:   09/20/17 1635 09/20/17 1812  BP: (!) 152/89 124/88  Pulse: 83 77  Resp: 20 (!) 21  Temp:  36.7 C  SpO2: 100% 100%    Last Pain:  Vitals:   09/20/17 1635  TempSrc:   PainSc: 2       Patients Stated Pain Goal: 3 (09/20/17 1348)  Complications: No apparent anesthesia complications

## 2017-09-20 NOTE — H&P (Signed)
[name] is an 76 y.o. female.   Chief Complaint: retained hardware right ring and small finger HPI: Patient presents for evaluation and treatment of the of their upper extremity predicament. The patient denies neck, back, chest or  abdominal pain. The patient notes that they have no lower extremity problems. The patients primary complaint is noted. We are planning surgical care pathway for the upper extremity.  Past Medical History:  Diagnosis Date  . Arthritis    RA, sees Dr.  Ethelene Hal  . Cataracts, bilateral    hx of  . Chronic back pain   . Complication of anesthesia   . GERD (gastroesophageal reflux disease)   . Headache(784.0)   . PONV (postoperative nausea and vomiting)     Past Surgical History:  Procedure Laterality Date  . EYE SURGERY     cataract bilateral  . FINGER ARTHRODESIS  06/06/2012   Procedure: ARTHRODESIS FINGER;  Surgeon: Dominica Severin, MD;  Location: [facility] OR;  Service: Orthopedics;  Laterality: Right;  RIGHT SMALL FINGER AND RING FINGER PIP FUSION WITH AUTOLOGOUS GRAFT FROM RIGHT WRIST  . FINGER ARTHROPLASTY Left 09/26/2015   Procedure: LEFT HAND METACARPOPHALANGEAL ARTHROPLASTY INDEX MIDDLE RING AND SMALL FINGERS WITH REALIGNMENT OF ;  Surgeon: Dominica Severin, MD;  Location: [facility] OR;  Service: Orthopedics;  Laterality: Left;  . FOOT SURGERY    . FRACTURE SURGERY     under right eye  . HAND SURGERY    . PROXIMAL INTERPHALANGEAL FUSION (PIP) Left 04/22/2016   Procedure: PROXIMAL INTERPHALANGEAL FUSION (PIP) Index, middle, and ring;  Surgeon: Dominica Severin, MD;  Location: [facility] OR;  Service: Orthopedics;  Laterality: Left;  . REPAIR EXTENSOR TENDON Left 09/26/2015   Procedure: EXTENSOR TENDON;  Surgeon: Dominica Severin, MD;  Location: [facility] OR;  Service: Orthopedics;  Laterality: Left;    History reviewed. No pertinent family history. Social History:  reports that  has never smoked. she has never used smokeless tobacco. She reports that she does not drink alcohol or  use drugs.  Allergies: No Known Allergies  Medications Prior to Admission  Medication Sig Dispense Refill  . alendronate (FOSAMAX) 70 MG tablet Take 70 mg once a week by mouth. Take with a full glass of water on an empty stomach.    . cycloSPORINE (RESTASIS) 0.05 % ophthalmic emulsion Place 1 drop 2 (two) times daily into both eyes.    . fluorometholone (FML) 0.1 % ophthalmic suspension     . folic acid (FOLVITE) 1 MG tablet Take 1 tablet (1 mg total) by mouth daily. 90 tablet 4  . HYDROcodone-acetaminophen (NORCO/VICODIN) 5-325 MG tablet Take 1-2 tablets by mouth every 4 (four) hours as needed for moderate pain. 45 tablet 0  . methotrexate (RHEUMATREX) 2.5 MG tablet TAKE 4 TABLETS (10 MG TOTAL) BY MOUTH ONCE A WEEK. SATURDAY 48 tablet 0  . Polyethyl Glycol-Propyl Glycol (SYSTANE OP) Apply 1 drop to eye daily as needed (dry eyes).    Marland Kitchen sulfaSALAzine (AZULFIDINE) 500 MG tablet TAKE 1 TABLET (500 MG TOTAL) BY MOUTH TWO TIMES DAILY. 180 tablet 0  . Vitamin D, Ergocalciferol, (DRISDOL) 50000 units CAPS capsule TAKE ONE CAPSULE BY MOUTH PER MONTH 6 capsule 0  . diclofenac sodium (VOLTAREN) 1 % GEL APPLY THREE GRAMS TO THREE LARGE JOINTS UP TO THREE TIMES A DAY 500 g 2    No results found for this or any previous visit (from the past 48 hour(s)). No results found.  Review of Systems  Respiratory: Negative.   Cardiovascular:  Negative.   Gastrointestinal: Negative.   Genitourinary: Negative.     Blood pressure (!) 152/89, pulse 83, temperature 98.4 F (36.9 C), temperature source Oral, resp. rate 20, height 4\' 11"  (1.499 m), weight 38.6 kg (85 lb), SpO2 100 %. Physical Exam  Retained hardware right ring and small finger The patient is alert and oriented in no acute distress. The patient complains of pain in the affected upper extremity.  The patient is noted to have a normal HEENT exam. Lung fields show equal chest expansion and no shortness of breath. Abdomen exam is nontender without  distention. Lower extremity examination does not show any fracture dislocation or blood clot symptoms. Pelvis is stable and the neck and back are stable and nontender. Assessment/Plan We are planning surgery for your upper extremity. The risk and benefits of surgery to include risk of bleeding, infection, anesthesia,  damage to normal structures and failure of the surgery to accomplish its intended goals of relieving symptoms and restoring function have been discussed in detail. With this in mind we plan to proceed. I have specifically discussed with the patient the pre-and postoperative regime and the dos and don'ts and risk and benefits in great detail. Risk and benefits of surgery also include risk of dystrophy(CRPS), chronic nerve pain, failure of the healing process to go onto completion and other inherent risks of surgery The relavent the pathophysiology of the disease/injury process, as well as the alternatives for treatment and postoperative course of action has been discussed in great detail with the patient who desires to proceed.  We will do everything in our power to help you (the patient) restore function to the upper extremity. It is a pleasure to see this patient today.   III, MD 09/20/2017, 5:01 PM

## 2017-09-21 ENCOUNTER — Encounter (HOSPITAL_COMMUNITY): Payer: Self-pay | Admitting: Orthopedic Surgery

## 2017-09-21 DIAGNOSIS — Z79899 Other long term (current) drug therapy: Secondary | ICD-10-CM | POA: Diagnosis not present

## 2017-09-21 DIAGNOSIS — T8489XA Other specified complication of internal orthopedic prosthetic devices, implants and grafts, initial encounter: Secondary | ICD-10-CM | POA: Diagnosis present

## 2017-09-21 DIAGNOSIS — T8484XA Pain due to internal orthopedic prosthetic devices, implants and grafts, initial encounter: Secondary | ICD-10-CM | POA: Diagnosis present

## 2017-09-21 DIAGNOSIS — K219 Gastro-esophageal reflux disease without esophagitis: Secondary | ICD-10-CM | POA: Diagnosis present

## 2017-09-21 DIAGNOSIS — Z7983 Long term (current) use of bisphosphonates: Secondary | ICD-10-CM | POA: Diagnosis not present

## 2017-09-21 DIAGNOSIS — Z96692 Finger-joint replacement of left hand: Secondary | ICD-10-CM | POA: Diagnosis present

## 2017-09-21 DIAGNOSIS — M069 Rheumatoid arthritis, unspecified: Secondary | ICD-10-CM | POA: Diagnosis present

## 2017-09-21 DIAGNOSIS — G8929 Other chronic pain: Secondary | ICD-10-CM | POA: Diagnosis present

## 2017-09-21 NOTE — Evaluation (Signed)
Occupational Therapy Evaluation Patient Details Name: [name] MRN: [id number] DOB: 04-Aug-1941 Today's Date: 09/21/2017    History of Present Illness 76 y.o. Female with PHM including arthritis, cataracts, GERD, and finger arthroplasty. S/p hardware removal ring and small finger with tendon tenolysis.    Clinical Impression   PTA, pt was living with her son who assists with ADLs as needed. Currently, pt requires Min Guard-Min A for LB ADLs and functional mobility due to decreased balance. Pt demonstrating near baseline function. Provided education on edema management, toilet transfer, and tub transfer with seat; pt demonstrated understanding. Answered all pt questions. Recommend dc home with initial 24 hour supervision once medically stable per physician. All acute OT needs met and will sign off. Thank you.     Follow Up Recommendations  No OT follow up;Supervision/Assistance - 24 hour    Equipment Recommendations  None recommended by OT    Recommendations for Other Services PT consult     Precautions / Restrictions Precautions Precautions: None Restrictions Weight Bearing Restrictions: Yes RUE Weight Bearing: Non weight bearing      Mobility Bed Mobility Overal bed mobility: Modified Independent             General bed mobility comments: Increased time  Transfers Overall transfer level: Needs assistance Equipment used: None;1 person hand held assist Transfers: Sit to/from Stand Sit to Stand: Min guard;Min assist         General transfer comment: Min A for single hand held A.    Balance                                           ADL either performed or assessed with clinical judgement   ADL Overall ADL's : Needs assistance/impaired                                       General ADL Comments: Pt near baseline function with Min Guard-Min A for standing balance. Pt performing toilet and tub transfers. Pt requiring  Min A for FM skills such as buttoning.  Educated pt on keeping R hand dry. Discussed elevation and NWB status.     Vision         Perception     Praxis      Pertinent Vitals/Pain Pain Assessment: Faces Faces Pain Scale: Hurts a little bit Pain Location: R hand Pain Descriptors / Indicators: Constant;Discomfort;Grimacing Pain Intervention(s): Monitored during session;Repositioned     Hand Dominance Right   Extremity/Trunk Assessment Upper Extremity Assessment Upper Extremity Assessment: RUE deficits/detail RUE Deficits / Details: finger arthroplasty and NWB RUE: Unable to fully assess due to immobilization RUE Coordination: decreased fine motor   Lower Extremity Assessment Lower Extremity Assessment: Defer to PT evaluation   Cervical / Trunk Assessment Cervical / Trunk Assessment: Other exceptions(Scoliosis)   Communication Communication Communication: Prefers language other than English;Interpreter utilized(Used phone interpreter: [employer]: [phone])   Cognition Arousal/Alertness: Awake/alert Behavior During Therapy: WFL for tasks assessed/performed Overall Cognitive Status: Within Functional Limits for tasks assessed                                     General Comments  Phone interpreter used: [employer]: [phone]  Exercises     Shoulder Instructions      Home Living Family/patient expects to be discharged to:: Private residence Living Arrangements: Children(Son) Available Help at Discharge: Family;Available PRN/intermittently Type of Home: Apartment Home Access: Stairs to enter Entrance Stairs-Number of Steps: 7   Home Layout: One level     Bathroom Shower/Tub: Teacher, early years/pre: Standard     Home Equipment: Shower seat;Cane - single point;Walker - 2 wheels;Grab bars - tub/shower          Prior Functioning/Environment Level of Independence: Independent        Comments: Son assists as  needed for ADLs. use of cane and RW for mobility outside home        OT Problem List: Decreased activity tolerance;Impaired balance (sitting and/or standing);Decreased safety awareness;Decreased knowledge of use of DME or AE;Impaired UE functional use      OT Treatment/Interventions:      OT Goals(Current goals can be found in the care plan section) Acute Rehab OT Goals Patient Stated Goal: Go home OT Goal Formulation: All assessment and education complete, DC therapy  OT Frequency:     Barriers to D/C:            Co-evaluation PT/OT/SLP Co-Evaluation/Treatment: Yes Reason for Co-Treatment: Other (comment)(Use of interpreter) PT goals addressed during session: Mobility/safety with mobility OT goals addressed during session: ADL's and self-care      AM-PAC PT "6 Clicks" Daily Activity     Outcome Measure Help from another person eating meals?: None Help from another person taking care of personal grooming?: None Help from another person toileting, which includes using toliet, bedpan, or urinal?: A Little Help from another person bathing (including washing, rinsing, drying)?: A Little Help from another person to put on and taking off regular upper body clothing?: A Little Help from another person to put on and taking off regular lower body clothing?: A Little 6 Click Score: 20   End of Session Equipment Utilized During Treatment: Gait belt Nurse Communication: Mobility status  Activity Tolerance: Patient tolerated treatment well Patient left: in chair;with call bell/phone within reach;with chair alarm set  OT Visit Diagnosis: Unsteadiness on feet (R26.81);Other abnormalities of gait and mobility (R26.89)                Time: 1122-1150 OT Time Calculation (min): 28 min Charges:  OT General Charges $OT Visit: 1 Visit OT Evaluation $OT Eval Low Complexity: 1 Low G-Codes: OT G-codes **NOT FOR INPATIENT CLASS** Functional Assessment Tool Used: Clinical  judgement Functional Limitation: Self care Self Care Current Status (F6384): At least 20 percent but less than 40 percent impaired, limited or restricted Self Care Goal Status (Y6599): At least 1 percent but less than 20 percent impaired, limited or restricted Self Care Discharge Status [phone]): At least 20 percent but less than 40 percent impaired, limited or restricted   Lead, OTR/L Acute Rehab Pager: [phone] Office: [place] 09/21/2017, 12:37 PM

## 2017-09-21 NOTE — Evaluation (Signed)
Physical Therapy Evaluation Patient Details Name: [name] MRN: [id number] DOB: July 19, 1941 Today's Date: 09/21/2017   History of Present Illness  [name] is a 76yo Asian Female (some English, prefers [place]) with PHM including Rheumatoid Arthritis, cataracts, GERD, and finger arthroplasty. S/p hardware removal ring and small finger with tendon tenolysis. At baseline, patient performs mostly household AMB, out of house for medical appointments only, SPC to RW in home, non-compliant with supportive orthotic shoes for financial reasons, but with moderate RLE medical collapse d/t RA.   Clinical Impression  Pt admitted with above diagnosis. Pt currently with functional limitations due to the deficits listed below (see "PT Problem List"). Co-evaluation performed with PT due to need for interpreter services. OT performed assessment of bed mobility in this note, and PT assessing transfers, AMB, and stairs performance. Functional mobility assessment demonstrates mild bradykinesia of gross motor systems mild weakness in BLE strength, the pt requiring moderate effort and additional time to perform transfers, AMB, and stairs, however patient reports this as her baseline. She denies concerns over performing safe basic functional mobility at baseline. OT performs all skilled intervention and education regarding surgical procedure and precautions. Patient is at baseline, all education completed, and time is given to address all questions/concerns. No additional skilled PT services needed at this time, PT signing off. PT recommends daily ambulation ad lib or with nursing staff as needed to prevent deconditioning.     Follow Up Recommendations No PT follow up    Equipment Recommendations  None recommended by PT    Recommendations for Other Services       Precautions / Restrictions Precautions Precautions: Fall Restrictions Weight Bearing Restrictions: Yes RUE Weight Bearing: Non weight  bearing      Mobility  Bed Mobility Overal bed mobility: Modified Independent             General bed mobility comments: Increased time  Transfers Overall transfer level: Needs assistance Equipment used: None;1 person hand held assist Transfers: Sit to/from Stand Sit to Stand: Min guard;Min assist         General transfer comment: Min A for single hand held A.  Ambulation/Gait Ambulation/Gait assistance: Supervision Ambulation Distance (Feet): 125 Feet Assistive device: 1 person hand held assist(pt typically uses SPC ) Gait Pattern/deviations: Step-to pattern Gait velocity: 0.84m/s   General Gait Details: stable and balanced; moderate RLE medial collapse and increased toe-out typical of RA   Stairs Stairs: Yes Stairs assistance: Supervision Stair Management: One rail Left;Alternating pattern Number of Stairs: 8 General stair comments: slow but confident, typically uses 2 rails   Wheelchair Mobility    Modified Rankin (Stroke Patients Only)       Balance Overall balance assessment: No apparent balance deficits (not formally assessed);Modified Independent                                           Pertinent Vitals/Pain Pain Assessment: Faces Faces Pain Scale: Hurts a little bit Pain Location: R hand Pain Descriptors / Indicators: Constant;Discomfort;Grimacing Pain Intervention(s): Monitored during session;Repositioned    Home Living Family/patient expects to be discharged to:: Private residence Living Arrangements: Children(Son) Available Help at Discharge: Family;Available PRN/intermittently Type of Home: Apartment Home Access: Stairs to enter   Entrance Stairs-Number of Steps: 7 Home Layout: One level Home Equipment: Shower seat;Cane - single point;Walker - 2 wheels;Grab bars - tub/shower  Prior Function Level of Independence: Independent         Comments: Son assists as needed for ADLs. use of cane and RW for  mobility outside home     Hand Dominance   Dominant Hand: Right    Extremity/Trunk Assessment   Upper Extremity Assessment Upper Extremity Assessment: RUE deficits/detail RUE Deficits / Details: finger arthroplasty and NWB RUE: Unable to fully assess due to immobilization RUE Coordination: decreased fine motor    Lower Extremity Assessment Lower Extremity Assessment: Defer to PT evaluation    Cervical / Trunk Assessment Cervical / Trunk Assessment: Other exceptions(Scoliosis)  Communication   Communication: Prefers language other than [place];Interpreter utilized(Used phone interpreter: [employer]: [phone])  Cognition Arousal/Alertness: Awake/alert Behavior During Therapy: WFL for tasks assessed/performed Overall Cognitive Status: Within Functional Limits for tasks assessed                                        General Comments General comments (skin integrity, edema, etc.): Phone interpreter used. [employer] [phone]    Exercises     Assessment/Plan    PT Assessment Patent does not need any further PT services  PT Problem List         PT Treatment Interventions      PT Goals (Current goals can be found in the Care Plan section)  Acute Rehab PT Goals Patient Stated Goal: Go home PT Goal Formulation: All assessment and education complete, DC therapy    Frequency     Barriers to discharge        Co-evaluation PT/OT/SLP Co-Evaluation/Treatment: Yes Reason for Co-Treatment: Other (comment)(use of interpreter) PT goals addressed during session: Mobility/safety with mobility OT goals addressed during session: ADL's and self-care       AM-PAC PT "6 Clicks" Daily Activity  Outcome Measure Difficulty turning over in bed (including adjusting bedclothes, sheets and blankets)?: A Little Difficulty moving from lying on back to sitting on the side of the bed? : A Little Difficulty sitting down on and standing up from a chair  with arms (e.g., wheelchair, bedside commode, etc,.)?: A Little Help needed moving to and from a bed to chair (including a wheelchair)?: A Little Help needed walking in hospital room?: None Help needed climbing 3-5 steps with a railing? : A Little 6 Click Score: 19    End of Session Equipment Utilized During Treatment: Gait belt Activity Tolerance: Patient tolerated treatment well Patient left: in chair;with call bell/phone within reach;with chair alarm set        Time: 1130-1149 PT Time Calculation (min) (ACUTE ONLY): 19 min   Charges:   PT Evaluation $PT Eval Moderate Complexity: 1 Mod     PT G Codes:   PT G-Codes **NOT FOR INPATIENT CLASS** Functional Assessment Tool Used: AM-PAC 6 Clicks Basic Mobility;Clinical judgement Functional Limitation: Mobility: Walking and moving around Mobility: Walking and Moving Around Current Status (A1287): At least 20 percent but less than 40 percent impaired, limited or restricted Mobility: Walking and Moving Around Goal Status [phone]): At least 20 percent but less than 40 percent impaired, limited or restricted Mobility: Walking and Moving Around Discharge Status [phone]): At least 20 percent but less than 40 percent impaired, limited or restricted    1:16 PM, 09/21/17 Rosamaria Lints, PT, DPT [facility] Physical Therapist - [place] [phone] (Pager)  [phone] [facility])  [phone] (Office)     Glennys Schorsch C 09/21/2017,  1:07 PM

## 2017-09-21 NOTE — Op Note (Signed)
NAME:  [name], [name]                  ACCOUNT NO.:  MEDICAL RECORD NO.:  [id number]  LOCATION:                                 FACILITY:  PHYSICIAN:  Dionne Ano. Amanda Pea, M.D.     DATE OF BIRTH:  DATE OF PROCEDURE: DATE OF DISCHARGE:                              OPERATIVE REPORT   PREOPERATIVE DIAGNOSES: 1. Retained hardware, painful in nature, right small finger. 2. Retained hardware, painful in nature, right ring finger.  POSTOPERATIVE DIAGNOSES: 1. Retained hardware, painful in nature, right small finger. 2. Retained hardware, painful in nature, right ring finger.  PROCEDURES: 1. Right small finger deep hardware removal. 2. Radical extensor tenolysis and tenosynovectomy, right small finger. 3. Four-view x-ray series, right small finger. 4. Deep hardware removal, right ring finger. 5. Extensor tenolysis and tenosynovectomy, extensive in nature, right     ring finger. 6. Four-view radiographic series, right ring finger.  SURGEON:  Dionne Ano. Amanda Pea, MD.  ASSISTANT:  Karie Chimera, PA-C.  COMPLICATIONS:  None.  ANESTHESIA:  Block with IV sedation.  TOURNIQUET TIME:  Less than 45 minutes.  INDICATIONS:  A 76 year old female presents with the above-mentioned diagnosis.  I have counseled him in regard to risks and benefits surgery, and she desires to proceed with the above-mentioned operative intervention.  All questions have been encouraged and answered.  OPERATIVE PROCEDURE:  The patient was seen by myself and Anesthesia, taken to the operative theater.  Block was in excellent working fashion. She was prepped and draped in the usual sterile fashion with Betadine scrub and paint.  Once this was complete, the patient then underwent a very careful and cautious approach to the extremity with incision dorsoradially made about the small finger.  Dissection was carried down. A tension band wire was identified as was deep hardware.  I had to at this time perform a radical  and extensive tenolysis and tenosynovectomy without difficulty.  I did this very carefully under 4.0 loupe magnification.  Following this, deep hardware was removed followed by 4-view radiographic series demonstrating complete removal and solid PIP fusion. I was pleased with this, irrigated and ultimately closed the wound with 5-0 Prolene.  Following this, attention was turned towards the ring finger where very carefully and cautiously we performed an incision dorsoradially. Dissection was carried down.  Extensor tenolysis and tenosynovectomy were accomplished without difficulty given the significant scar tissue. Following this, we were able to access the hardware and remove it without difficulty.  The tendon was stable.  DIP range of motion looked good as it did on the small finger, and the PIP fusion looked excellent under 4-view radiographic series.  The patient tolerated this well and there were no complicating features.  We irrigated copiously and ultimately closed the wound with 5-0 Prolene. There were no complicating features.  All sponge, needle, and instrument counts were reported as correct.  Soft dressing was applied.  No complications were noted.  She will be admitted for IV antibiotics and general postop observation.     Dionne Ano. Amanda Pea, M.D.     [facility]  D:  09/20/2017  T:  09/20/2017  Job:  [id number]

## 2017-09-21 NOTE — Progress Notes (Signed)
Subjective: 1 Day Post-Op Procedure(s) (LRB): Right hand hardware removal  ring and small fingers (Right) Patient reports pain to the right hand status post signed the and hardware removal. She has diffuse generalized pain complaints secondary to her chronic rheumatoid arthritis.    Objective: Vital signs in last 24 hours: Temp:  [98 F (36.7 C)-98.4 F (36.9 C)] 98.4 F (36.9 C) (11/21 0504) Pulse Rate:  [75-101] 82 (11/21 0504) Resp:  [13-25] 18 (11/21 0504) BP: (86-167)/(53-95) 119/73 (11/21 0504) SpO2:  [98 %-100 %] 98 % (11/21 0504) Weight:  [38.6 kg (85 lb)] 38.6 kg (85 lb) (11/20 1348)  Intake/Output from previous day: 11/20 0701 - 11/21 0700 In: 1125 [P.O.:440; I.V.:635; IV Piggyback:50] Out: 300 [Urine:300] Intake/Output this shift: No intake/output data recorded.  No results for input(s): HGB in the last 72 hours. No results for input(s): WBC, RBC, HCT, PLT in the last 72 hours. No results for input(s): NA, K, CL, CO2, BUN, CREATININE, GLUCOSE, CALCIUM in the last 72 hours. No results for input(s): LABPT, INR in the last 72 hours.  The patient is alert and oriented in no acute distress. The patient complains of pain in the affected upper extremity.  The patient is noted to have a normal HEENT exam. Lung fields show equal chest expansion and no shortness of breath. Abdomen exam is nontender without distention. Lower extremity examination does not show any fracture dislocation or blood clot symptoms. Pelvis is stable and the neck and back are stable and nontender. Examination of the right upper extremity shows that her dressings are clean dry and intact is no signs of infection present  Assessment/Plan: 1 Day Post-Op Procedure(s) (LRB): Right hand hardware removal  ring and small fingers (Right) We will continue pain management, IV antibiotics given her increased propensity for infection secondary to severe rheumatoid arthritis and continue close observer. We will  look towards discharged tomorrow pending her repeat examination.  Edge Mauger L 09/21/2017, 8:50 AM

## 2017-09-22 ENCOUNTER — Other Ambulatory Visit: Payer: Self-pay

## 2017-09-22 NOTE — Progress Notes (Signed)
Patient ID: [name], female   DOB: 05/14/1941, 76 y.o.   MRN: [id number] Patient is generally doing quite well.  We'll plan for discharge tomorrow if appropriate.  The patient is alert and oriented in no acute distress. The patient complains of pain in the affected upper extremity.  The patient is noted to have a normal HEENT exam. Lung fields show equal chest expansion and no shortness of breath. Abdomen exam is nontender without distention. Lower extremity examination does not show any fracture dislocation or blood clot symptoms. Pelvis is stable and the neck and back are stable and nontender.   All questions have been encouraged and answered.  Zylan Almquist M.D.

## 2017-09-23 MED ORDER — HYDROCODONE-ACETAMINOPHEN 5-325 MG PO TABS: 2.0000 | ORAL_TABLET | Freq: Four times a day (QID) | ORAL | 0 refills | Status: DC | PRN

## 2017-09-23 NOTE — Progress Notes (Signed)
Patient ID: [name], female   DOB: [birth year], 76 y.o.   MRN: [id number] Patient looks great. Dressing changed at bedside number no problematic features  She'll be discharged home today  [place] when necessary pain.  She see me in 10 days.  This was none uneventful hospital course  Adira Limburg M.D.

## 2017-09-23 NOTE — Plan of Care (Signed)
  Progressing Education: Knowledge of General Education information will improve 09/23/2017 1215 - Progressing by Darreld Mclean, RN Health Behavior/Discharge Planning: Ability to manage health-related needs will improve 09/23/2017 1215 - Progressing by Darreld Mclean, RN Clinical Measurements: Ability to maintain clinical measurements within normal limits will improve 09/23/2017 1215 - Progressing by Darreld Mclean, RN Will remain free from infection 09/23/2017 1215 - Progressing by Darreld Mclean, RN Diagnostic test results will improve 09/23/2017 1215 - Progressing by Darreld Mclean, RN Respiratory complications will improve 09/23/2017 1215 - Progressing by Darreld Mclean, RN Cardiovascular complication will be avoided 09/23/2017 1215 - Progressing by Darreld Mclean, RN Activity: Risk for activity intolerance will decrease 09/23/2017 1215 - Progressing by Darreld Mclean, RN Nutrition: Adequate nutrition will be maintained 09/23/2017 1215 - Progressing by Darreld Mclean, RN Coping: Level of anxiety will decrease 09/23/2017 1215 - Progressing by Darreld Mclean, RN Elimination: Will not experience complications related to bowel motility 09/23/2017 1215 - Progressing by Darreld Mclean, RN Will not experience complications related to urinary retention 09/23/2017 1215 - Progressing by Darreld Mclean, RN Pain Managment: General experience of comfort will improve 09/23/2017 1215 - Progressing by Darreld Mclean, RN Safety: Ability to remain free from injury will improve 09/23/2017 1215 - Progressing by Darreld Mclean, RN Skin Integrity: Risk for impaired skin integrity will decrease 09/23/2017 1215 - Progressing by Darreld Mclean, RN

## 2017-09-23 NOTE — Discharge Instructions (Signed)

## 2017-09-23 NOTE — Discharge Summary (Signed)
Physician Discharge Summary  Patient ID: [name] MRN: [id number] DOB/AGE: 03/13/1941 76 y.o.  Admit date: 09/20/2017 Discharge date:   Admission Diagnoses: Right hand retained hardware Past Medical History:  Diagnosis Date  . Arthritis    RA, sees Dr.  Ethelene Hal  . Cataracts, bilateral    hx of  . Chronic back pain   . Complication of anesthesia   . GERD (gastroesophageal reflux disease)   . Headache(784.0)   . PONV (postoperative nausea and vomiting)     Discharge Diagnoses:  Active Problems:   Rheumatoid arthritis (HCC)   Surgeries: Procedure(s): Right hand hardware removal  ring and small fingers on 09/20/2017    Consultants:   Discharged Condition: Improved  Hospital Course: [name] is an 76 y.o. female who was admitted 09/20/2017 with a chief complaint of No chief complaint on file. , and found to have a diagnosis of Right hand retained hardware.  They were brought to the operating room on 09/20/2017 and underwent Procedure(s): Right hand hardware removal  ring and small fingers.    They were given perioperative antibiotics:  Anti-infectives (From admission, onward)   Start     Dose/Rate Route Frequency Ordered Stop   09/21/17 0600  ceFAZolin (ANCEF) IVPB 2g/100 mL premix     2 g 200 mL/hr over 30 Minutes Intravenous To ShortStay Surgical 09/20/17 1336 09/20/17 1718   09/21/17 0500  ceFAZolin (ANCEF) IVPB 1 g/50 mL premix     1 g 100 mL/hr over 30 Minutes Intravenous Every 8 hours 09/20/17 2049     09/20/17 2300  ceFAZolin (ANCEF) IVPB 1 g/50 mL premix     1 g 100 mL/hr over 30 Minutes Intravenous NOW 09/20/17 2049 09/20/17 2242   09/20/17 1345  ceFAZolin (ANCEF) 2-4 GM/100ML-% IVPB    Comments:  Scronce, Trina   : cabinet override      09/20/17 1345 09/20/17 1718    .  They were given sequential compression devices, early ambulation, and Other (comment)   for DVT prophylaxis.  Recent vital signs:  Patient Vitals for the past 24 hrs:  BP Temp Temp src Pulse Resp SpO2  09/23/17 0454 140/80 98.6 F (37 C) Oral (!) 30 16 100 %  09/22/17 2125 101/61 97.7 F (36.5 C) Oral (!) 101 16 98 %  09/22/17 1447 107/61 98.2 F (36.8 C) Oral 100 18 98 %  .  Recent laboratory studies: No results found.  Discharge Medications:   Allergies as of 09/23/2017   No Known Allergies     Medication List    TAKE these medications   alendronate 70 MG tablet Commonly known as:  FOSAMAX Take 70 mg once a week by mouth. Take with a full glass of water on an empty stomach.   cycloSPORINE 0.05 % ophthalmic emulsion Commonly known as:  RESTASIS Place 1 drop 2 (two) times daily into both eyes.   diclofenac sodium 1 % Gel Commonly known as:  VOLTAREN APPLY THREE GRAMS TO THREE LARGE JOINTS UP TO THREE TIMES A DAY   fluorometholone 0.1 % ophthalmic suspension Commonly known as:  FML   folic acid 1 MG tablet Commonly known as:  FOLVITE Take 1 tablet (1 mg total) by mouth daily.   HYDROcodone-acetaminophen 5-325 MG tablet Commonly known as:  NORCO/VICODIN Take 1-2 tablets by mouth every 4 (four) hours as needed for moderate pain. What changed:  Another medication with the same name was added. Make sure you understand how and when to take  each.   HYDROcodone-acetaminophen 5-325 MG tablet Commonly known as:  NORCO/VICODIN Take 2 tablets by mouth every 6 (six) hours as needed for moderate pain. What changed:  You were already taking a medication with the same name, and this prescription was added. Make sure you understand how and when to take each.   methotrexate 2.5 MG tablet Commonly known as:  RHEUMATREX TAKE 4 TABLETS (10 MG TOTAL) BY MOUTH ONCE A WEEK. SATURDAY   sulfaSALAzine 500 MG tablet Commonly known as:  AZULFIDINE TAKE 1 TABLET (500 MG TOTAL) BY MOUTH TWO TIMES DAILY.   SYSTANE OP Apply 1 drop to eye daily as needed (dry eyes).   Vitamin D (Ergocalciferol) 50000 units Caps capsule Commonly known as:  DRISDOL TAKE  ONE CAPSULE BY MOUTH PER MONTH       Diagnostic Studies: No results found.  They benefited maximally from their hospital stay and there were no complications.     Disposition: 01-Home or Self Care Discharge Instructions    Call MD / Call 911   Complete by:  As directed    If you experience chest pain or shortness of breath, CALL 911 and be transported to the hospital emergency room.  If you develope a fever above 101 F, pus (white drainage) or increased drainage or redness at the wound, or calf pain, call your surgeon's office.   Constipation Prevention   Complete by:  As directed    Drink plenty of fluids.  Prune juice may be helpful.  You may use a stool softener, such as Colace (over the counter) 100 mg twice a day.  Use MiraLax (over the counter) for constipation as needed.   Diet - low sodium heart healthy   Complete by:  As directed    Increase activity slowly as tolerated   Complete by:  As directed      Follow-up Information    Dominica Severin, MD Follow up in 10 day(s).   Specialty:  Orthopedic Surgery Why:  Please call so that Dr. Amanda Pea can see you in 10 days Contact information: [address] [place] [address] [phone]          status post hardware removal and tenolysis of the extensor apparatus.  Patient was admitted for postop pain control and did well during her hospitalization. She'll be discharged home and follow-up in 10 days  There were no events this hospitalization. I performed dressing change at bedside Friday and the wound looked excellent. I redressed the bandage  Signed: Oletta Cohn III 09/23/2017, 10:17 AM

## 2017-09-27 ENCOUNTER — Encounter: Payer: Self-pay | Admitting: Rheumatology

## 2017-09-27 ENCOUNTER — Ambulatory Visit (INDEPENDENT_AMBULATORY_CARE_PROVIDER_SITE_OTHER): Payer: Medicare Other | Admitting: Rheumatology

## 2017-09-27 VITALS — BP 135/86 | HR 92 | Resp 14 | Ht <= 58 in | Wt 87.0 lb

## 2017-09-27 DIAGNOSIS — T372X5A Adverse effect of antimalarials and drugs acting on other blood protozoa, initial encounter: Secondary | ICD-10-CM | POA: Diagnosis not present

## 2017-09-27 DIAGNOSIS — Z8719 Personal history of other diseases of the digestive system: Secondary | ICD-10-CM | POA: Diagnosis not present

## 2017-09-27 DIAGNOSIS — M0579 Rheumatoid arthritis with rheumatoid factor of multiple sites without organ or systems involvement: Secondary | ICD-10-CM | POA: Diagnosis not present

## 2017-09-27 DIAGNOSIS — Z8739 Personal history of other diseases of the musculoskeletal system and connective tissue: Secondary | ICD-10-CM

## 2017-09-27 DIAGNOSIS — Z789 Other specified health status: Secondary | ICD-10-CM | POA: Diagnosis not present

## 2017-09-27 DIAGNOSIS — M05742 Rheumatoid arthritis with rheumatoid factor of left hand without organ or systems involvement: Secondary | ICD-10-CM | POA: Diagnosis not present

## 2017-09-27 DIAGNOSIS — R7612 Nonspecific reaction to cell mediated immunity measurement of gamma interferon antigen response without active tuberculosis: Secondary | ICD-10-CM

## 2017-09-27 DIAGNOSIS — M05741 Rheumatoid arthritis with rheumatoid factor of right hand without organ or systems involvement: Secondary | ICD-10-CM | POA: Diagnosis not present

## 2017-09-27 DIAGNOSIS — Z79899 Other long term (current) drug therapy: Secondary | ICD-10-CM

## 2017-09-27 DIAGNOSIS — M810 Age-related osteoporosis without current pathological fracture: Secondary | ICD-10-CM

## 2017-09-27 DIAGNOSIS — Z87898 Personal history of other specified conditions: Secondary | ICD-10-CM | POA: Diagnosis not present

## 2017-09-27 DIAGNOSIS — H35389 Toxic maculopathy, unspecified eye: Secondary | ICD-10-CM | POA: Diagnosis not present

## 2017-09-27 LAB — HEPATIC FUNCTION PANEL
AG Ratio: 1.3 (calc) (ref 1.0–2.5)
ALT: 15 U/L (ref 6–29)
AST: 30 U/L (ref 10–35)
Albumin: 4 g/dL (ref 3.6–5.1)
Alkaline phosphatase (APISO): 58 U/L (ref 33–130)
BILIRUBIN DIRECT: 0.1 mg/dL (ref 0.0–0.2)
BILIRUBIN INDIRECT: 0.4 mg/dL (ref 0.2–1.2)
BILIRUBIN TOTAL: 0.5 mg/dL (ref 0.2–1.2)
Globulin: 3.2 g/dL (calc) (ref 1.9–3.7)
Total Protein: 7.2 g/dL (ref 6.1–8.1)

## 2017-09-27 MED ORDER — DICLOFENAC SODIUM 1 % TD GEL: TRANSDERMAL | 2 refills | Status: DC

## 2017-09-27 NOTE — Patient Instructions (Signed)
Standing Labs We placed an order today for your standing lab work.    Please come back and get your standing labs in February and every 3 months  We have open lab Monday through Friday from 8:30-11:30 AM and 1:30-4 PM at the office of Dr. Kelcie Currie.   The office is located at [address], [address], [place], [place] [address] No appointment is necessary.   Labs are drawn by [facility].  You may receive a bill from [facility] for your lab work. If you have any questions regarding directions or hours of operation,  please call [phone].    

## 2017-09-28 NOTE — Progress Notes (Signed)
Liver functions are normal.

## 2017-10-04 NOTE — Anesthesia Postprocedure Evaluation (Signed)
Anesthesia Post Note  Patient: [name]  Procedure(s) Performed: Right hand hardware removal  ring and small fingers (Right Finger)     Patient location during evaluation: PACU Anesthesia Type: MAC Level of consciousness: awake, awake and alert and oriented Pain management: pain level controlled Vital Signs Assessment: post-procedure vital signs reviewed and stable Respiratory status: spontaneous breathing, nonlabored ventilation and respiratory function stable Cardiovascular status: blood pressure returned to baseline Anesthetic complications: no    Last Vitals:  Vitals:   09/23/17 0454 09/23/17 1300  BP: 140/80 101/61  Pulse: (!) 30 (!) 103  Resp: 16 18  Temp: 37 C 36.9 C  SpO2: 100% 100%    Last Pain:  Vitals:   09/23/17 1300  TempSrc: Oral  PainSc:                  [name]

## 2017-11-28 ENCOUNTER — Encounter (INDEPENDENT_AMBULATORY_CARE_PROVIDER_SITE_OTHER): Payer: Medicare Other | Admitting: Ophthalmology

## 2017-11-28 DIAGNOSIS — M5137 Other intervertebral disc degeneration, lumbosacral region: Secondary | ICD-10-CM | POA: Insufficient documentation

## 2017-12-01 ENCOUNTER — Encounter (INDEPENDENT_AMBULATORY_CARE_PROVIDER_SITE_OTHER): Payer: Medicare Other | Admitting: Ophthalmology

## 2017-12-01 DIAGNOSIS — H353132 Nonexudative age-related macular degeneration, bilateral, intermediate dry stage: Secondary | ICD-10-CM | POA: Diagnosis not present

## 2017-12-01 DIAGNOSIS — H43813 Vitreous degeneration, bilateral: Secondary | ICD-10-CM | POA: Diagnosis not present

## 2017-12-01 DIAGNOSIS — M069 Rheumatoid arthritis, unspecified: Secondary | ICD-10-CM

## 2018-01-13 NOTE — Progress Notes (Signed)
Office Visit Note  Patient: [name]             Date of Birth: 22-Oct-1941           MRN: [id number]             PCP: Georgann Housekeeper, MD Referring: Georgann Housekeeper, MD Visit Date: 01/26/2018 Occupation: @GUAROCC @   Interpreter:  Subjective:  Other (BIL hand pain )   History of Present Illness: [name] is a 77 y.o. female history of seropositive rheumatoid arthritis.  Patient complains of pain and swelling and discomfort in bilateral hands and bilateral knee joints.  She states she has been getting bone densities through Dr. 70 office.  She has been taking Fosamax once a week.  Activities of Daily Living:  Patient reports morning stiffness for 0 minute.   Patient Reports nocturnal pain.  Lower back pain Difficulty dressing/grooming: Denies Difficulty climbing stairs: Reports Difficulty getting out of chair: Reports Difficulty using hands for taps, buttons, cutlery, and/or writing: Reports   Review of Systems  Constitutional: Positive for fatigue. Negative for night sweats, weight gain and weight loss.  HENT: Positive for mouth dryness. Negative for mouth sores, trouble swallowing, trouble swallowing and nose dryness.   Eyes: Negative for pain, redness, visual disturbance and dryness.  Respiratory: Negative for cough, shortness of breath and difficulty breathing.   Cardiovascular: Negative for chest pain, palpitations, hypertension, irregular heartbeat and swelling in legs/feet.  Gastrointestinal: Negative for blood in stool, constipation and diarrhea.  Endocrine: Negative for increased urination.  Genitourinary: Negative for vaginal dryness.  Musculoskeletal: Positive for arthralgias, joint pain, joint swelling and morning stiffness. Negative for myalgias, muscle weakness, muscle tenderness and myalgias.  Skin: Negative for color change, rash, hair loss, skin tightness, ulcers and sensitivity to sunlight.  Allergic/Immunologic: Negative for  susceptible to infections.  Neurological: Negative for dizziness, memory loss, night sweats and weakness.  Hematological: Negative for swollen glands.  Psychiatric/Behavioral: Negative for depressed mood and sleep disturbance. The patient is not nervous/anxious.     PMFS History:  Patient Active Problem List   Diagnosis Date Noted  . Rheumatoid arthritis (HCC) 09/20/2017  . Language barrier 06/09/2017  . History of Pulmonary nodulosis  06/09/2017  . 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 High risk medication use 03/07/2017  . History of gastroesophageal reflux (GERD) 03/07/2017  . Kyphosis 03/07/2017  . Idiopathic scoliosis 03/07/2017  . Age-related osteoporosis without current pathological fracture 03/07/2017  . 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 Pulmonary nodules 03/07/2017  . Dermatitis 09/09/2016  . 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 Arthritis    RA, sees Dr.  09/28/2015  . Cataracts, bilateral    hx of  . Chronic back pain   . Complication of anesthesia   . GERD (gastroesophageal reflux disease)   . Headache(784.0)   . PONV (postoperative nausea and vomiting)     History reviewed. No pertinent family history. Past Surgical History:  Procedure Laterality Date  . EYE SURGERY     cataract bilateral  . FINGER ARTHRODESIS  06/06/2012   Procedure: ARTHRODESIS FINGER;  Surgeon: 08/06/2012, MD;  Location: [facility] OR;  Service: Orthopedics;  Laterality: Right;  RIGHT SMALL FINGER AND RING FINGER PIP FUSION WITH AUTOLOGOUS GRAFT FROM RIGHT WRIST  . FINGER ARTHROPLASTY  Left 09/26/2015   Procedure: LEFT HAND METACARPOPHALANGEAL ARTHROPLASTY INDEX MIDDLE RING AND SMALL FINGERS WITH REALIGNMENT OF ;  Surgeon: Dominica Severin, MD;  Location: [facility] OR;  Service: Orthopedics;   Laterality: Left;  . FINGER SURGERY Right 09/20/2017   screws in 4th and 5th digit  . FOOT SURGERY    . FRACTURE SURGERY     under right eye  . HAND SURGERY    . HARDWARE REMOVAL Right 09/20/2017   Procedure: Right hand hardware removal  ring and small fingers;  Surgeon: Dominica Severin, MD;  Location: [facility] OR;  Service: Orthopedics;  Laterality: Right;  60 mins  . PROXIMAL INTERPHALANGEAL FUSION (PIP) Left 04/22/2016   Procedure: PROXIMAL INTERPHALANGEAL FUSION (PIP) Index, middle, and ring;  Surgeon: Dominica Severin, MD;  Location: [facility] OR;  Service: Orthopedics;  Laterality: Left;  . REPAIR EXTENSOR TENDON Left 09/26/2015   Procedure: EXTENSOR TENDON;  Surgeon: Dominica Severin, MD;  Location: [facility];  Service: Orthopedics;  Laterality: Left;   Social History   Social History Narrative  . Not on file     Objective: Vital Signs: BP 110/70 (BP Location: Left Arm, Patient Position: Sitting, Cuff Size: Normal)   Pulse 64   Resp 13   Ht 4\' 2"  (1.27 m)   Wt 87 lb (39.5 kg)   BMI 24.47 kg/m    Physical Exam  Constitutional: She is oriented to person, place, and time. She appears well-developed and well-nourished.  HENT:  Head: Normocephalic and atraumatic.  Eyes: Conjunctivae and EOM are normal.  Neck: Normal range of motion.  Cardiovascular: Normal rate, regular rhythm, normal heart sounds and intact distal pulses.  Pulmonary/Chest: Effort normal and breath sounds normal.  Abdominal: Soft. Bowel sounds are normal.  Lymphadenopathy:    She has no cervical adenopathy.  Neurological: She is alert and oriented to person, place, and time.  Skin: Skin is warm and dry. Capillary refill takes less than 2 seconds.  Psychiatric: She has a normal mood and affect. Her behavior is normal.  Nursing note and vitals reviewed.    Musculoskeletal Exam: C-spine limited range of motion.  She has severe  thoracic kyphoscoliosis.  She has very limited range of motion of her shoulder joints.  Elbow  joints were good range of motion.  Her right wrist joint is fused.  Left wrist joint has very limited range of motion.  No synovitis was noted.  She has MCP thickening bilaterally with no synovitis.  She has discomfort over bilateral CMC joints.  She has PIP/DIP thickening with no synovitis.  Hip joints were difficult to assess.  Knee joints were in good range of motion.  She had MTP and PIP subluxation more severe in her right foot without any synovitis.  CDAI Exam: No CDAI exam completed.    Investigation: No additional findings. CBC Latest Ref Rng & Units 09/16/2017 06/16/2017 03/08/2017  WBC 4.0 - 10.5 K/uL 4.2 3.9 4.2  Hemoglobin 12.0 - 15.0 g/dL 16.3 10.9(L) 10.6(L)  Hematocrit 36.0 - 46.0 % 37.4 33.6(L) 33.3(L)  Platelets 150 - 400 K/uL 177 243 227   CMP Latest Ref Rng & Units 09/27/2017 09/16/2017 06/16/2017  Glucose 65 - 99 mg/dL - 78 89  BUN 6 - 20 mg/dL - 13 16  Creatinine 8.46 - 1.00 mg/dL - 6.59 9.35  Sodium 701 - 145 mmol/L - 135 138  Potassium 3.5 - 5.1 mmol/L - 4.4 4.5  Chloride 101 - 111 mmol/L - 103 105  CO2 22 - 32 mmol/L -  24 23  Calcium 8.9 - 10.3 mg/dL - 9.5 9.5  Total Protein 6.1 - 8.1 g/dL 7.2 - 7.2  Total Bilirubin 0.2 - 1.2 mg/dL 0.5 - 0.8  Alkaline Phos 33 - 130 U/L - - 60  AST 10 - 35 U/L 30 - 25  ALT 6 - 29 U/L 15 - 12    Imaging: No results found.  Speciality Comments: No specialty comments available.    Procedures:  No procedures performed Allergies: Patient has no known allergies.   Assessment / Plan:     Visit Diagnoses: Seropositive rheumatoid arthritis of multiple sites [facility]): Patient has severe rheumatoid arthritis which appears to be very well controlled on current regimen.  She had no active synovitis.  She has multiple contractures as described above.  Her pain is mostly coming from bilateral CMC joints.  Rheumatoid arthritis involving both hands with positive rheumatoid factor (HCC) - history of hand surgeries by DrGramig  High risk  medication use - Methotrexate  4 tablets by mouth every week, folic acid 1mg  by mouth daily, SSZ  500 mg 1 tablet by outh twice a day - Plan: CBC with Differential/Platelet, COMPLETE METABOLIC PANEL WITH GFR, today and then every 3 months to monitor for drug toxicity.  CBC with Differential/Platelet, COMPLETE METABOLIC PANEL WITH GFR  Age-related osteoporosis without current pathological fracture - DEXA 2012 -3.9 T score of the distal radius . She supposed to be taking Fosamax. And is followed by Dr. 2013. - Plan: VITAMIN D 25 Hydroxy (Vit-D Deficiency, Fractures) will be checked today.  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 CBC with Differential/Platelet  . COMPLETE METABOLIC PANEL WITH GFR  . CBC with Differential/Platelet  . COMPLETE METABOLIC PANEL WITH GFR  . VITAMIN D 25 Hydroxy (Vit-D Deficiency, Fractures)   No orders of the defined types were placed in this encounter.   Face-to-face time spent with patient was 30 minutes.  Greater than 50% of time was spent in counseling and coordination of care.  Follow-Up Instructions: Return in about 5 months (around 06/28/2018) for Rheumatoid arthritis, Osteoporosis.   06/30/2018, MD  Note - This record has been created using Pollyann Savoy.  Chart creation errors have been sought, but may not always  have been located. Such creation errors do not reflect on  the standard of medical care.

## 2018-01-26 ENCOUNTER — Ambulatory Visit (INDEPENDENT_AMBULATORY_CARE_PROVIDER_SITE_OTHER): Payer: Medicare Other | Admitting: Rheumatology

## 2018-01-26 ENCOUNTER — Encounter: Payer: Self-pay | Admitting: Rheumatology

## 2018-01-26 VITALS — BP 110/70 | HR 64 | Resp 13 | Ht <= 58 in | Wt 87.0 lb

## 2018-01-26 DIAGNOSIS — M05741 Rheumatoid arthritis with rheumatoid factor of right hand without organ or systems involvement: Secondary | ICD-10-CM | POA: Diagnosis not present

## 2018-01-26 DIAGNOSIS — M810 Age-related osteoporosis without current pathological fracture: Secondary | ICD-10-CM | POA: Diagnosis not present

## 2018-01-26 DIAGNOSIS — M0579 Rheumatoid arthritis with rheumatoid factor of multiple sites without organ or systems involvement: Secondary | ICD-10-CM | POA: Diagnosis not present

## 2018-01-26 DIAGNOSIS — R7612 Nonspecific reaction to cell mediated immunity measurement of gamma interferon antigen response without active tuberculosis: Secondary | ICD-10-CM | POA: Diagnosis not present

## 2018-01-26 DIAGNOSIS — H35389 Toxic maculopathy, unspecified eye: Secondary | ICD-10-CM

## 2018-01-26 DIAGNOSIS — Z8719 Personal history of other diseases of the digestive system: Secondary | ICD-10-CM

## 2018-01-26 DIAGNOSIS — Z87898 Personal history of other specified conditions: Secondary | ICD-10-CM | POA: Diagnosis not present

## 2018-01-26 DIAGNOSIS — Z8739 Personal history of other diseases of the musculoskeletal system and connective tissue: Secondary | ICD-10-CM | POA: Diagnosis not present

## 2018-01-26 DIAGNOSIS — M05742 Rheumatoid arthritis with rheumatoid factor of left hand without organ or systems involvement: Secondary | ICD-10-CM

## 2018-01-26 DIAGNOSIS — Z79899 Other long term (current) drug therapy: Secondary | ICD-10-CM

## 2018-01-26 DIAGNOSIS — T372X5A Adverse effect of antimalarials and drugs acting on other blood protozoa, initial encounter: Secondary | ICD-10-CM | POA: Diagnosis not present

## 2018-01-27 LAB — CBC WITH DIFFERENTIAL/PLATELET
BASOS ABS: 19 {cells}/uL (ref 0–200)
Basophils Relative: 0.5 %
EOS ABS: 38 {cells}/uL (ref 15–500)
Eosinophils Relative: 1 %
HCT: 31.6 % — ABNORMAL LOW (ref 35.0–45.0)
HEMOGLOBIN: 10.7 g/dL — AB (ref 11.7–15.5)
Lymphs Abs: 1273 cells/uL (ref 850–3900)
MCH: 31.7 pg (ref 27.0–33.0)
MCHC: 33.9 g/dL (ref 32.0–36.0)
MCV: 93.5 fL (ref 80.0–100.0)
MONOS PCT: 17.3 %
MPV: 10.4 fL (ref 7.5–12.5)
NEUTROS ABS: 1813 {cells}/uL (ref 1500–7800)
Neutrophils Relative %: 47.7 %
Platelets: 216 10*3/uL (ref 140–400)
RBC: 3.38 10*6/uL — ABNORMAL LOW (ref 3.80–5.10)
RDW: 13.4 % (ref 11.0–15.0)
Total Lymphocyte: 33.5 %
WBC mixed population: 657 cells/uL (ref 200–950)
WBC: 3.8 10*3/uL (ref 3.8–10.8)

## 2018-01-27 LAB — COMPLETE METABOLIC PANEL WITH GFR
AG Ratio: 1.6 (calc) (ref 1.0–2.5)
ALT: 11 U/L (ref 6–29)
AST: 29 U/L (ref 10–35)
Albumin: 4.2 g/dL (ref 3.6–5.1)
Alkaline phosphatase (APISO): 47 U/L (ref 33–130)
BUN: 12 mg/dL (ref 7–25)
CALCIUM: 9.2 mg/dL (ref 8.6–10.4)
CO2: 24 mmol/L (ref 20–32)
CREATININE: 0.74 mg/dL (ref 0.60–0.93)
Chloride: 106 mmol/L (ref 98–110)
GFR, EST NON AFRICAN AMERICAN: 78 mL/min/{1.73_m2} (ref >=60)
GFR, Est African American: 90 mL/min/{1.73_m2} (ref >=60)
GLOBULIN: 2.6 g/dL (ref 1.9–3.7)
GLUCOSE: 69 mg/dL (ref 65–99)
Potassium: 4.5 mmol/L (ref 3.5–5.3)
SODIUM: 139 mmol/L (ref 135–146)
Total Bilirubin: 0.6 mg/dL (ref 0.2–1.2)
Total Protein: 6.8 g/dL (ref 6.1–8.1)

## 2018-01-27 LAB — VITAMIN D 25 HYDROXY (VIT D DEFICIENCY, FRACTURES): Vit D, 25-Hydroxy: 45 ng/mL (ref 30–100)

## 2018-01-27 NOTE — Progress Notes (Signed)
Labs are stable. Vit D is normal.

## 2018-02-09 ENCOUNTER — Telehealth: Payer: Self-pay

## 2018-02-09 MED ORDER — FOLIC ACID 1 MG PO TABS: 1.0000 mg | ORAL_TABLET | Freq: Every day | ORAL | 4 refills | Status: AC

## 2018-02-09 NOTE — Telephone Encounter (Signed)
Refill request received via fax.   Last visit: 01/26/2018 Next visit: 04/27/2018  Okay to refill per Dr. Corliss Skains.

## 2018-03-08 DIAGNOSIS — G894 Chronic pain syndrome: Secondary | ICD-10-CM | POA: Insufficient documentation

## 2018-03-31 ENCOUNTER — Telehealth: Payer: Self-pay | Admitting: Rheumatology

## 2018-03-31 NOTE — Telephone Encounter (Signed)
Patient needs a refill on Sulfasalazine 500mg  and please inform pharmacy of Vitamin / Calcium patient was suppose to add. Patient cannot remember what doctor told her last visit. Patient uses .

## 2018-04-03 MED ORDER — SULFASALAZINE 500 MG PO TABS: ORAL_TABLET | ORAL | 0 refills | Status: DC

## 2018-04-03 NOTE — Telephone Encounter (Signed)
She should take calcium 1200 mg p.o. daily and vitamin D 1000 units daily.  He may be able to find a calcium with vitamin D

## 2018-04-03 NOTE — Telephone Encounter (Signed)
Last Visit: 01/26/18 Next Visit: 04/27/18 Labs: 01/26/18 Labs are stable  Okay to refill per Dr. Corliss Skains   Prescription for SSZ sent to the pharmacy.   Patient states that she was advised of a vitamin and possibly calcium she was advised to use at her last office visit. Please advise

## 2018-04-03 NOTE — Telephone Encounter (Signed)
Called pharmacy and advised them. They will send medication to patient.

## 2018-04-13 DIAGNOSIS — M47816 Spondylosis without myelopathy or radiculopathy, lumbar region: Secondary | ICD-10-CM | POA: Insufficient documentation

## 2018-04-17 NOTE — Progress Notes (Signed)
Office Visit Note  Patient: [name]             Date of Birth: 08-22-1941           MRN: [id number]             PCP: Georgann Housekeeper, MD Referring: Georgann Housekeeper, MD Visit Date: 04/27/2018 Occupation: @GUAROCC @    Subjective:  Pain in left hand and left foot   History of Present Illness: [name] is a 77 y.o. female with history of seropositive rheumatoid arthritis and osteoporosis.  She states she is doing quite well as regards to her rheumatoid arthritis.  She has been experiencing some pain in her left hand and her left foot.  Activities of Daily Living:  Patient reports morning stiffness for 0 minute.   Patient Reports nocturnal pain.  Difficulty dressing/grooming: Denies Difficulty climbing stairs: Reports Difficulty getting out of chair: Reports Difficulty using hands for taps, buttons, cutlery, and/or writing: Reports   Review of Systems  Constitutional: Positive for fatigue. Negative for night sweats, weight gain and weight loss.  HENT: Negative for mouth sores, trouble swallowing, trouble swallowing, mouth dryness and nose dryness.   Eyes: Negative for pain, redness, visual disturbance and dryness.  Respiratory: Negative for cough, shortness of breath and difficulty breathing.   Cardiovascular: Negative for chest pain, palpitations, hypertension, irregular heartbeat and swelling in legs/feet.  Gastrointestinal: Negative for blood in stool, constipation and diarrhea.  Endocrine: Negative for increased urination.  Genitourinary: Negative for vaginal dryness.  Musculoskeletal: Positive for arthralgias and joint pain. Negative for joint swelling, myalgias, muscle weakness, morning stiffness, muscle tenderness and myalgias.  Skin: Negative for color change, rash, hair loss, skin tightness, ulcers and sensitivity to sunlight.  Allergic/Immunologic: Negative for susceptible to infections.  Neurological: Negative for dizziness, memory loss, night sweats and  weakness.  Hematological: Negative for swollen glands.  Psychiatric/Behavioral: Positive for sleep disturbance. Negative for depressed mood. The patient is not nervous/anxious.     PMFS History:  Patient Active Problem List   Diagnosis Date Noted  . Rheumatoid arthritis (HCC) 09/20/2017  . Language barrier 06/09/2017  . History of Pulmonary nodulosis  06/09/2017  . 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 High risk medication use 03/07/2017  . History of gastroesophageal reflux (GERD) 03/07/2017  . Kyphosis 03/07/2017  . Idiopathic scoliosis 03/07/2017  . Age-related osteoporosis without current pathological fracture 03/07/2017  . 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 Pulmonary nodules 03/07/2017  . Dermatitis 09/09/2016  . 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 Arthritis    RA, sees Dr.  09/28/2015  . Cataracts, bilateral    hx of  . Chronic back pain   . Complication of anesthesia   . GERD (gastroesophageal reflux disease)   . Headache(784.0)   . PONV (postoperative nausea and vomiting)     History reviewed. No pertinent family history. Past Surgical History:  Procedure Laterality Date  . EYE SURGERY     cataract bilateral  . FINGER ARTHRODESIS  06/06/2012   Procedure: ARTHRODESIS FINGER;  Surgeon: 08/06/2012, MD;  Location: [facility] OR;  Service: Orthopedics;  Laterality: Right;  RIGHT SMALL FINGER AND RING FINGER PIP FUSION WITH AUTOLOGOUS GRAFT FROM RIGHT WRIST  . FINGER ARTHROPLASTY Left 09/26/2015   Procedure: LEFT HAND METACARPOPHALANGEAL ARTHROPLASTY INDEX MIDDLE  RING AND SMALL FINGERS WITH REALIGNMENT OF ;  Surgeon: Dominica Severin, MD;  Location: [facility] OR;  Service: Orthopedics;  Laterality: Left;  . FINGER SURGERY Right 09/20/2017   screws in 4th and 5th digit    . FOOT SURGERY    . FRACTURE SURGERY     under right eye  . HAND SURGERY    . HARDWARE REMOVAL Right 09/20/2017   Procedure: Right hand hardware removal  ring and small fingers;  Surgeon: Dominica Severin, MD;  Location: [facility] OR;  Service: Orthopedics;  Laterality: Right;  60 mins  . PROXIMAL INTERPHALANGEAL FUSION (PIP) Left 04/22/2016   Procedure: PROXIMAL INTERPHALANGEAL FUSION (PIP) Index, middle, and ring;  Surgeon: Dominica Severin, MD;  Location: [facility] OR;  Service: Orthopedics;  Laterality: Left;  . REPAIR EXTENSOR TENDON Left 09/26/2015   Procedure: EXTENSOR TENDON;  Surgeon: Dominica Severin, MD;  Location: [facility] OR;  Service: Orthopedics;  Laterality: Left;   Social History   Social History Narrative  . Not on file     Objective: Vital Signs: BP 118/78 (BP Location: Left Arm, Patient Position: Sitting, Cuff Size: Small)   Pulse 66   Resp 12   Ht 4\' 2"  (1.27 m)   Wt 82 lb (37.2 kg)   BMI 23.06 kg/m    Physical Exam  Constitutional: She is oriented to person, place, and time. She appears well-developed and well-nourished.  HENT:  Head: Normocephalic and atraumatic.  Eyes: Conjunctivae and EOM are normal.  Neck: Normal range of motion.  Cardiovascular: Normal rate, regular rhythm, normal heart sounds and intact distal pulses.  Pulmonary/Chest: Effort normal and breath sounds normal.  Abdominal: Soft. Bowel sounds are normal.  Lymphadenopathy:    She has no cervical adenopathy.  Neurological: She is alert and oriented to person, place, and time.  Skin: Skin is warm and dry. Capillary refill takes less than 2 seconds.  Psychiatric: She has a normal mood and affect. Her behavior is normal.  Nursing note and vitals reviewed.    Musculoskeletal Exam: C-spine limited range of motion.  She has shoulder joint abduction limited to  30 degrees bilaterally.  She has no contractures in her elbows.  She has limited range of motion of her bilateral wrist joints.  Right wrist changes fused.   She had postsurgical changes in all her PIP joints of her hands.  No synovitis was noted.  She has tenderness over left second MCP joint.  Hip joints were difficult to assess in the sitting position knee joints with good range of motion.  She has severe osteoarthritic changes in her bilateral MTP joints with subluxation.  She had a callus under her left first toe.  CDAI Exam: CDAI Homunculus Exam:   Tenderness:  Left hand: 2nd MCP  Joint Counts:  CDAI Tender Joint count: 1 CDAI Swollen Joint count: 0  Global Assessments:  Patient Global Assessment: 5 Provider Global Assessment: 5  CDAI Calculated Score: 11    Investigation: No additional findings. CBC Latest Ref Rng & Units 01/26/2018 09/16/2017 06/16/2017  WBC 3.8 - 10.8 Thousand/uL 3.8 4.2 3.9  Hemoglobin 11.7 - 15.5 g/dL 10.7(L) 12.6 10.9(L)  Hematocrit 35.0 - 45.0 % 31.6(L) 37.4 33.6(L)  Platelets 140 - 400 Thousand/uL 216 177 243   CMP Latest Ref Rng & Units 01/26/2018 09/27/2017 09/16/2017  Glucose 65 - 99 mg/dL 69 - 78  BUN 7 - 25 mg/dL 12 - 13  Creatinine 09/18/2017 - 0.93 mg/dL 1.28 - 7.86  Sodium 7.67 - 146 mmol/L 139 -  135  Potassium 3.5 - 5.3 mmol/L 4.5 - 4.4  Chloride 98 - 110 mmol/L 106 - 103  CO2 20 - 32 mmol/L 24 - 24  Calcium 8.6 - 10.4 mg/dL 9.2 - 9.5  Total Protein 6.1 - 8.1 g/dL 6.8 7.2 -  Total Bilirubin 0.2 - 1.2 mg/dL 0.6 0.5 -  Alkaline Phos 33 - 130 U/L - - -  AST 10 - 35 U/L 29 30 -  ALT 6 - 29 U/L 11 15 -     Imaging: No results found.  Speciality Comments: No specialty comments available.    Procedures:  No procedures performed Allergies: Patient has no known allergies.   Assessment / Plan:     Visit Diagnoses: Rheumatoid arthritis involving both hands with positive rheumatoid factor (HCC)-she has severe end-stage rheumatoid arthritis without much synovitis.  She is tolerating her medications well.  She continues to have some arthralgias.  We plan to continue current treatment.  High risk  medication use - Methotrexate  4 tablets by mouth every week, folic acid 1mg  by mouth daily, SSZ  500 mg 1 tablet by outh twice a day - Plan: CBC with Differential/Platelet, COMPLETE METABOLIC PANEL WITH GFR today and then every 3 months.  Callus of foot-on her left first MTP.  Patient has an appointment coming up with podiatrist per patient.  Age-related osteoporosis without current pathological fracture - DEXA 2012 -3.9 T score of the distal radius . She supposed to be taking Fosamax. And is followed by Dr. Donette Larry.   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  Meds ordered this encounter  Medications  . methotrexate (RHEUMATREX) 2.5 MG tablet    Sig: Take 4 tablets (10 mg total) by mouth once a week. Saturday    Dispense:  48 tablet    Refill:  0  . sulfaSALAzine (AZULFIDINE) 500 MG tablet    Sig: TAKE 1 TABLET (500 MG TOTAL) BY MOUTH TWO TIMES DAILY.    Dispense:  180 tablet    Refill:  0    Face-to-face time spent with patient was  30 minutes.> 50% of time was spent in counseling and coordination of care.  Follow-Up Instructions: Return in about 3 months (around 07/28/2018) for Rheumatoid arthritis.   Pollyann Savoy, MD  Note - This record has been created using Animal nutritionist.  Chart creation errors have been sought, but may not always  have been located. Such creation errors do not reflect on  the standard of medical care.

## 2018-04-27 ENCOUNTER — Ambulatory Visit (INDEPENDENT_AMBULATORY_CARE_PROVIDER_SITE_OTHER): Payer: Medicare Other | Admitting: Rheumatology

## 2018-04-27 ENCOUNTER — Encounter: Payer: Self-pay | Admitting: Physician Assistant

## 2018-04-27 VITALS — BP 118/78 | HR 66 | Resp 12 | Ht <= 58 in | Wt 82.0 lb

## 2018-04-27 DIAGNOSIS — Z8739 Personal history of other diseases of the musculoskeletal system and connective tissue: Secondary | ICD-10-CM

## 2018-04-27 DIAGNOSIS — E559 Vitamin D deficiency, unspecified: Secondary | ICD-10-CM

## 2018-04-27 DIAGNOSIS — Z789 Other specified health status: Secondary | ICD-10-CM | POA: Diagnosis not present

## 2018-04-27 DIAGNOSIS — H35389 Toxic maculopathy, unspecified eye: Secondary | ICD-10-CM | POA: Diagnosis not present

## 2018-04-27 DIAGNOSIS — T372X5A Adverse effect of antimalarials and drugs acting on other blood protozoa, initial encounter: Secondary | ICD-10-CM

## 2018-04-27 DIAGNOSIS — L84 Corns and callosities: Secondary | ICD-10-CM

## 2018-04-27 DIAGNOSIS — M810 Age-related osteoporosis without current pathological fracture: Secondary | ICD-10-CM | POA: Diagnosis not present

## 2018-04-27 DIAGNOSIS — Z87898 Personal history of other specified conditions: Secondary | ICD-10-CM

## 2018-04-27 DIAGNOSIS — Z8719 Personal history of other diseases of the digestive system: Secondary | ICD-10-CM | POA: Diagnosis not present

## 2018-04-27 DIAGNOSIS — M05742 Rheumatoid arthritis with rheumatoid factor of left hand without organ or systems involvement: Secondary | ICD-10-CM | POA: Diagnosis not present

## 2018-04-27 DIAGNOSIS — M05741 Rheumatoid arthritis with rheumatoid factor of right hand without organ or systems involvement: Secondary | ICD-10-CM | POA: Diagnosis not present

## 2018-04-27 DIAGNOSIS — R7612 Nonspecific reaction to cell mediated immunity measurement of gamma interferon antigen response without active tuberculosis: Secondary | ICD-10-CM

## 2018-04-27 DIAGNOSIS — Z603 Acculturation difficulty: Secondary | ICD-10-CM

## 2018-04-27 DIAGNOSIS — Z79899 Other long term (current) drug therapy: Secondary | ICD-10-CM | POA: Diagnosis not present

## 2018-04-27 LAB — CBC WITH DIFFERENTIAL/PLATELET
Basophils Absolute: 9 cells/uL (ref 0–200)
Basophils Relative: 0.2 %
EOS ABS: 40 {cells}/uL (ref 15–500)
Eosinophils Relative: 0.9 %
HEMATOCRIT: 34.7 % — AB (ref 35.0–45.0)
Hemoglobin: 11.5 g/dL — ABNORMAL LOW (ref 11.7–15.5)
LYMPHS ABS: 1294 {cells}/uL (ref 850–3900)
MCH: 32.1 pg (ref 27.0–33.0)
MCHC: 33.1 g/dL (ref 32.0–36.0)
MCV: 96.9 fL (ref 80.0–100.0)
MPV: 11.4 fL (ref 7.5–12.5)
Monocytes Relative: 16.2 %
NEUTROS PCT: 53.3 %
Neutro Abs: 2345 cells/uL (ref 1500–7800)
PLATELETS: 131 10*3/uL — AB (ref 140–400)
RBC: 3.58 10*6/uL — ABNORMAL LOW (ref 3.80–5.10)
RDW: 13.4 % (ref 11.0–15.0)
TOTAL LYMPHOCYTE: 29.4 %
WBC: 4.4 10*3/uL (ref 3.8–10.8)
WBCMIX: 713 {cells}/uL (ref 200–950)

## 2018-04-27 LAB — COMPLETE METABOLIC PANEL WITH GFR
AG RATIO: 1.5 (calc) (ref 1.0–2.5)
ALT: 12 U/L (ref 6–29)
AST: 28 U/L (ref 10–35)
Albumin: 4.3 g/dL (ref 3.6–5.1)
Alkaline phosphatase (APISO): 41 U/L (ref 33–130)
BILIRUBIN TOTAL: 0.8 mg/dL (ref 0.2–1.2)
BUN: 15 mg/dL (ref 7–25)
CALCIUM: 9.1 mg/dL (ref 8.6–10.4)
CHLORIDE: 101 mmol/L (ref 98–110)
CO2: 25 mmol/L (ref 20–32)
Creat: 0.79 mg/dL (ref 0.60–0.93)
GFR, Est African American: 83 mL/min/{1.73_m2} (ref >=60)
GFR, Est Non African American: 71 mL/min/{1.73_m2} (ref >=60)
GLOBULIN: 2.9 g/dL (ref 1.9–3.7)
Glucose, Bld: 70 mg/dL (ref 65–99)
POTASSIUM: 4.5 mmol/L (ref 3.5–5.3)
SODIUM: 136 mmol/L (ref 135–146)
Total Protein: 7.2 g/dL (ref 6.1–8.1)

## 2018-04-27 MED ORDER — SULFASALAZINE 500 MG PO TABS: ORAL_TABLET | ORAL | 0 refills | Status: DC

## 2018-04-27 MED ORDER — METHOTREXATE 2.5 MG PO TABS: 10.0000 mg | ORAL_TABLET | ORAL | 0 refills | Status: DC

## 2018-04-27 NOTE — Patient Instructions (Signed)
Standing Labs We placed an order today for your standing lab work.    Please come back and get your standing labs in 3 months   We have open lab Monday through Friday from 8:30-11:30 AM and 1:30-4:00 PM  at the office of Dr. Pollyann Savoy.   You may experience shorter wait times on Monday and Friday afternoons. The office is located at [address], [address], [place], [place] [address] No appointment is necessary.   Labs are drawn by [facility].  You may receive a bill from [facility] for your lab work. If you have any questions regarding directions or hours of operation,  please call [phone].

## 2018-04-27 NOTE — Addendum Note (Signed)
Addended by: Pollyann Savoy on: 04/27/2018 02:29 PM   Modules accepted: Level of Service

## 2018-04-28 NOTE — Progress Notes (Signed)
Labs are stable.

## 2018-05-03 ENCOUNTER — Telehealth: Payer: Self-pay | Admitting: Rheumatology

## 2018-05-03 NOTE — Telephone Encounter (Signed)
Left message to advise patient lab results are stable.  

## 2018-05-03 NOTE — Telephone Encounter (Signed)
Patient called stating she was returning your call regarding her lab results. °

## 2018-07-10 ENCOUNTER — Ambulatory Visit (INDEPENDENT_AMBULATORY_CARE_PROVIDER_SITE_OTHER): Payer: Medicare Other

## 2018-07-10 ENCOUNTER — Encounter: Payer: Self-pay | Admitting: Podiatry

## 2018-07-10 ENCOUNTER — Ambulatory Visit (INDEPENDENT_AMBULATORY_CARE_PROVIDER_SITE_OTHER): Payer: Medicare Other | Admitting: Podiatry

## 2018-07-10 ENCOUNTER — Other Ambulatory Visit: Payer: Self-pay | Admitting: Podiatry

## 2018-07-10 VITALS — BP 149/97 | HR 94 | Resp 16

## 2018-07-10 DIAGNOSIS — M778 Other enthesopathies, not elsewhere classified: Secondary | ICD-10-CM

## 2018-07-10 DIAGNOSIS — L989 Disorder of the skin and subcutaneous tissue, unspecified: Secondary | ICD-10-CM

## 2018-07-10 DIAGNOSIS — M2042 Other hammer toe(s) (acquired), left foot: Secondary | ICD-10-CM

## 2018-07-10 DIAGNOSIS — M779 Enthesopathy, unspecified: Secondary | ICD-10-CM

## 2018-07-12 NOTE — Progress Notes (Signed)
   Subjective: 77 year old female presenting today as a new patient with a chief complaint of burning pain to the plantar aspect of the left hallux that began about 8-9 months ago. She reports an associated callus lesion to the area. She also reports burning pain to the fourth right toe. Walking increases the pain. She has not done anything for treatment. Patient is here for further evaluation and treatment.   Past Medical History:  Diagnosis Date  . Arthritis    RA, sees Dr.  Ethelene Hal  . Cataracts, bilateral    hx of  . Chronic back pain   . Complication of anesthesia   . GERD (gastroesophageal reflux disease)   . Headache(784.0)   . PONV (postoperative nausea and vomiting)      Objective:  Physical Exam General: Alert and oriented x3 in no acute distress  Dermatology: Hyperkeratotic lesion present on the left hallux. Pain on palpation with a central nucleated core noted. Skin is warm, dry and supple bilateral lower extremities. Negative for open lesions or macerations.  Vascular: Palpable pedal pulses bilaterally. No edema or erythema noted. Capillary refill within normal limits.  Neurological: Epicritic and protective threshold grossly intact bilaterally.   Musculoskeletal Exam: Pain on palpation at the keratotic lesion noted as well as to the bilateral midfoot. Range of motion within normal limits bilateral. Muscle strength 5/5 in all groups bilateral.  Radiographic Exam: DJD noted throughout pedal joints of bilateral feet.   Assessment: 1. Midfoot DJD/capsulitis bilateral  2. Porokeratosis left hallux  3. RA   Plan of Care:  1. Patient evaluated. X-Rays reviewed.  2. Excisional debridement of keratoic lesion using a chisel blade was performed without incident.  3. Dressed area with light dressing. 4. Injection of 0.5 mLs Celestone Soluspan injected into the bilateral midfoot.  5. Recommended good shoe gear.  6. Continue taking Methotrexate from PCP.  7. Patient is to  return to the clinic in 6 weeks.   Felecia Shelling, DPM [facility]  Dr. Felecia Shelling, DPM    [address]                                        [place], [place] [address]                Office [phone]  Fax [phone]

## 2018-07-18 NOTE — Progress Notes (Deleted)
Office Visit Note  Patient: [name]             Date of Birth: 12/15/1940           MRN: [id number]             PCP: Georgann Housekeeper, MD Referring: Georgann Housekeeper, MD Visit Date: 08/01/2018 Occupation: @GUAROCC @  Subjective:  No chief complaint on file.   History of Present Illness: [name] is a 77 y.o. female ***   Activities of Daily Living:  Patient reports morning stiffness for *** {minute/hour:19697}.   Patient {ACTIONS;DENIES/REPORTS:21021675::"Denies"} nocturnal pain.  Difficulty dressing/grooming: {ACTIONS;DENIES/REPORTS:21021675::"Denies"} Difficulty climbing stairs: {ACTIONS;DENIES/REPORTS:21021675::"Denies"} Difficulty getting out of chair: {ACTIONS;DENIES/REPORTS:21021675::"Denies"} Difficulty using hands for taps, buttons, cutlery, and/or writing: {ACTIONS;DENIES/REPORTS:21021675::"Denies"}  No Rheumatology ROS completed.   PMFS History:  Patient Active Problem List   Diagnosis Date Noted  . Lumbar spondylosis 04/13/2018  . Chronic pain syndrome 03/08/2018  . Degeneration of lumbosacral intervertebral disc 11/28/2017  . Rheumatoid arthritis (HCC) 09/20/2017  . Language barrier 06/09/2017  . History of Pulmonary nodulosis  06/09/2017  . 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 High risk medication use 03/07/2017  . History of gastroesophageal reflux (GERD) 03/07/2017  . Kyphosis 03/07/2017  . Idiopathic scoliosis 03/07/2017  . Age-related osteoporosis without current pathological fracture 03/07/2017  . 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 Pulmonary nodules 03/07/2017  . Dermatitis 09/09/2016  . 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 Arthritis    RA, sees Dr.  09/28/2015  . Cataracts, bilateral    hx of  . Chronic back pain     . Complication of anesthesia   . GERD (gastroesophageal reflux disease)   . Headache(784.0)   . PONV (postoperative nausea and vomiting)     No family history on file. Past Surgical History:  Procedure Laterality Date  . EYE SURGERY     cataract bilateral  . FINGER ARTHRODESIS  06/06/2012   Procedure: ARTHRODESIS FINGER;  Surgeon: 08/06/2012, MD;  Location: [facility] OR;  Service: Orthopedics;  Laterality: Right;  RIGHT SMALL FINGER AND RING FINGER PIP FUSION WITH AUTOLOGOUS GRAFT FROM RIGHT WRIST  . FINGER ARTHROPLASTY Left 09/26/2015   Procedure: LEFT HAND METACARPOPHALANGEAL ARTHROPLASTY INDEX MIDDLE RING AND SMALL FINGERS WITH REALIGNMENT OF ;  Surgeon: 09/28/2015, MD;  Location: [facility] OR;  Service: Orthopedics;  Laterality: Left;  . FINGER SURGERY Right 09/20/2017   screws in 4th and 5th digit  . FOOT SURGERY    . FRACTURE SURGERY     under right eye  . HAND SURGERY    . HARDWARE REMOVAL Right 09/20/2017   Procedure: Right hand hardware removal  ring and small fingers;  Surgeon: 09/22/2017, MD;  Location: [facility] OR;  Service: Orthopedics;  Laterality: Right;  60 mins  . PROXIMAL INTERPHALANGEAL FUSION (PIP) Left 04/22/2016   Procedure: PROXIMAL INTERPHALANGEAL FUSION (PIP) Index, middle, and ring;  Surgeon: 04/24/2016, MD;  Location: [facility] OR;  Service: Orthopedics;  Laterality: Left;  . REPAIR EXTENSOR TENDON Left 09/26/2015   Procedure: EXTENSOR TENDON;  Surgeon: 09/28/2015, MD;  Location: [facility] OR;  Service: Orthopedics;  Laterality: Left;   Social History   Social History Narrative  . Not on file    Objective: Vital Signs: There were no vitals taken for this visit.  Physical Exam   Musculoskeletal Exam: ***  CDAI Exam: CDAI Score: Not documented Patient Global Assessment: Not documented; Provider Global Assessment: Not documented Swollen: Not documented; Tender: Not documented Joint Exam   Not documented   There is currently no information documented on the  homunculus. Go to the Rheumatology activity and complete the homunculus joint exam.  Investigation: No additional findings.  Imaging: Dg Foot Complete Left  Result Date: 07/10/2018 Please see detailed radiograph report in office note.   Recent Labs: Lab Results  Component Value Date   WBC 4.4 04/27/2018   HGB 11.5 (L) 04/27/2018   PLT 131 (L) 04/27/2018   NA 136 04/27/2018   K 4.5 04/27/2018   CL 101 04/27/2018   CO2 25 04/27/2018   GLUCOSE 70 04/27/2018   BUN 15 04/27/2018   CREATININE 0.79 04/27/2018   BILITOT 0.8 04/27/2018   ALKPHOS 60 06/16/2017   AST 28 04/27/2018   ALT 12 04/27/2018   PROT 7.2 04/27/2018   ALBUMIN 4.3 06/16/2017   CALCIUM 9.1 04/27/2018   GFRAA 83 04/27/2018    Speciality Comments: No specialty comments available.  Procedures:  No procedures performed Allergies: Patient has no known allergies.   Assessment / Plan:     Visit Diagnoses: No diagnosis found.   Orders: No orders of the defined types were placed in this encounter.  No orders of the defined types were placed in this encounter.   Face-to-face time spent with patient was *** minutes. Greater than 50% of time was spent in counseling and coordination of care.  Follow-Up Instructions: No follow-ups on file.   Ellen Henri, CMA  Note - This record has been created using Animal nutritionist.  Chart creation errors have been sought, but may not always  have been located. Such creation errors do not reflect on  the standard of medical care.

## 2018-07-25 ENCOUNTER — Telehealth: Payer: Self-pay | Admitting: Rheumatology

## 2018-07-25 NOTE — Telephone Encounter (Signed)
Patient called stating she was returning a call to the office.   

## 2018-07-26 NOTE — Telephone Encounter (Signed)
Attempted to contact the patient and left message for patient to call the office.  

## 2018-07-27 ENCOUNTER — Telehealth: Payer: Self-pay | Admitting: *Deleted

## 2018-07-27 MED ORDER — METHOTREXATE 2.5 MG PO TABS: 10.0000 mg | ORAL_TABLET | ORAL | 0 refills | Status: DC

## 2018-07-27 MED ORDER — SULFASALAZINE 500 MG PO TABS: ORAL_TABLET | ORAL | 0 refills | Status: DC

## 2018-07-27 NOTE — Telephone Encounter (Signed)
Patient called stating she is out of medicine.  Last Visit: 04/27/18 Next Visit: 08/01/18  Labs: 04/27/18 stable  Okay to refill per Dr. Corliss Skains

## 2018-07-31 NOTE — Progress Notes (Signed)
Office Visit Note  Patient: [name]             Date of Birth: Nov 26, 1940           MRN: [id number]             PCP: Georgann Housekeeper, MD Referring: Georgann Housekeeper, MD Visit Date: 08/03/2018 Occupation: @GUAROCC @  Subjective:  Right shoulder pain.   History of Present Illness: [name] is a 77 y.o. female history of rheumatoid arthritis and osteoporosis.  She reports increased discomfort in her right shoulder.  None of the other joints are swollen.  She has been having some discomfort in her right hip.  Change reports that she fell in the past not exactly when and her right shoulder and right hip has been hurting since then.  Activities of Daily Living:  Patient reports morning stiffness for 1 hour.   Patient Reports nocturnal pain.  Difficulty dressing/grooming: Reports Difficulty climbing stairs: Reports Difficulty getting out of chair: Reports Difficulty using hands for taps, buttons, cutlery, and/or writing: Reports  Review of Systems  Constitutional: Positive for fatigue. Negative for night sweats, weight gain and weight loss.  HENT: Negative for mouth sores, trouble swallowing, trouble swallowing, mouth dryness and nose dryness.   Eyes: Positive for dryness. Negative for pain, redness and visual disturbance.  Respiratory: Negative for cough, shortness of breath and difficulty breathing.   Cardiovascular: Negative for chest pain, palpitations, hypertension, irregular heartbeat and swelling in legs/feet.  Gastrointestinal: Negative for blood in stool, constipation and diarrhea.  Endocrine: Negative for increased urination.  Genitourinary: Negative for vaginal dryness.  Musculoskeletal: Positive for arthralgias, joint pain and morning stiffness. Negative for joint swelling, myalgias, muscle weakness, muscle tenderness and myalgias.  Skin: Negative for color change, rash, hair loss, skin tightness, ulcers and sensitivity to sunlight.  Allergic/Immunologic:  Negative for susceptible to infections.  Neurological: Negative for dizziness, memory loss, night sweats and weakness.  Hematological: Negative for swollen glands.  Psychiatric/Behavioral: Positive for sleep disturbance. Negative for depressed mood. The patient is not nervous/anxious.     PMFS History:  Patient Active Problem List   Diagnosis Date Noted  . Lumbar spondylosis 04/13/2018  . Chronic pain syndrome 03/08/2018  . Degeneration of lumbosacral intervertebral disc 11/28/2017  . Rheumatoid arthritis (HCC) 09/20/2017  . Language barrier 06/09/2017  . History of Pulmonary nodulosis  06/09/2017  . 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 High risk medication use 03/07/2017  . History of gastroesophageal reflux (GERD) 03/07/2017  . Kyphosis 03/07/2017  . Idiopathic scoliosis 03/07/2017  . Age-related osteoporosis without current pathological fracture 03/07/2017  . 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 Pulmonary nodules 03/07/2017  . Dermatitis 09/09/2016  . 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 Arthritis    RA, sees Dr.  09/28/2015  . Cataracts, bilateral    hx of  . Chronic back pain   . Complication of anesthesia   . GERD (gastroesophageal reflux disease)   . Headache(784.0)   . PONV (postoperative nausea and vomiting)     History reviewed. No pertinent family history. Past Surgical History:  Procedure Laterality Date  . EYE SURGERY     cataract bilateral  . FINGER ARTHRODESIS  06/06/2012   Procedure: ARTHRODESIS FINGER;  Surgeon: 08/06/2012, MD;  Location: [facility] OR;  Service: Orthopedics;  Laterality: Right;  RIGHT SMALL FINGER AND RING FINGER PIP FUSION WITH AUTOLOGOUS GRAFT FROM RIGHT WRIST  . FINGER ARTHROPLASTY Left 09/26/2015   Procedure: LEFT HAND  METACARPOPHALANGEAL ARTHROPLASTY INDEX MIDDLE RING AND SMALL FINGERS WITH REALIGNMENT OF ;  Surgeon: Dominica Severin, MD;  Location: [facility] OR;  Service: Orthopedics;  Laterality: Left;  . FINGER SURGERY Right 09/20/2017   screws in 4th and 5th digit  . FOOT SURGERY    . FRACTURE SURGERY     under right eye  . HAND SURGERY    . HARDWARE REMOVAL Right 09/20/2017   Procedure: Right hand hardware removal  ring and small fingers;  Surgeon: Dominica Severin, MD;  Location: [facility] OR;  Service: Orthopedics;  Laterality: Right;  60 mins  . PROXIMAL INTERPHALANGEAL FUSION (PIP) Left 04/22/2016   Procedure: PROXIMAL INTERPHALANGEAL FUSION (PIP) Index, middle, and ring;  Surgeon: Dominica Severin, MD;  Location: [facility] OR;  Service: Orthopedics;  Laterality: Left;  . REPAIR EXTENSOR TENDON Left 09/26/2015   Procedure: EXTENSOR TENDON;  Surgeon: Dominica Severin, MD;  Location: [facility] OR;  Service: Orthopedics;  Laterality: Left;   Social History   Social History Narrative  . Not on file    Objective: Vital Signs: BP 123/77 (BP Location: Left Arm, Patient Position: Sitting, Cuff Size: Normal)   Pulse 90   Resp 11   Ht 4\' 2"  (1.27 m)   Wt 80 lb (36.3 kg)   BMI 22.50 kg/m    Physical Exam  Constitutional: She is oriented to person, place, and time. She appears well-developed and well-nourished.  HENT:  Head: Normocephalic and atraumatic.  Eyes: Conjunctivae and EOM are normal.  Neck: Normal range of motion.  Cardiovascular: Normal rate, regular rhythm, normal heart sounds and intact distal pulses.  Pulmonary/Chest: Effort normal and breath sounds normal.  Abdominal: Soft. Bowel sounds are normal.  Lymphadenopathy:    She has no cervical adenopathy.  Neurological: She is alert and oriented to person, place, and time.  Skin: Skin is warm and dry. Capillary refill takes less than 2 seconds.  Psychiatric: She has a normal mood and affect. Her behavior is normal.  Nursing note and vitals reviewed.     Musculoskeletal Exam: C-spine limited range of motion.  She has severe kyphoscoliosis.  Right shoulder joint abduction was limited to 30 degrees.  Left shoulder joint was in good range of motion.  Elbow joints were in good range of motion.  She has some synovitis of her left second MCP joint.  Hip joints were this is difficult to assess in the chair.  Knee joints were in good range of motion.  Some MTP changes were noted.  CDAI Exam: CDAI Score: 4  Patient Global Assessment: 5 (mm); Provider Global Assessment: 5 (mm) Swollen: 1 ; Tender: 2  Joint Exam      Right  Left  Glenohumeral   Tender     MCP 2     Swollen Tender     Investigation: No additional findings.  Imaging: Dg Foot Complete Left  Result Date: 07/10/2018 Please see detailed radiograph report in office note.   Recent Labs: Lab Results  Component Value Date   WBC 4.4 04/27/2018   HGB 11.5 (L) 04/27/2018   PLT 131 (L) 04/27/2018   NA 136 04/27/2018   K 4.5 04/27/2018   CL 101 04/27/2018   CO2 25 04/27/2018   GLUCOSE 70 04/27/2018   BUN 15 04/27/2018   CREATININE 0.79 04/27/2018   BILITOT 0.8  04/27/2018   ALKPHOS 60 06/16/2017   AST 28 04/27/2018   ALT 12 04/27/2018   PROT 7.2 04/27/2018   ALBUMIN 4.3 06/16/2017   CALCIUM 9.1 04/27/2018   GFRAA 83 04/27/2018    Speciality Comments: No specialty comments available.  Procedures:  No procedures performed Allergies: Patient has no known allergies.   Assessment / Plan:     Visit Diagnoses: Rheumatoid arthritis involving both hands with positive rheumatoid factor (HCC) - she has severe end-stage rheumatoid arthritis.  She had some synovitis on examination of her left second MCP.  She states she had been doing well until her fall.  She has been having discomfort in her right shoulder and her left hand but has been swelling.  High risk medication use - Methotrexate  4 tablets by mouth every week, folic acid 1mg  by mouth daily, SSZ  500 mg 1 tablet by outh  twice a day -her labs have been stable.  Plan: CBC with Differential/Platelet, COMPLETE METABOLIC PANEL WITH GFR  Chronic right shoulder pain -she had has limited range of motion of her right shoulder.  Plan: XR Shoulder Right.  The x-ray was unremarkable and there was no evidence of fracture.  I offered MRI or CT scan but patient declined.  Age-related osteoporosis without current pathological fracture - DEXA 2012 -3.9 T score of the distal radius .  We called Dr. 2013 office today.  Patient had a bone density last year.  She is holding Fosamax per request of oral surgeon due to oral surgery.  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  History of scoliosis-severe scoliosis and kyphosis.  History of Pulmonary nodulosis   History of gastroesophageal reflux (GERD)  Language barrier   Orders: Orders Placed This Encounter  Procedures  . XR Shoulder Right  . CBC with Differential/Platelet  . COMPLETE METABOLIC PANEL WITH GFR   No orders of the defined types were placed in this encounter.   Face-to-face time spent with patient was 30 minutes. Greater than 50% of time was spent in counseling and coordination of care.  Follow-Up Instructions: Return for Rheumatoid arthritis.   Paulita Fujita, MD  Note - This record has been created using Pollyann Savoy.  Chart creation errors have been sought, but may not always  have been located. Such creation errors do not reflect on  the standard of medical care.

## 2018-08-01 ENCOUNTER — Ambulatory Visit: Payer: Medicare Other | Admitting: Rheumatology

## 2018-08-03 ENCOUNTER — Encounter (HOSPITAL_COMMUNITY): Payer: Self-pay | Admitting: *Deleted

## 2018-08-03 ENCOUNTER — Ambulatory Visit (INDEPENDENT_AMBULATORY_CARE_PROVIDER_SITE_OTHER): Payer: Self-pay

## 2018-08-03 ENCOUNTER — Encounter: Payer: Self-pay | Admitting: Rheumatology

## 2018-08-03 ENCOUNTER — Ambulatory Visit (INDEPENDENT_AMBULATORY_CARE_PROVIDER_SITE_OTHER): Payer: Medicare Other | Admitting: Rheumatology

## 2018-08-03 ENCOUNTER — Other Ambulatory Visit: Payer: Self-pay

## 2018-08-03 VITALS — BP 123/77 | HR 90 | Resp 11 | Ht <= 58 in | Wt 80.0 lb

## 2018-08-03 DIAGNOSIS — M25511 Pain in right shoulder: Secondary | ICD-10-CM | POA: Diagnosis not present

## 2018-08-03 DIAGNOSIS — Z789 Other specified health status: Secondary | ICD-10-CM

## 2018-08-03 DIAGNOSIS — Z87898 Personal history of other specified conditions: Secondary | ICD-10-CM

## 2018-08-03 DIAGNOSIS — Z8719 Personal history of other diseases of the digestive system: Secondary | ICD-10-CM

## 2018-08-03 DIAGNOSIS — Z8739 Personal history of other diseases of the musculoskeletal system and connective tissue: Secondary | ICD-10-CM

## 2018-08-03 DIAGNOSIS — L84 Corns and callosities: Secondary | ICD-10-CM

## 2018-08-03 DIAGNOSIS — H35389 Toxic maculopathy, unspecified eye: Secondary | ICD-10-CM

## 2018-08-03 DIAGNOSIS — M810 Age-related osteoporosis without current pathological fracture: Secondary | ICD-10-CM

## 2018-08-03 DIAGNOSIS — R7612 Nonspecific reaction to cell mediated immunity measurement of gamma interferon antigen response without active tuberculosis: Secondary | ICD-10-CM

## 2018-08-03 DIAGNOSIS — G8929 Other chronic pain: Secondary | ICD-10-CM

## 2018-08-03 DIAGNOSIS — T372X5A Adverse effect of antimalarials and drugs acting on other blood protozoa, initial encounter: Secondary | ICD-10-CM

## 2018-08-03 DIAGNOSIS — Z603 Acculturation difficulty: Secondary | ICD-10-CM

## 2018-08-03 DIAGNOSIS — Z79899 Other long term (current) drug therapy: Secondary | ICD-10-CM

## 2018-08-03 DIAGNOSIS — E559 Vitamin D deficiency, unspecified: Secondary | ICD-10-CM

## 2018-08-03 DIAGNOSIS — M05741 Rheumatoid arthritis with rheumatoid factor of right hand without organ or systems involvement: Secondary | ICD-10-CM

## 2018-08-03 DIAGNOSIS — M05742 Rheumatoid arthritis with rheumatoid factor of left hand without organ or systems involvement: Secondary | ICD-10-CM

## 2018-08-03 NOTE — Progress Notes (Addendum)
[name] denies chest pain, shortness of breath.  Patient reports that her only problem is RA.  [name] states that she is suppose to arrive at 0800, I informed her  That arrival time is 90.  Patient asked me to call her ride and give them the arrival time.  The number is [facility], I spoke to [employer] who  Spoke with transportation and updated arrival time. [name] said she doesn't have anyone to stay with her at home after surgery.  I spoke with Lelon Mast at  Dr Barbette Merino office and gave her the information.Marland Kitchen

## 2018-08-04 ENCOUNTER — Ambulatory Visit (HOSPITAL_COMMUNITY): Payer: Medicare Other | Admitting: Certified Registered Nurse Anesthetist

## 2018-08-04 ENCOUNTER — Encounter (HOSPITAL_COMMUNITY): Payer: Self-pay | Admitting: Urology

## 2018-08-04 ENCOUNTER — Observation Stay (HOSPITAL_COMMUNITY)
Admission: RE | Admit: 2018-08-04 | Discharge: 2018-08-06 | Disposition: A | Discharge status: Home / Self Care | Payer: Medicare Other | Source: Ambulatory Visit | Attending: Oral Surgery | Admitting: Oral Surgery

## 2018-08-04 ENCOUNTER — Encounter (HOSPITAL_COMMUNITY)
Admission: RE | Disposition: A | Discharge status: Home / Self Care | Payer: Self-pay | Source: Ambulatory Visit | Attending: Oral Surgery

## 2018-08-04 ENCOUNTER — Encounter (HOSPITAL_COMMUNITY): Payer: Medicare Other

## 2018-08-04 DIAGNOSIS — K219 Gastro-esophageal reflux disease without esophagitis: Secondary | ICD-10-CM | POA: Insufficient documentation

## 2018-08-04 DIAGNOSIS — M549 Dorsalgia, unspecified: Secondary | ICD-10-CM | POA: Diagnosis not present

## 2018-08-04 DIAGNOSIS — K0530 Chronic periodontitis, unspecified: Secondary | ICD-10-CM | POA: Insufficient documentation

## 2018-08-04 DIAGNOSIS — M069 Rheumatoid arthritis, unspecified: Secondary | ICD-10-CM | POA: Diagnosis not present

## 2018-08-04 DIAGNOSIS — T8859XA Other complications of anesthesia, initial encounter: Secondary | ICD-10-CM | POA: Diagnosis present

## 2018-08-04 DIAGNOSIS — M419 Scoliosis, unspecified: Secondary | ICD-10-CM | POA: Insufficient documentation

## 2018-08-04 DIAGNOSIS — I82442 Acute embolism and thrombosis of left tibial vein: Secondary | ICD-10-CM | POA: Insufficient documentation

## 2018-08-04 DIAGNOSIS — I82412 Acute embolism and thrombosis of left femoral vein: Secondary | ICD-10-CM | POA: Diagnosis not present

## 2018-08-04 DIAGNOSIS — K029 Dental caries, unspecified: Principal | ICD-10-CM | POA: Insufficient documentation

## 2018-08-04 DIAGNOSIS — G8929 Other chronic pain: Secondary | ICD-10-CM | POA: Insufficient documentation

## 2018-08-04 DIAGNOSIS — T4145XA Adverse effect of unspecified anesthetic, initial encounter: Secondary | ICD-10-CM | POA: Diagnosis present

## 2018-08-04 DIAGNOSIS — I739 Peripheral vascular disease, unspecified: Secondary | ICD-10-CM

## 2018-08-04 DIAGNOSIS — Z9889 Other specified postprocedural states: Secondary | ICD-10-CM

## 2018-08-04 DIAGNOSIS — M79662 Pain in left lower leg: Secondary | ICD-10-CM

## 2018-08-04 DIAGNOSIS — I82432 Acute embolism and thrombosis of left popliteal vein: Secondary | ICD-10-CM | POA: Diagnosis not present

## 2018-08-04 HISTORY — PX: MULTIPLE EXTRACTIONS WITH ALVEOLOPLASTY: SHX5342

## 2018-08-04 LAB — COMPLETE METABOLIC PANEL WITH GFR
AG RATIO: 1.4 (calc) (ref 1.0–2.5)
ALT: 9 U/L (ref 6–29)
AST: 27 U/L (ref 10–35)
Albumin: 4.4 g/dL (ref 3.6–5.1)
Alkaline phosphatase (APISO): 44 U/L (ref 33–130)
BILIRUBIN TOTAL: 0.6 mg/dL (ref 0.2–1.2)
BUN: 16 mg/dL (ref 7–25)
CALCIUM: 9.8 mg/dL (ref 8.6–10.4)
CO2: 26 mmol/L (ref 20–32)
Chloride: 98 mmol/L (ref 98–110)
Creat: 0.7 mg/dL (ref 0.60–0.93)
GFR, EST AFRICAN AMERICAN: 96 mL/min/{1.73_m2} (ref >=60)
GFR, Est Non African American: 82 mL/min/{1.73_m2} (ref >=60)
Globulin: 3.1 g/dL (calc) (ref 1.9–3.7)
Glucose, Bld: 74 mg/dL (ref 65–99)
POTASSIUM: 4.8 mmol/L (ref 3.5–5.3)
Sodium: 134 mmol/L — ABNORMAL LOW (ref 135–146)
TOTAL PROTEIN: 7.5 g/dL (ref 6.1–8.1)

## 2018-08-04 LAB — CBC WITH DIFFERENTIAL/PLATELET
Basophils Absolute: 10 cells/uL (ref 0–200)
Basophils Relative: 0.2 %
Eosinophils Absolute: 38 cells/uL (ref 15–500)
Eosinophils Relative: 0.8 %
HCT: 34.3 % — ABNORMAL LOW (ref 35.0–45.0)
Hemoglobin: 11.5 g/dL — ABNORMAL LOW (ref 11.7–15.5)
Lymphs Abs: 1051 cells/uL (ref 850–3900)
MCH: 31 pg (ref 27.0–33.0)
MCHC: 33.5 g/dL (ref 32.0–36.0)
MCV: 92.5 fL (ref 80.0–100.0)
MONOS PCT: 14.7 %
MPV: 10.5 fL (ref 7.5–12.5)
NEUTROS ABS: 2995 {cells}/uL (ref 1500–7800)
Neutrophils Relative %: 62.4 %
PLATELETS: 219 10*3/uL (ref 140–400)
RBC: 3.71 10*6/uL — AB (ref 3.80–5.10)
RDW: 12.7 % (ref 11.0–15.0)
TOTAL LYMPHOCYTE: 21.9 %
WBC mixed population: 706 cells/uL (ref 200–950)
WBC: 4.8 10*3/uL (ref 3.8–10.8)

## 2018-08-04 LAB — HEMOGLOBIN: HEMOGLOBIN: 11.2 g/dL — AB (ref 12.0–15.0)

## 2018-08-04 SURGERY — MULTIPLE EXTRACTION WITH ALVEOLOPLASTY
Anesthesia: General

## 2018-08-04 MED ORDER — MEPERIDINE HCL 50 MG/ML IJ SOLN: 6.2500 mg | INTRAMUSCULAR | Status: DC | PRN

## 2018-08-04 MED ORDER — ONDANSETRON HCL 4 MG/2ML IJ SOLN: 4.0000 mg | Freq: Four times a day (QID) | INTRAMUSCULAR | Status: DC | PRN

## 2018-08-04 MED ORDER — METOPROLOL TARTRATE 5 MG/5ML IV SOLN: 5.0000 mg | Freq: Four times a day (QID) | INTRAVENOUS | Status: DC | PRN

## 2018-08-04 MED ORDER — OXYCODONE HCL 5 MG PO TABS: 5.0000 mg | ORAL_TABLET | Freq: Once | ORAL | Status: DC | PRN

## 2018-08-04 MED ORDER — SIMETHICONE 80 MG PO CHEW: 40.0000 mg | CHEWABLE_TABLET | Freq: Four times a day (QID) | ORAL | Status: DC | PRN

## 2018-08-04 MED ORDER — PHENYLEPHRINE 40 MCG/ML (10ML) SYRINGE FOR IV PUSH (FOR BLOOD PRESSURE SUPPORT)
PREFILLED_SYRINGE | INTRAVENOUS | Status: DC | PRN
  Administered 2018-08-04 (×2): 80 ug via INTRAVENOUS
  Administered 2018-08-04: 120 ug via INTRAVENOUS

## 2018-08-04 MED ORDER — LIDOCAINE-EPINEPHRINE 2 %-1:100000 IJ SOLN
INTRAMUSCULAR | Status: DC | PRN
  Administered 2018-08-04: 20 mL via INTRADERMAL

## 2018-08-04 MED ORDER — CEFAZOLIN SODIUM-DEXTROSE 2-4 GM/100ML-% IV SOLN
2.0000 g | INTRAVENOUS | Status: AC
  Administered 2018-08-04: 2 g via INTRAVENOUS
  Filled 2018-08-04: qty 100

## 2018-08-04 MED ORDER — LIDOCAINE 2% (20 MG/ML) 5 ML SYRINGE
INTRAMUSCULAR | Status: AC
  Filled 2018-08-04: qty 5

## 2018-08-04 MED ORDER — KETOROLAC TROMETHAMINE 15 MG/ML IJ SOLN
INTRAMUSCULAR | Status: DC | PRN
  Administered 2018-08-04: 15 mg via INTRAVENOUS

## 2018-08-04 MED ORDER — ROCURONIUM BROMIDE 50 MG/5ML IV SOSY
PREFILLED_SYRINGE | INTRAVENOUS | Status: AC
  Filled 2018-08-04: qty 5

## 2018-08-04 MED ORDER — LIDOCAINE-EPINEPHRINE 2 %-1:100000 IJ SOLN
INTRAMUSCULAR | Status: AC
  Filled 2018-08-04: qty 1

## 2018-08-04 MED ORDER — DIPHENHYDRAMINE HCL 50 MG/ML IJ SOLN: 12.5000 mg | Freq: Four times a day (QID) | INTRAMUSCULAR | Status: DC | PRN

## 2018-08-04 MED ORDER — PROPOFOL 10 MG/ML IV BOLUS
INTRAVENOUS | Status: AC
  Filled 2018-08-04: qty 20

## 2018-08-04 MED ORDER — LACTATED RINGERS IV SOLN
INTRAVENOUS | Status: DC | PRN
  Administered 2018-08-04: 09:00:00 via INTRAVENOUS

## 2018-08-04 MED ORDER — PROMETHAZINE HCL 25 MG/ML IJ SOLN: 6.2500 mg | INTRAMUSCULAR | Status: DC | PRN

## 2018-08-04 MED ORDER — METHOTREXATE 2.5 MG PO TABS
10.0000 mg | ORAL_TABLET | ORAL | Status: DC
  Administered 2018-08-05: 10 mg via ORAL
  Filled 2018-08-04: qty 4

## 2018-08-04 MED ORDER — OXYMETAZOLINE HCL 0.05 % NA SOLN
NASAL | Status: AC
  Filled 2018-08-04: qty 15

## 2018-08-04 MED ORDER — TRAMADOL HCL 50 MG PO TABS
50.0000 mg | ORAL_TABLET | Freq: Four times a day (QID) | ORAL | Status: DC | PRN
  Administered 2018-08-05: 50 mg via ORAL
  Filled 2018-08-04: qty 1

## 2018-08-04 MED ORDER — FENTANYL CITRATE (PF) 250 MCG/5ML IJ SOLN
INTRAMUSCULAR | Status: AC
  Filled 2018-08-04: qty 5

## 2018-08-04 MED ORDER — ACETAMINOPHEN 500 MG PO TABS
1000.0000 mg | ORAL_TABLET | Freq: Four times a day (QID) | ORAL | Status: DC
  Administered 2018-08-04 – 2018-08-06 (×7): 1000 mg via ORAL
  Filled 2018-08-04 (×8): qty 2

## 2018-08-04 MED ORDER — DEXTROSE-NACL 5-0.45 % IV SOLN
INTRAVENOUS | Status: DC
  Administered 2018-08-04: 17:00:00 via INTRAVENOUS

## 2018-08-04 MED ORDER — DIPHENHYDRAMINE HCL 12.5 MG/5ML PO ELIX: 12.5000 mg | ORAL_SOLUTION | Freq: Four times a day (QID) | ORAL | Status: DC | PRN

## 2018-08-04 MED ORDER — KETOROLAC TROMETHAMINE 30 MG/ML IJ SOLN
INTRAMUSCULAR | Status: AC
  Filled 2018-08-04: qty 1

## 2018-08-04 MED ORDER — 0.9 % SODIUM CHLORIDE (POUR BTL) OPTIME
TOPICAL | Status: DC | PRN
  Administered 2018-08-04: 1000 mL

## 2018-08-04 MED ORDER — ONDANSETRON HCL 4 MG/2ML IJ SOLN
INTRAMUSCULAR | Status: AC
  Filled 2018-08-04: qty 2

## 2018-08-04 MED ORDER — ONDANSETRON 4 MG PO TBDP: 4.0000 mg | ORAL_TABLET | Freq: Four times a day (QID) | ORAL | Status: DC | PRN

## 2018-08-04 MED ORDER — FLEET ENEMA 7-19 GM/118ML RE ENEM: 1.0000 | ENEMA | Freq: Once | RECTAL | Status: DC | PRN

## 2018-08-04 MED ORDER — BISACODYL 5 MG PO TBEC: 5.0000 mg | DELAYED_RELEASE_TABLET | Freq: Every day | ORAL | Status: DC | PRN

## 2018-08-04 MED ORDER — PROPOFOL 10 MG/ML IV BOLUS
INTRAVENOUS | Status: DC | PRN
  Administered 2018-08-04: 50 mg via INTRAVENOUS
  Administered 2018-08-04: 40 mg via INTRAVENOUS
  Administered 2018-08-04: 100 mg via INTRAVENOUS

## 2018-08-04 MED ORDER — OXYCODONE HCL 5 MG/5ML PO SOLN: 5.0000 mg | Freq: Once | ORAL | Status: DC | PRN

## 2018-08-04 MED ORDER — SUCCINYLCHOLINE CHLORIDE 200 MG/10ML IV SOSY
PREFILLED_SYRINGE | INTRAVENOUS | Status: AC
  Filled 2018-08-04: qty 10

## 2018-08-04 MED ORDER — SENNOSIDES-DOCUSATE SODIUM 8.6-50 MG PO TABS: 1.0000 | ORAL_TABLET | Freq: Every evening | ORAL | Status: DC | PRN

## 2018-08-04 MED ORDER — FENTANYL CITRATE (PF) 100 MCG/2ML IJ SOLN
INTRAMUSCULAR | Status: DC | PRN
  Administered 2018-08-04 (×2): 50 ug via INTRAVENOUS

## 2018-08-04 MED ORDER — FENTANYL CITRATE (PF) 100 MCG/2ML IJ SOLN: 25.0000 ug | INTRAMUSCULAR | Status: DC | PRN

## 2018-08-04 MED ORDER — SULFASALAZINE 500 MG PO TABS
500.0000 mg | ORAL_TABLET | Freq: Two times a day (BID) | ORAL | Status: DC
  Administered 2018-08-04 – 2018-08-06 (×4): 500 mg via ORAL
  Filled 2018-08-04 (×5): qty 1

## 2018-08-04 MED ORDER — PHENYLEPHRINE 40 MCG/ML (10ML) SYRINGE FOR IV PUSH (FOR BLOOD PRESSURE SUPPORT)
PREFILLED_SYRINGE | INTRAVENOUS | Status: AC
  Filled 2018-08-04: qty 20

## 2018-08-04 MED ORDER — CALCIUM CARBONATE-VITAMIN D 500-200 MG-UNIT PO TABS
1.0000 | ORAL_TABLET | Freq: Two times a day (BID) | ORAL | Status: DC
  Administered 2018-08-04 – 2018-08-06 (×4): 1 via ORAL
  Filled 2018-08-04 (×4): qty 1

## 2018-08-04 MED ORDER — SUCCINYLCHOLINE CHLORIDE 20 MG/ML IJ SOLN
INTRAMUSCULAR | Status: DC | PRN
  Administered 2018-08-04: 100 mg via INTRAVENOUS

## 2018-08-04 MED ORDER — OXYCODONE HCL 5 MG PO TABS: 5.0000 mg | ORAL_TABLET | ORAL | Status: DC | PRN

## 2018-08-04 MED ORDER — MIDAZOLAM HCL 2 MG/2ML IJ SOLN: 0.5000 mg | Freq: Once | INTRAMUSCULAR | Status: DC | PRN

## 2018-08-04 MED ORDER — DEXAMETHASONE SODIUM PHOSPHATE 10 MG/ML IJ SOLN
INTRAMUSCULAR | Status: AC
  Filled 2018-08-04: qty 1

## 2018-08-04 MED ORDER — DEXAMETHASONE SODIUM PHOSPHATE 10 MG/ML IJ SOLN
INTRAMUSCULAR | Status: DC | PRN
  Administered 2018-08-04: 10 mg via INTRAVENOUS

## 2018-08-04 MED ORDER — HYDROMORPHONE HCL 1 MG/ML IJ SOLN: 0.5000 mg | INTRAMUSCULAR | Status: DC | PRN

## 2018-08-04 MED ORDER — ONDANSETRON HCL 4 MG/2ML IJ SOLN
INTRAMUSCULAR | Status: DC | PRN
  Administered 2018-08-04: 4 mg via INTRAVENOUS

## 2018-08-04 MED ORDER — EPHEDRINE 5 MG/ML INJ
INTRAVENOUS | Status: AC
  Filled 2018-08-04: qty 10

## 2018-08-04 MED ORDER — FOLIC ACID 1 MG PO TABS
1.0000 mg | ORAL_TABLET | Freq: Every day | ORAL | Status: DC
  Administered 2018-08-04 – 2018-08-06 (×3): 1 mg via ORAL
  Filled 2018-08-04 (×3): qty 1

## 2018-08-04 MED ORDER — LIDOCAINE 2% (20 MG/ML) 5 ML SYRINGE
INTRAMUSCULAR | Status: DC | PRN
  Administered 2018-08-04: 40 mg via INTRAVENOUS

## 2018-08-04 SURGICAL SUPPLY — 36 items
BUR CROSS CUT FISSURE 1.6 (BURR) ×2 IMPLANT
BUR CROSS CUT FISSURE 1.6MM (BURR) ×1
BUR EGG ELITE 4.0 (BURR) ×2 IMPLANT
BUR EGG ELITE 4.0MM (BURR) ×1
CANISTER SUCT 3000ML PPV (MISCELLANEOUS) ×3 IMPLANT
COVER SURGICAL LIGHT HANDLE (MISCELLANEOUS) ×3 IMPLANT
COVER WAND RF STERILE (DRAPES) ×3 IMPLANT
CRADLE DONUT ADULT HEAD (MISCELLANEOUS) ×3 IMPLANT
DECANTER SPIKE VIAL GLASS SM (MISCELLANEOUS) IMPLANT
DRAPE U-SHAPE 76X120 STRL (DRAPES) ×3 IMPLANT
GAUZE PACKING FOLDED 2  STR (GAUZE/BANDAGES/DRESSINGS) ×2
GAUZE PACKING FOLDED 2 STR (GAUZE/BANDAGES/DRESSINGS) ×1 IMPLANT
GLOVE BIO SURGEON STRL SZ 6.5 (GLOVE) IMPLANT
GLOVE BIO SURGEON STRL SZ7.5 (GLOVE) ×3 IMPLANT
GLOVE BIO SURGEONS STRL SZ 6.5 (GLOVE)
GLOVE BIOGEL PI IND STRL 6.5 (GLOVE) IMPLANT
GLOVE BIOGEL PI IND STRL 7.0 (GLOVE) IMPLANT
GLOVE BIOGEL PI INDICATOR 6.5 (GLOVE)
GLOVE BIOGEL PI INDICATOR 7.0 (GLOVE)
GOWN STRL REUS W/ TWL LRG LVL3 (GOWN DISPOSABLE) ×1 IMPLANT
GOWN STRL REUS W/ TWL XL LVL3 (GOWN DISPOSABLE) ×1 IMPLANT
GOWN STRL REUS W/TWL LRG LVL3 (GOWN DISPOSABLE) ×3
GOWN STRL REUS W/TWL XL LVL3 (GOWN DISPOSABLE) ×3
IV NS 1000ML (IV SOLUTION) ×3
IV NS 1000ML BAXH (IV SOLUTION) ×1 IMPLANT
KIT BASIN OR (CUSTOM PROCEDURE TRAY) ×3 IMPLANT
KIT TURNOVER KIT B (KITS) ×3 IMPLANT
NEEDLE 22X1 1/2 (OR ONLY) (NEEDLE) ×6 IMPLANT
NEEDLE 27GAX1X1/2 (NEEDLE) IMPLANT
NS IRRIG 1000ML POUR BTL (IV SOLUTION) ×3 IMPLANT
PAD ARMBOARD 7.5X6 YLW CONV (MISCELLANEOUS) ×3 IMPLANT
SUT CHROMIC 3 0 PS 2 (SUTURE) ×3 IMPLANT
SYR CONTROL 10ML LL (SYRINGE) ×3 IMPLANT
TRAY ENT MC OR (CUSTOM PROCEDURE TRAY) ×3 IMPLANT
TUBING IRRIGATION (MISCELLANEOUS) ×3 IMPLANT
YANKAUER SUCT BULB TIP NO VENT (SUCTIONS) ×3 IMPLANT

## 2018-08-04 NOTE — Anesthesia Postprocedure Evaluation (Signed)
Anesthesia Post Note  Patient: [name]  Procedure(s) Performed: MULTIPLE EXTRACTION (N/A )     Patient location during evaluation: PACU Anesthesia Type: General Level of consciousness: awake and alert, oriented and patient cooperative Pain management: pain level controlled Vital Signs Assessment: post-procedure vital signs reviewed and stable Respiratory status: spontaneous breathing, nonlabored ventilation and respiratory function stable Cardiovascular status: blood pressure returned to baseline and stable Postop Assessment: no apparent nausea or vomiting Anesthetic complications: no    Last Vitals:  Vitals:   08/04/18 1145 08/04/18 1215  BP: (!) 148/94 137/81  Pulse: 97 78  Resp: 15 (!) 21  Temp:    SpO2: 100% 100%    Last Pain:  Vitals:   08/04/18 1115  PainSc: 0-No pain                 Tarvares Lant,E. Auriel Kist

## 2018-08-04 NOTE — Anesthesia Preprocedure Evaluation (Addendum)
Anesthesia Evaluation  Patient identified by MRN, date of birth, ID band Patient awake    Reviewed: Allergy & Precautions, NPO status , Patient's Chart, lab work & pertinent test results  History of Anesthesia Complications (+) PONV  Airway Mallampati: I  TM Distance: >3 FB Neck ROM: Full    Dental  (+) Edentulous Upper, Poor Dentition, Dental Advisory Given   Pulmonary neg pulmonary ROS,    breath sounds clear to auscultation       Cardiovascular negative cardio ROS   Rhythm:Regular Rate:Normal     Neuro/Psych  Headaches, Chronic back pain scoliosis    GI/Hepatic negative GI ROS, Neg liver ROS,   Endo/Other  negative endocrine ROS  Renal/GU negative Renal ROS     Musculoskeletal  (+) Arthritis , Rheumatoid disorders,    Abdominal   Peds  Hematology negative hematology ROS (+)   Anesthesia Other Findings Pt c/o L calf tenderness, +[facility] Dr. Barbette Merino arranging medicine follow-up  Reproductive/Obstetrics                            Anesthesia Physical Anesthesia Plan  ASA: III  Anesthesia Plan: General   Post-op Pain Management:    Induction: Intravenous  PONV Risk Score and Plan: 4 or greater and Ondansetron, Dexamethasone and Treatment may vary due to age or medical condition  Airway Management Planned: Nasal ETT  Additional Equipment:   Intra-op Plan:   Post-operative Plan: Extubation in OR  Informed Consent: I have reviewed the patients History and Physical, chart, labs and discussed the procedure including the risks, benefits and alternatives for the proposed anesthesia with the patient or authorized representative who has indicated his/her understanding and acceptance.   Dental advisory given  Plan Discussed with: CRNA and Surgeon  Anesthesia Plan Comments: (Plan routine monitors, GETA Doppler and medicine follow-up for calf tenderness)        Anesthesia  Quick Evaluation

## 2018-08-04 NOTE — H&P (Signed)
HISTORY AND PHYSICAL  [name] is a 77 y.o. female patient referred by general dentist for removal remining teeth.  No diagnosis found.  Past Medical History:  Diagnosis Date  . Arthritis    RA, sees Dr.  Ethelene Hal  . Cataracts, bilateral    hx of  . Chronic back pain   . Complication of anesthesia   . GERD (gastroesophageal reflux disease)   . Headache(784.0)   . PONV (postoperative nausea and vomiting)     Current Facility-Administered Medications  Medication Dose Route Frequency Provider Last Rate Last Dose  . ceFAZolin (ANCEF) IVPB 2g/100 mL premix  2 g Intravenous On Call to OR Ocie Doyne, DDS       No Known Allergies Active Problems:   * No active hospital problems. *  Vitals: Blood pressure (!) 148/89, pulse 94, temperature 98.4 F (36.9 C), resp. rate 16, SpO2 93 %. Lab results: Results for orders placed or performed in visit on 08/03/18 (from the past 24 hour(s))  CBC with Differential/Platelet     Status: Abnormal   Collection Time: 08/03/18  3:17 PM  Result Value Ref Range   WBC 4.8 3.8 - 10.8 Thousand/uL   RBC 3.71 (L) 3.80 - 5.10 Million/uL   Hemoglobin 11.5 (L) 11.7 - 15.5 g/dL   HCT 49.6 (L) 75.9 - 16.3 %   MCV 92.5 80.0 - 100.0 fL   MCH 31.0 27.0 - 33.0 pg   MCHC 33.5 32.0 - 36.0 g/dL   RDW 84.6 65.9 - 93.5 %   Platelets 219 140 - 400 Thousand/uL   MPV 10.5 7.5 - 12.5 fL   Neutro Abs 2,995 1,500 - 7,800 cells/uL   Lymphs Abs 1,051 850 - 3,900 cells/uL   WBC mixed population 706 200 - 950 cells/uL   Eosinophils Absolute 38 15 - 500 cells/uL   Basophils Absolute 10 0 - 200 cells/uL   Neutrophils Relative % 62.4 %   Total Lymphocyte 21.9 %   Monocytes Relative 14.7 %   Eosinophils Relative 0.8 %   Basophils Relative 0.2 %  COMPLETE METABOLIC PANEL WITH GFR     Status: Abnormal   Collection Time: 08/03/18  3:17 PM  Result Value Ref Range   Glucose, Bld 74 65 - 99 mg/dL   BUN 16 7 - 25 mg/dL   Creat 7.01 7.79 - 3.90 mg/dL   GFR, Est Non  African American 82 > OR = 60 mL/min/1.31m2   GFR, Est African American 96 > OR = 60 mL/min/1.92m2   BUN/Creatinine Ratio NOT APPLICABLE 6 - 22 (calc)   Sodium 134 (L) 135 - 146 mmol/L   Potassium 4.8 3.5 - 5.3 mmol/L   Chloride 98 98 - 110 mmol/L   CO2 26 20 - 32 mmol/L   Calcium 9.8 8.6 - 10.4 mg/dL   Total Protein 7.5 6.1 - 8.1 g/dL   Albumin 4.4 3.6 - 5.1 g/dL   Globulin 3.1 1.9 - 3.7 g/dL (calc)   AG Ratio 1.4 1.0 - 2.5 (calc)   Total Bilirubin 0.6 0.2 - 1.2 mg/dL   Alkaline phosphatase (APISO) 44 33 - 130 U/L   AST 27 10 - 35 U/L   ALT 9 6 - 29 U/L   Radiology Results: Xr Shoulder Right  Result Date: 08/03/2018 Glenohumeral joint space narrowing was noted.  No chondrocalcinosis was noted.  No fracture was noted.  General appearance: alert, cooperative and cachectic Head: Normocephalic, without obvious abnormality, atraumatic Eyes: negative Nose: Nares normal. Septum midline.  Mucosa normal. No drainage or sinus tenderness. Throat: lower remaining anterior teeth with gross decay, periodontitial. Pharynx clear Neck: no adenopathy, supple, symmetrical, trachea midline and thyroid not enlarged, symmetric, no tenderness/mass/nodules Resp: clear to auscultation bilaterally Cardio: regular rate and rhythm, S1, S2 normal, no murmur, click, rub or gallop  Assessment: Non-restorable teeth secondary to dental caries  Plan: Removal carious teeth, alveoloplasty. GA. 24 h obs   Ocie Doyne 08/04/2018

## 2018-08-04 NOTE — Consult Note (Signed)
REASON FOR CONSULT:    Left calf pain  HPI:   [name] is a pleasant 77 y.o. female, who came in today for tooth extraction.  She was complaining of pain in her left calf and a venous duplex scan has been ordered.  Vascular surgery was consulted.  Of note the history is obtained through the translator.  The patient is 77 years old.  She lives independently.  She is ambulatory.  She tells me that she has had pain in her left posterior calf for 5 years.  She apparently has been seen before for a similar problem and was told to wear compression stockings.  However the compression stockings were too tight and she cannot wear these.  She describes pain in her posterior left calf which usually occurs when she has pressure on it.  She is unaware of any previous history of DVT.  She denies any history of claudication, rest pain, or nonhealing ulcers.  She does not have any significant risk factors for peripheral vascular disease.  She denies any history of diabetes, hypertension, hypercholesterolemia, family history of premature cardiovascular disease or tobacco use.  The patient does have a history of rheumatoid arthritis.  Past Medical History:  Diagnosis Date  . Arthritis    RA, sees Dr.  Ethelene Hal  . Cataracts, bilateral    hx of  . Chronic back pain   . Complication of anesthesia   . GERD (gastroesophageal reflux disease)   . Headache(784.0)   . PONV (postoperative nausea and vomiting)     History reviewed. No pertinent family history.  SOCIAL HISTORY: Social History   Socioeconomic History  . Marital status: Married    Spouse name: Not on file  . Number of children: Not on file  . Years of education: Not on file  . Highest education level: Not on file  Occupational History  . Not on file  Social Needs  . Financial resource strain: Not on file  . Food insecurity:    Worry: Not on file    Inability: Not on file  . Transportation needs:    Medical: Not on file   Non-medical: Not on file  Tobacco Use  . Smoking status: Never Smoker  . Smokeless tobacco: Never Used  Substance and Sexual Activity  . Alcohol use: No  . Drug use: No  . Sexual activity: Not on file  Lifestyle  . Physical activity:    Days per week: Not on file    Minutes per session: Not on file  . Stress: Not on file  Relationships  . Social connections:    Talks on phone: Not on file    Gets together: Not on file    Attends religious service: Not on file    Active member of club or organization: Not on file    Attends meetings of clubs or organizations: Not on file    Relationship status: Not on file  . Intimate partner violence:    Fear of current or ex partner: Not on file    Emotionally abused: Not on file    Physically abused: Not on file    Forced sexual activity: Not on file  Other Topics Concern  . Not on file  Social History Narrative  . Not on file    No Known Allergies  Current Facility-Administered Medications  Medication Dose Route Frequency Provider Last Rate Last Dose  . fentaNYL (SUBLIMAZE) injection 25-50 mcg  25-50 mcg Intravenous Q5 min PRN Jean Rosenthal,  Rosalene Billings, MD      . meperidine (DEMEROL) injection 6.25-12.5 mg  6.25-12.5 mg Intravenous Q5 min PRN Jairo Ben, MD      . midazolam (VERSED) injection 0.5-2 mg  0.5-2 mg Intravenous Once PRN Jairo Ben, MD      . oxyCODONE (Oxy IR/ROXICODONE) immediate release tablet 5 mg  5 mg Oral Once PRN Jairo Ben, MD       Or  . oxyCODONE (ROXICODONE) 5 MG/5ML solution 5 mg  5 mg Oral Once PRN Jairo Ben, MD      . promethazine (PHENERGAN) injection 6.25-12.5 mg  6.25-12.5 mg Intravenous Q15 min PRN Jairo Ben, MD        REVIEW OF SYSTEMS:  [X]  denotes positive finding, [ ]  denotes negative finding Cardiac  Comments:  Chest pain or chest pressure:    Shortness of breath upon exertion: x   Short of breath when lying flat: x   Irregular heart rhythm:        Vascular      Pain in calf, thigh, or hip brought on by ambulation:    Pain in feet at night that wakes you up from your sleep:     Blood clot in your veins:    Leg swelling:         Pulmonary    Oxygen at home:    Productive cough:     Wheezing:         Neurologic    Sudden weakness in arms or legs:     Sudden numbness in arms or legs:     Sudden onset of difficulty speaking or slurred speech:    Temporary loss of vision in one eye:     Problems with dizziness:         Gastrointestinal    Blood in stool:     Vomited blood:         Genitourinary    Burning when urinating:     Blood in urine:        Psychiatric    Major depression:         Hematologic    Bleeding problems:    Problems with blood clotting too easily:        Skin    Rashes or ulcers:        Constitutional    Fever or chills:     PHYSICAL EXAM:   Vitals:   08/04/18 1100 08/04/18 1115 08/04/18 1130 08/04/18 1145  BP: (!) 143/80 132/80 (!) 144/90 (!) 148/94  Pulse: 88 86 89 97  Resp: 12 16 19 15   Temp:      SpO2: 100% 100% 100% 100%   GENERAL: The patient is a well-nourished female, in no acute distress. The vital signs are documented above. CARDIAC: There is a regular rate and rhythm.  VASCULAR: I do not detect carotid bruits. On the left side, which is the symptomatic side, there is a palpable femoral, popliteal, and posterior tibial pulse.  There is a biphasic lateral tarsal signal with the Doppler. On the right side she has a palpable femoral, popliteal, dorsalis pedis, and posterior tibial pulse. She has some hyperpigmentation of the posterior left calf and lateral distal left calf which looks like an area of previous phlebitis.  She has some hyperpigmentation. PULMONARY: There is good air exchange bilaterally without wheezing or rales. ABDOMEN: Soft and non-tender with normal pitched bowel sounds.  MUSCULOSKELETAL: There are no major deformities or cyanosis. NEUROLOGIC: No focal weakness or paresthesias  are detected. SKIN: There are no ulcers or rashes noted. PSYCHIATRIC: The patient has a normal affect.  DATA:    VENOUS DUPLEX: Her venous duplex scan is pending.   ASSESSMENT & PLAN:   CHRONIC VENOUS INSUFFICIENCY: Based on her exam she has evidence of hyperpigmentation and previous phlebitis in the posterior left calf.  I think this explains her symptoms.  Currently she does not have active phlebitis.  The symptoms been going on for 5 years.  She has palpable pulses in her feet with no evidence of arterial insufficiency.  We will obtain the venous duplex scan to rule out a DVT.  I have given her my card and I will be happy to see her back at any time if any new vascular issues arise.  We have discussed the importance of warm compresses, intermittent leg elevation and ibuprofen as needed for pain.   Waverly Ferrari [facility] [phone]

## 2018-08-04 NOTE — Op Note (Signed)
08/04/2018  9:57 AM  PATIENT:  [name]  77 y.o. female  PRE-OPERATIVE DIAGNOSIS:  non restorable teeth # 20, 21, 22, 23, 24, 25, 26, 27, 28, 29  POST-OPERATIVE DIAGNOSIS:  SAME  PROCEDURE:  Procedure(s): MULTIPLE EXTRACTION teeth # 20, 21, 22, 23, 24, 25, 26, 27, 28, 29, alveoloplasty right and left mandible  SURGEON:  Surgeon(s): Ocie Doyne, DDS  ANESTHESIA:   local and general  EBL:  minimal  DRAINS: none   SPECIMEN:  No Specimen  COUNTS:  YES  PLAN OF CARE:   PATIENT DISPOSITION:  PACU - hemodynamically stable.   PROCEDURE DETAILS:  Dictation #[id number]  Georgia Lopes, DMD 08/04/2018 9:57 AM

## 2018-08-04 NOTE — Transfer of Care (Signed)
Immediate Anesthesia Transfer of Care Note  Patient: [name]  Procedure(s) Performed: MULTIPLE EXTRACTION (N/A )  Patient Location: PACU  Anesthesia Type:General  Level of Consciousness: awake and oriented  Airway & Oxygen Therapy: Patient Spontanous Breathing  Post-op Assessment: Report given to RN and Post -op Vital signs reviewed and stable  Post vital signs: Reviewed and stable  Last Vitals:  Vitals Value Taken Time  BP 140/86 08/04/2018 10:16 AM  Temp    Pulse 102 08/04/2018 10:19 AM  Resp 17 08/04/2018 10:19 AM  SpO2 99 % 08/04/2018 10:19 AM  Vitals shown include unvalidated device data.  Last Pain:  Vitals:   08/04/18 0647  PainSc: 8       Patients Stated Pain Goal: 2 (08/04/18 5956)  Complications: No apparent anesthesia complications

## 2018-08-04 NOTE — Op Note (Signed)
NAMEMARGUERETTE, [name] MEDICAL RECORD [id number] ACCOUNT [id number] DATE OF BIRTH:[birth year] FACILITY: [facility] LOCATION: [facility] PHYSICIAN:Sandra Brents M. Kallen Mccrystal, DDS  OPERATIVE REPORT  DATE OF PROCEDURE:  08/04/2018  PREOPERATIVE DIAGNOSIS:  Nonrestorable teeth secondary to dental caries and periodontitis numbers 20, 21, 22, 23, 24, 25, 26, 27, 28, 29.  PROCEDURE:  Extraction of teeth numbers 20, 21, 22, 23, 24, 25, 26, 27, 28, 29, alveoplasty right and left mandible.  SURGEON:  Ocie Doyne, DDS  ANESTHESIA:  General nasal intubation, Dr. Jairo Ben attending.  DESCRIPTION OF PROCEDURE:  The patient was taken to the operating room and placed on the table in supine position.  General anesthesia was administered intravenously and a nasal endotracheal tube was placed.  The tube was secured, the eyes were  protected.  The patient was draped for surgery.  A timeout was performed.  The posterior pharynx was suctioned and a throat pack was placed, 2% lidocaine 1:100,000 epinephrine was infiltrated in an inferior alveolar block on the right and left side and  buccal infiltration of the anterior mandible.  A total of 10 mL was utilized.  A bite block was placed on the right side of the mouth.  Sweetheart retractor was used to retract the tongue.  A #15 blade was used to make an incision in the buccal and  gingival sulcus around teeth numbers 20 through 26 with a proximal crestal incision of around 1 cm on the left side adjacent to tooth 20.  The periosteum was reflected from around these teeth.  The teeth were elevated with a 301 elevator and removed from  the mouth with Universal forceps.  The sockets were curetted.  The periosteum was reflected to expose the alveolar crest.  The gingival and mucosal tissue was extremely friable and portions of the attached gingiva tore during the procedure.  After  reflection was complete, the egg-shaped bur and bone file were used to perform an alveoplasty on  the mandibular left.  Then, the area was irrigated, closed with 3-0 chromic.  The bite block and sweetheart were repositioned to the other side of the mouth.   The #15 blade was used to make an incision around teeth 27, 28, 29 with a proximal 1 cm incision on the alveolar crest.  The periosteum was reflected.  The teeth were elevated and removed with the dental forceps.  The periosteum was further reflected  to expose the alveolar crest and alveoplasty was performed again using the egg-shaped bur and bone file.  The area was irrigated and closed with 3-0 chromic.  The oral cavity was then irrigated and suctioned and the throat pack was removed.  The patient  was left in care of anesthesia for extubation and transport to recovery room with plans for overnight stay through 23-hour observation.  ESTIMATED BLOOD LOSS:  Minimal.  COMPLICATIONS:  None.  SPECIMENS:  None.  TN/NUANCE  D:08/04/2018 T:08/04/2018 JOB:[id number]

## 2018-08-04 NOTE — Anesthesia Procedure Notes (Signed)
Procedure Name: Intubation Performed by: Ezekiel Ina, CRNA Pre-anesthesia Checklist: Patient identified, Emergency Drugs available, Suction available and Patient being monitored Patient Re-evaluated:Patient Re-evaluated prior to induction Oxygen Delivery Method: Circle System Utilized Preoxygenation: Pre-oxygenation with 100% oxygen Induction Type: IV induction Ventilation: Mask ventilation without difficulty Nasal Tubes: Nasal Rae, Magill forceps- large, utilized and Nasal prep performed Tube size: 6.5 mm Number of attempts: 1 Placement Confirmation: ETT inserted through vocal cords under direct vision,  positive ETCO2 and breath sounds checked- equal and bilateral Secured at: 27 cm Tube secured with: Tape Dental Injury: Teeth and Oropharynx as per pre-operative assessment

## 2018-08-05 ENCOUNTER — Encounter (HOSPITAL_COMMUNITY): Payer: Self-pay | Admitting: Oral Surgery

## 2018-08-05 ENCOUNTER — Observation Stay (HOSPITAL_BASED_OUTPATIENT_CLINIC_OR_DEPARTMENT_OTHER): Payer: Medicare Other

## 2018-08-05 DIAGNOSIS — M79609 Pain in unspecified limb: Secondary | ICD-10-CM | POA: Diagnosis not present

## 2018-08-05 DIAGNOSIS — K029 Dental caries, unspecified: Secondary | ICD-10-CM | POA: Diagnosis not present

## 2018-08-05 MED ORDER — APIXABAN 5 MG PO TABS: 5.0000 mg | ORAL_TABLET | Freq: Two times a day (BID) | ORAL | Status: DC

## 2018-08-05 MED ORDER — ENOXAPARIN SODIUM 40 MG/0.4ML ~~LOC~~ SOLN: 35.0000 mg | SUBCUTANEOUS | Status: DC

## 2018-08-05 MED ORDER — CALCIUM CARBONATE-VITAMIN D 500-200 MG-UNIT PO TABS: 1.0000 | ORAL_TABLET | Freq: Two times a day (BID) | ORAL | 0 refills | Status: DC

## 2018-08-05 MED ORDER — OXYCODONE-ACETAMINOPHEN 5-325 MG PO TABS: 1.0000 | ORAL_TABLET | ORAL | 0 refills | Status: DC | PRN

## 2018-08-05 MED ORDER — APIXABAN 5 MG PO TABS
10.0000 mg | ORAL_TABLET | Freq: Once | ORAL | Status: AC
  Administered 2018-08-05: 10 mg via ORAL
  Filled 2018-08-05: qty 4

## 2018-08-05 MED ORDER — AMOXICILLIN 500 MG PO CAPS: 500.0000 mg | ORAL_CAPSULE | Freq: Three times a day (TID) | ORAL | 0 refills | Status: DC

## 2018-08-05 MED ORDER — APIXABAN 5 MG PO TABS
10.0000 mg | ORAL_TABLET | Freq: Two times a day (BID) | ORAL | Status: DC
  Administered 2018-08-05 – 2018-08-06 (×2): 10 mg via ORAL
  Filled 2018-08-05 (×2): qty 2

## 2018-08-05 NOTE — Progress Notes (Addendum)
ANTICOAGULATION CONSULT NOTE - Initial Consult  Pharmacy Consult:  Lovenox Indication:  Acute DVT  No Known Allergies  Patient Measurements: Height: 4\' 2"  (127 cm) Weight: 80 lb (36.3 kg) IBW/kg (Calculated) : 22.5  Vital Signs: Temp: 97.6 F (36.4 C) (10/05 0649) Temp Source: Oral (10/05 0649) BP: 116/77 (10/05 0649) Pulse Rate: 68 (10/05 0649)  Labs: Recent Labs    08/03/18 1517 08/04/18 0714  HGB 11.5* 11.2*  HCT 34.3*  --   PLT 219  --   CREATININE 0.70  --     Estimated Creatinine Clearance: 25.2 mL/min (by C-G formula based on SCr of 0.7 mg/dL).   Medical History: Past Medical History:  Diagnosis Date  . Arthritis    RA, sees Dr.  10/04/18  . Cataracts, bilateral    hx of  . Chronic back pain   . Complication of anesthesia   . GERD (gastroesophageal reflux disease)   . Headache(784.0)   . PONV (postoperative nausea and vomiting)     Assessment: 21 YOF admitted with teeth removal on 08/04/18.  Patient also c/o left calf pain and Doppler revealed acute DVT.  Pharmacy consulted to initiate Lovenox for treatment.  Renal function is stable, but given elderly with low body weight, calculated CrCL is less than 30 ml/min.  CBC and LFTs stable.   Goal of Therapy:  Anti-Xa level 0.6-1 units/ml 4hrs after LMWH dose given Monitor platelets by anticoagulation protocol: Yes    Plan:  Lovenox 35mg  SQ Q24H for VTE treatment CBC Q72H while on Lovenox Monitor for s/sx of bleeding   Envi Eagleson D. , PharmD, BCPS, BCCCP 08/05/2018, 12:32 PM    ======================  Addendum: Switch to Eliquis given elderly with low body weight and CrCL, putting her at high risk for bleeding   Plan: Eliquis 10mg  PO BID x 7 days, then on 08/12/18 start 5mg  PO BID Monitor for s/sx of bleeding Consider holding discharge to assess bleeding risk with Eliquis loading doses   Alexah Kivett D. , PharmD, BCPS, BCCCP 08/05/2018, 12:54 PM

## 2018-08-05 NOTE — Progress Notes (Signed)
Preliminary notes--Left lower extremity venous duplex exam completed. Positive for Acute DVT involving the left femoral vein, left popliteal vein, left posterior tibial vein, and left peroneal vein.  Hongying Jayro Mcmath (RDMS RVT) 08/05/18 11:36 AM

## 2018-08-05 NOTE — Progress Notes (Signed)
Day shift nurse handed off information to this nurse about patients meds needing to go to pharmacy. I spoke with patient and she relayed someone was going to get the day shift. This nurse put them in a bag in her closet.

## 2018-08-05 NOTE — Progress Notes (Addendum)
VASCULAR SURGERY  The patient's venous duplex scan shows an acute DVT of the left femoral, popliteal, and posterior tibial vein.  I would recommend 3 months of Xarelto.  I will arrange for a follow-up visit with me in 3 months with a follow-up duplex scan and we can decide if she will need to continue the Xarelto.  I have consulted the pharmacy for dosing of her Xarelto.  Waverly Ferrari, MD, FACS Beeper [phone] Office: [phone]  Addendum: Spoke with pharmacy. Given her small size, will use Eliquis instead of Xarelto. Pharmacy will dose. Would keep overnight given recent surgery to observe.

## 2018-08-05 NOTE — Plan of Care (Signed)
  Problem: Education: Goal: Knowledge of General Education information will improve Description: Including pain rating scale, medication(s)/side effects and non-pharmacologic comfort measures Outcome: Progressing   Problem: Clinical Measurements: Goal: Ability to maintain clinical measurements within normal limits will improve Outcome: Progressing   Problem: Pain Managment: Goal: General experience of comfort will improve Outcome: Progressing   

## 2018-08-05 NOTE — Progress Notes (Signed)
VASCULAR SURGERY:  Her left lower extremity venous duplex scan is pending.  If this is negative then certainly she could go home from my standpoint.  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  She has no evidence of arterial insufficiency.  Waverly Ferrari, MD, FACS Beeper [phone] Office: [phone]

## 2018-08-05 NOTE — Discharge Instructions (Signed)
Information on my medicine - ELIQUIS (apixaban)  This medication education was reviewed with me or my healthcare representative as part of my discharge preparation.    Why was Eliquis prescribed for you? Eliquis was prescribed to treat blood clots that may have been found in the veins of your legs (deep vein thrombosis) or in your lungs (pulmonary embolism) and to reduce the risk of them occurring again.  What do You need to know about Eliquis ? The starting dose is 10 mg (two 5 mg tablets) taken TWICE daily for the FIRST SEVEN (7) DAYS, then on (enter date)  08/12/18  the dose is reduced to ONE 5 mg tablet taken TWICE daily.  Eliquis may be taken with or without food.   Try to take the dose about the same time in the morning and in the evening. If you have difficulty swallowing the tablet whole please discuss with your pharmacist how to take the medication safely.  Take Eliquis exactly as prescribed and DO NOT stop taking Eliquis without talking to the doctor who prescribed the medication.  Stopping may increase your risk of developing a new blood clot.  Refill your prescription before you run out.  After discharge, you should have regular check-up appointments with your healthcare provider that is prescribing your Eliquis.    What do you do if you miss a dose? If a dose of ELIQUIS is not taken at the scheduled time, take it as soon as possible on the same day and twice-daily administration should be resumed. The dose should not be doubled to make up for a missed dose.  Important Safety Information A possible side effect of Eliquis is bleeding. You should call your healthcare provider right away if you experience any of the following: ? Bleeding from an injury or your nose that does not stop. ? Unusual colored urine (red or dark brown) or unusual colored stools (red or black). ? Unusual bruising for unknown reasons. ? A serious fall or if you hit your head (even if there is no  bleeding).  Some medicines may interact with Eliquis and might increase your risk of bleeding or clotting while on Eliquis. To help avoid this, consult your healthcare provider or pharmacist prior to using any new prescription or non-prescription medications, including herbals, vitamins, non-steroidal anti-inflammatory drugs (NSAIDs) and supplements.  This website has more information on Eliquis (apixaban): http://www.eliquis.com/eliquis/home

## 2018-08-05 NOTE — Discharge Summary (Signed)
Patient ID: [name] MRN: [id number] DOB/AGE: August 12, 1941 77 y.o.  Admit date: 08/04/2018 Discharge date: 08/05/2018  Admission Diagnoses: Post-operative state  Discharge Diagnoses:  Active Problems:   Complication of anesthesia   Post-operative state   Discharged Condition: good  Hospital Course: Admitted post-operatively after oral surgery procedure. Received IV fluids and pain medicine.   Consults: vascular surgery  Significant Diagnostic Studies: lower extremity  Venous duplex scan pending  Treatments: IV hydration and surgery  Discharge Exam: Blood pressure 116/77, pulse 68, temperature 97.6 F (36.4 C), temperature source Oral, resp. rate 16, height 4\' 2"  (1.27 m), weight 36.3 kg, SpO2 98 %. General appearance: alert and cooperative Head: Normocephalic, without obvious abnormality, atraumatic Eyes: negative Nose: Nares normal. Septum midline. Mucosa normal. No drainage or sinus tenderness. Throat: sutues intact. Mild oral edema. ecchymosis submandibular. pharynx clear Neck: no adenopathy, supple, symmetrical, trachea midline and thyroid not enlarged, symmetric, no tenderness/mass/nodules  Disposition: Discharged to home pending completion of venous duplex scan      Signed: 08/05/2018, 10:29 AM

## 2018-08-06 DIAGNOSIS — I82402 Acute embolism and thrombosis of unspecified deep veins of left lower extremity: Secondary | ICD-10-CM | POA: Diagnosis not present

## 2018-08-06 DIAGNOSIS — K029 Dental caries, unspecified: Secondary | ICD-10-CM | POA: Diagnosis not present

## 2018-08-06 MED ORDER — APIXABAN 5 MG PO TABS: 10.0000 mg | ORAL_TABLET | Freq: Two times a day (BID) | ORAL | 0 refills | Status: DC

## 2018-08-06 MED ORDER — APIXABAN 5 MG PO TABS: 5.0000 mg | ORAL_TABLET | Freq: Two times a day (BID) | ORAL | 2 refills | Status: DC

## 2018-08-06 NOTE — Progress Notes (Signed)
   VASCULAR SURGERY ASSESSMENT & PLAN:   LEFT FEMORAL AND POPLITEAL DVT: Patient's duplex yesterday was positive for a DVT involving the left femoral vein and popliteal vein.  I would recommend 3 months of Eliquis.  This was started yesterday.  I have printed her prescriptions and written instructions on how these are taken.  She was to be discharged yesterday by Dr. Barbette Merino, but her discharge was held because of her DVT.  I have gone ahead and written her discharge today.  My office will call her to arrange a follow-up venous duplex scan in 3 months.  She knows to call sooner if she has problems.  SUBJECTIVE:   No complaints this morning.  PHYSICAL EXAM:   Vitals:   08/05/18 1637 08/05/18 1657 08/05/18 2105 08/06/18 0501  BP: (!) 88/61 (!) 93/59 (!) 92/59 118/82  Pulse: (!) 102 89 88 85  Resp: 16  (!) 22 16  Temp: 98 F (36.7 C)  98.3 F (36.8 C) 98.2 F (36.8 C)  TempSrc: Oral  Oral Oral  SpO2: 100%  99% 100%  Weight:      Height:       No significant left lower extremity swelling.  LABS:   PROBLEM LIST:    Active Problems:   Complication of anesthesia   Post-operative state  CURRENT MEDS:   . acetaminophen  1,000 mg Oral Q6H  . apixaban  10 mg Oral BID   Followed by  . [START ON 08/12/2018] apixaban  5 mg Oral BID  . calcium-vitamin D  1 tablet Oral BID  . folic acid  1 mg Oral Daily  . methotrexate  10 mg Oral Weekly  . sulfaSALAzine  500 mg Oral BID   Waverly Ferrari Beeper: [phone] Office: [phone] 08/06/2018

## 2018-08-06 NOTE — Care Management (Signed)
Discussed Eliquis 30 day free card with patient and bedside RN.  Patient's preferred pharmacy is closed today.  Prescription printed and will have to take to another pharmacy ([facility] on [address] usually has Eliquis in stock).   Pt has Medicaid as a secondary, so copay should be less than $4.   Card left with prescriptions.

## 2018-08-06 NOTE — Progress Notes (Signed)
Patient given discharge instructions. Discharge instructions reviewed with patient and son, verbalized understanding. Patient left unit in stable condition via wheelchair with nursing staff.

## 2018-08-06 NOTE — Progress Notes (Signed)
[name] PROGRESS NOTE:   SUBJECTIVE: mild discomfort  OBJECTIVE:  Vitals: Blood pressure 118/82, pulse 85, temperature 98.2 F (36.8 C), temperature source Oral, resp. rate 16, height 4\' 2"  (1.27 m), weight 36.3 kg, SpO2 100 %. Lab results:No results found for this or any previous visit (from the past 24 hour(s)). Radiology Results: No results found. General appearance: alert, no distress and comfortable Head: Normocephalic, without obvious abnormality, atraumatic Eyes: negative Nose: Nares normal. Septum midline. Mucosa normal. No drainage or sinus tenderness. Throat: hemostatic, sutures intact, pharynx clear Neck: no adenopathy, supple, symmetrical, trachea midline, thyroid not enlarged, symmetric, no tenderness/mass/nodules and mild submental ecchymosis  ASSESSMENT: stable post-op  PLAN: D /C home toda.   08/06/2018

## 2018-08-07 ENCOUNTER — Telehealth: Payer: Self-pay | Admitting: Vascular Surgery

## 2018-08-07 NOTE — Telephone Encounter (Signed)
sch appr phone NA mld ltr 11/08/18 3pm LE DVT 345pm f/u MD

## 2018-08-08 ENCOUNTER — Other Ambulatory Visit: Payer: Self-pay

## 2018-08-08 DIAGNOSIS — I825Z2 Chronic embolism and thrombosis of unspecified deep veins of left distal lower extremity: Secondary | ICD-10-CM

## 2018-08-21 ENCOUNTER — Encounter: Payer: Self-pay | Admitting: Podiatry

## 2018-08-21 ENCOUNTER — Ambulatory Visit (INDEPENDENT_AMBULATORY_CARE_PROVIDER_SITE_OTHER): Payer: Medicare Other | Admitting: Podiatry

## 2018-08-21 DIAGNOSIS — L989 Disorder of the skin and subcutaneous tissue, unspecified: Secondary | ICD-10-CM | POA: Diagnosis not present

## 2018-08-21 DIAGNOSIS — M779 Enthesopathy, unspecified: Secondary | ICD-10-CM

## 2018-08-21 DIAGNOSIS — M778 Other enthesopathies, not elsewhere classified: Secondary | ICD-10-CM

## 2018-08-21 DIAGNOSIS — M722 Plantar fascial fibromatosis: Secondary | ICD-10-CM

## 2018-08-27 NOTE — Progress Notes (Signed)
   Subjective: 77 year old female presenting today for follow up evaluation of bilateral foot pain. She reports some continued pain and reports the callus on the left hallux is bothersome as well. She is still taking Methotrexate as directed by her PCP.  She reports a new complaint of ongoing pain to the left heel that began one year ago. She states she stepped on a nail and although she had it removed, she still experiences some pain and tenderness. Walking and standing increases her symptoms. Resting the foot helps alleviate them. Patient is here for further evaluation and treatment.   Past Medical History:  Diagnosis Date  . Arthritis    RA, sees Dr.  Ethelene Hal  . Cataracts, bilateral    hx of  . Chronic back pain   . Complication of anesthesia   . GERD (gastroesophageal reflux disease)   . Headache(784.0)   . PONV (postoperative nausea and vomiting)      Objective: Physical Exam General: The patient is alert and oriented x3 in no acute distress.  Dermatology: Hyperkeratotic lesions present on the bilateral great toes. Pain on palpation with a central nucleated core noted. Skin is warm, dry and supple bilateral lower extremities. Negative for open lesions or macerations bilateral.   Vascular: Dorsalis Pedis and Posterior Tibial pulses palpable bilateral.  Capillary fill time is immediate to all digits.  Neurological: Epicritic and protective threshold intact bilateral.   Musculoskeletal: Tenderness to palpation to the plantar aspect of the left heel along the plantar fascia. All other joints range of motion within normal limits bilateral. Strength 5/5 in all groups bilateral.   Assessment: 1. Plantar fasciitis left foot 2. Porokeratosis bilateral great toes  3. Rheumatoid arthritis   Plan of Care:  1. Patient evaluated.   2. Injection of 0.5cc Celestone soluspan injected into the left plantar fascia.  3. Excisional debridement of keratoic lesions using a chisel blade was  performed without incident. Light dressing applied.  4. Continue taking Methotrexate as directed by PCP.  5. Return to clinic as needed.     Felecia Shelling, DPM [facility]  Dr. Felecia Shelling, DPM    [address] [place], [place] [address]                Office [phone]  Fax [phone]

## 2018-11-02 NOTE — Progress Notes (Signed)
Office Visit Note  Patient: [name]             Date of Birth: August 11, 1941           MRN: [id number]             PCP: Georgann Housekeeper, MD Referring: Georgann Housekeeper, MD Visit Date: 11/16/2018 Occupation: @  Subjective: Left foot pain    History of Present Illness: [name] is a 78 y.o. female with history of seropositive rheumatoid arthritis and osteoporosis.  She is taking MTX 4 tablets by mouth once a week, folic acid 1 mg po daily, and SSZ 500 mg BID.  Patient states that she is overall doing well without any joint swelling.  She has been having some discomfort in her left second toe.  She states the toe is bent and it causes discomfort.  She has been tolerating her medications well.  Activities of Daily Living:  Patient reports morning stiffness for 0 minute.   Patient Denies nocturnal pain.  Difficulty dressing/grooming: Denies Difficulty climbing stairs: Reports Difficulty getting out of chair: Reports Difficulty using hands for taps, buttons, cutlery, and/or writing: Reports  Review of Systems  Constitutional: Negative for fatigue, night sweats, weight gain and weight loss.  HENT: Negative for mouth sores, trouble swallowing, trouble swallowing, mouth dryness and nose dryness.   Eyes: Negative for pain, redness, visual disturbance and dryness.       Vision loss  Respiratory: Negative for cough, shortness of breath and difficulty breathing.   Cardiovascular: Negative for chest pain, palpitations, hypertension, irregular heartbeat and swelling in legs/feet.  Gastrointestinal: Negative for blood in stool, constipation and diarrhea.  Endocrine: Negative for increased urination.  Genitourinary: Negative for vaginal dryness.  Musculoskeletal: Positive for arthralgias and joint pain. Negative for joint swelling, myalgias, muscle weakness, morning stiffness, muscle tenderness and myalgias.  Skin: Negative for color change, rash, hair loss, skin tightness,  ulcers and sensitivity to sunlight.  Allergic/Immunologic: Negative for susceptible to infections.  Neurological: Negative for dizziness, memory loss, night sweats and weakness.  Hematological: Negative for swollen glands.  Psychiatric/Behavioral: Negative for depressed mood and sleep disturbance. The patient is not nervous/anxious.     PMFS History:  Patient Active Problem List   Diagnosis Date Noted  . Post-operative state 08/04/2018  . Complication of anesthesia   . Lumbar spondylosis 04/13/2018  . Chronic pain syndrome 03/08/2018  . Degeneration of lumbosacral intervertebral disc 11/28/2017  . Rheumatoid arthritis (HCC) 09/20/2017  . Language barrier 06/09/2017  . History of Pulmonary nodulosis  06/09/2017  . 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 High risk medication use 03/07/2017  . History of gastroesophageal reflux (GERD) 03/07/2017  . Kyphosis 03/07/2017  . Idiopathic scoliosis 03/07/2017  . Age-related osteoporosis without current pathological fracture 03/07/2017  . 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 Pulmonary nodules 03/07/2017  . Dermatitis 09/09/2016  . 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 Arthritis    RA, sees Dr.  Ethelene Hal  . Cataracts, bilateral    hx of  . Chronic back pain   . Complication of anesthesia   . GERD (gastroesophageal reflux disease)   . Headache(784.0)   . PONV (postoperative nausea and vomiting)     History reviewed. No pertinent family history. Past Surgical History:  Procedure Laterality Date  .  EYE SURGERY     cataract bilateral  . FINGER ARTHRODESIS  06/06/2012   Procedure: ARTHRODESIS FINGER;  Surgeon: Dominica Severin, MD;  Location: [facility] OR;  Service: Orthopedics;  Laterality: Right;  RIGHT SMALL FINGER AND RING FINGER  PIP FUSION WITH AUTOLOGOUS GRAFT FROM RIGHT WRIST  . FINGER ARTHROPLASTY Left 09/26/2015   Procedure: LEFT HAND METACARPOPHALANGEAL ARTHROPLASTY INDEX MIDDLE RING AND SMALL FINGERS WITH REALIGNMENT OF ;  Surgeon: Dominica Severin, MD;  Location: [facility] OR;  Service: Orthopedics;  Laterality: Left;  . FINGER SURGERY Right 09/20/2017   screws in 4th and 5th digit  . FOOT SURGERY    . FRACTURE SURGERY     under right eye  . HAND SURGERY    . HARDWARE REMOVAL Right 09/20/2017   Procedure: Right hand hardware removal  ring and small fingers;  Surgeon: Dominica Severin, MD;  Location: [facility] OR;  Service: Orthopedics;  Laterality: Right;  60 mins  . MULTIPLE EXTRACTIONS WITH ALVEOLOPLASTY N/A 08/04/2018   Procedure: MULTIPLE EXTRACTION;  Surgeon: Ocie Doyne, DDS;  Location: [facility] OR;  Service: Oral Surgery;  Laterality: N/A;  . PROXIMAL INTERPHALANGEAL FUSION (PIP) Left 04/22/2016   Procedure: PROXIMAL INTERPHALANGEAL FUSION (PIP) Index, middle, and ring;  Surgeon: Dominica Severin, MD;  Location: [facility] OR;  Service: Orthopedics;  Laterality: Left;  . REPAIR EXTENSOR TENDON Left 09/26/2015   Procedure: EXTENSOR TENDON;  Surgeon: Dominica Severin, MD;  Location: [facility] OR;  Service: Orthopedics;  Laterality: Left;   Social History   Social History Narrative  . Not on file    Objective: Vital Signs: BP 110/68 (BP Location: Left Arm, Patient Position: Sitting, Cuff Size: Small)   Pulse 82   Resp 13   Ht  (1.346 m) Comment: when standing straight  Wt 83 lb (37.6 kg)   BMI 20.77 kg/m    Physical Exam Vitals signs and nursing note reviewed.  Constitutional:      Appearance: She is well-developed.  HENT:     Head: Normocephalic and atraumatic.  Eyes:     Conjunctiva/sclera: Conjunctivae normal.  Neck:     Musculoskeletal: Normal range of motion.  Cardiovascular:     Rate and Rhythm: Normal rate and regular rhythm.     Heart sounds: Normal heart sounds.  Pulmonary:     Effort: Pulmonary effort is  normal.     Breath sounds: Normal breath sounds.  Abdominal:     General: Bowel sounds are normal.     Palpations: Abdomen is soft.  Lymphadenopathy:     Cervical: No cervical adenopathy.  Skin:    General: Skin is warm and dry.     Capillary Refill: Capillary refill takes less than 2 seconds.  Neurological:     Mental Status: She is alert and oriented to person, place, and time.  Psychiatric:        Behavior: Behavior normal.      Musculoskeletal Exam: She has severe thoracic kyphosis or scoliosis.  Shoulder joints and elbow joints in good range of motion.  She has limited range of motion of her wrist joints.  She has thickening of MCP PIP and DIP joints.  She is some postsurgical changes in her hands.  Hip joints and knee joints with good range of motion.  She has some thickening over bilateral ankles.  She has arthritic changes in her MTPs.  No synovitis was noted.  CDAI Exam: CDAI Score: 0.4  Patient Global Assessment: 2 (mm); Provider Global Assessment: 2 (mm) Swollen: 0 ; Tender:  0  Joint Exam   Not documented   There is currently no information documented on the homunculus. Go to the Rheumatology activity and complete the homunculus joint exam.  Investigation: No additional findings.  Imaging: Vas [place] Lower Extremity Venous (dvt)  Result Date: 11/08/2018  Lower Venous Study Indications: Swelling, Edema, and Pain. Other Indications: Patient complains of severe pain on the medial and lateral                    aspects of the mid calf. Comparison Study: Prior duplex at an outside facility on 08/05/2018 suggested                   thrombus in the distal left femoral vein, popliteal vein,                   posterior tibial and peroneal veins. Performing Technologist: Hardie Lora RVT  Examination Guidelines: A complete evaluation includes B-mode imaging, spectral Doppler, color Doppler, and power Doppler as needed of all accessible portions of each vessel. Bilateral testing is  considered an integral part of a complete examination. Limited examinations for reoccurring indications may be performed as noted.  Right Venous Findings: +---+---------------+---------+-----------+----------+-------+    CompressibilityPhasicitySpontaneityPropertiesSummary +---+---------------+---------+-----------+----------+-------+ CFVFull           Yes      Yes                          +---+---------------+---------+-----------+----------+-------+  Left Venous Findings: +---------+---------------+---------+-----------+----------+-------+          CompressibilityPhasicitySpontaneityPropertiesSummary +---------+---------------+---------+-----------+----------+-------+ CFV      Full           Yes      Yes                          +---------+---------------+---------+-----------+----------+-------+ SFJ      Full                    Yes                          +---------+---------------+---------+-----------+----------+-------+ FV Prox  Full                    Yes                          +---------+---------------+---------+-----------+----------+-------+ FV Mid   Full           Yes      Yes                          +---------+---------------+---------+-----------+----------+-------+ FV DistalFull                    Yes                          +---------+---------------+---------+-----------+----------+-------+ PFV      Full                                                 +---------+---------------+---------+-----------+----------+-------+ POP      Full           Yes  Yes                          +---------+---------------+---------+-----------+----------+-------+ PTV      Full                    Yes                          +---------+---------------+---------+-----------+----------+-------+ PERO     Full                    Yes                          +---------+---------------+---------+-----------+----------+-------+ GSV       Full                    Yes                          +---------+---------------+---------+-----------+----------+-------+ SSV      Full                    Yes                          +---------+---------------+---------+-----------+----------+-------+ No evidence of DVT, SVT or Baker's cyst.  Left Technical Findings: The veins described as having DVT in the prior duplex (distal FV, Pop, PT, and per) were completely compressible at this time. The symptomatic area at the lateral aspect of the mid calf showed a network of compressible superficial veins. The symptomatic area on the lateral aspect of the mid calf showed no superficial thrombus.   Summary: Right: No evidence of common femoral vein obstruction. Left: No evidence of deep vein thrombosis in the lower extremity. No indirect evidence of obstruction proximal to the inguinal ligament. There is no evidence of deep vein thrombosis in the lower extremity. There is no evidence of superficial venous thrombosis. No cystic structure found in the popliteal fossa.  *See table(s) above for measurements and observations. Electronically signed by Waverly Ferrarihristopher Dickson MD on 11/08/2018 at 3:43:00 PM.    Final     Recent Labs: Lab Results  Component Value Date   WBC 4.8 08/03/2018   HGB 11.2 (L) 08/04/2018   PLT 219 08/03/2018   NA 134 (L) 08/03/2018   K 4.8 08/03/2018   CL 98 08/03/2018   CO2 26 08/03/2018   GLUCOSE 74 08/03/2018   BUN 16 08/03/2018   CREATININE 0.70 08/03/2018   BILITOT 0.6 08/03/2018   ALKPHOS 60 06/16/2017   AST 27 08/03/2018   ALT 9 08/03/2018   PROT 7.5 08/03/2018   ALBUMIN 4.3 06/16/2017   CALCIUM 9.8 08/03/2018   GFRAA 96 08/03/2018    Speciality Comments: No specialty comments available.  Procedures:  No procedures performed Allergies: Patient has no known allergies.   Assessment / Plan:     Visit Diagnoses: Rheumatoid arthritis involving both hands with positive rheumatoid factor (HCC) - she has severe  end-stage rheumatoid arthritis.  Patient has no synovitis on examination.  She has been tolerating medications well.  Left foot pain patient has no synovitis.  She has been experiencing some discomfort due to calluses and hammertoe.  I have advised her to schedule appointment with an ophthalmologist.  Per her request a prescription for Voltaren gel was given.  Side effects were reviewed.  High risk medication use -Methotrexate 4 tablets weekly, sulfasalazine 500 mg twice daily, along with folic acid 1 mg daily.  Most recent CBC/CMP stable on 08/03/2018.  Due for CBC/CMP today and will monitor every 3 months.  Standing orders are in place. Recommend annual influenza, Pneumovax 23, Prevnar 13, and Shingrix as indicated.  - Plan: CBC with Differential/Platelet, COMPLETE METABOLIC PANEL WITH GFR today and then every 3 months.  Age-related osteoporosis without current pathological fracture - Fosamax. Dr. 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 CBC with Differential/Platelet  . COMPLETE METABOLIC PANEL WITH GFR   Meds ordered this encounter  Medications  . diclofenac sodium (VOLTAREN) 1 % GEL    Sig: 3 grams to 3 large joints up to 3 times daily    Dispense:  3 Tube    Refill:  3      Follow-Up Instructions: Return in about 3 months (around 02/15/2019) for Rheumatoid arthritis, Osteoporosis.   Pollyann SavoyShaili Jillianna Stanek, MD  Note - This record has been created using Animal nutritionistDragon software.  Chart creation errors have been sought, but may not always  have been located. Such creation errors do not reflect on  the standard of medical care.

## 2018-11-08 ENCOUNTER — Other Ambulatory Visit: Payer: Self-pay

## 2018-11-08 ENCOUNTER — Encounter: Payer: Self-pay | Admitting: Vascular Surgery

## 2018-11-08 ENCOUNTER — Ambulatory Visit (INDEPENDENT_AMBULATORY_CARE_PROVIDER_SITE_OTHER): Payer: Medicare Other | Admitting: Vascular Surgery

## 2018-11-08 ENCOUNTER — Ambulatory Visit (HOSPITAL_COMMUNITY)
Admission: RE | Admit: 2018-11-08 | Discharge: 2018-11-08 | Disposition: A | Discharge status: Home / Self Care | Payer: Medicare Other | Source: Ambulatory Visit | Attending: Internal Medicine | Admitting: Internal Medicine

## 2018-11-08 VITALS — BP 130/80 | HR 90 | Temp 97.7°F | Resp 18 | Ht <= 58 in | Wt 80.0 lb

## 2018-11-08 DIAGNOSIS — I825Z2 Chronic embolism and thrombosis of unspecified deep veins of left distal lower extremity: Secondary | ICD-10-CM | POA: Insufficient documentation

## 2018-11-08 NOTE — Progress Notes (Signed)
Patient name: [name] MRN: [id number] DOB: 1941/04/17 Sex: female  REASON FOR VISIT:   Follow-up of left lower extremity DVT.  HPI:   [name] is a pleasant 78 y.o. female who I saw in consultation on 08/04/2018 with left calf pain.  She had actually come into the hospital for a tooth extraction and postoperatively was complaining of left calf pain.  He did have a history of chronic venous insufficiency and the history obtained from the translator was that she had had pain in that leg for 5 years.  I did obtain a venous duplex scan in the recovery room and this suggested an acute DVT involving the left femoral vein left popliteal vein and left posterior tibial vein.  Based on these findings, I recommended 3 months of a DOAC.  I I also recommended a follow-up duplex scan.  Today she is here in the office and there is no Nurse, learning disability.  However she does not appear to be having significant pain in either leg.  She does note that the left leg does have some darkish discoloration which is chronic.  She has been taking Eliquis.  Current Outpatient Medications  Medication Sig Dispense Refill  . alendronate (FOSAMAX) 70 MG tablet Take 70 mg by mouth once a week. Take with a full glass of water on an empty stomach.    Marland Kitchen apixaban (ELIQUIS) 5 MG TABS tablet Take 1 tablet (5 mg total) by mouth 2 (two) times daily. 60 tablet 2  . Calcium Carbonate-Vitamin D (CALCIUM 600+D) 600-400 MG-UNIT tablet Take 1 tablet by mouth 2 (two) times daily.    . calcium-vitamin D (OSCAL WITH D) 500-200 MG-UNIT tablet Take 1 tablet by mouth 2 (two) times daily. 60 tablet 0  . cycloSPORINE (RESTASIS) 0.05 % ophthalmic emulsion Place 1 drop into both eyes 2 (two) times daily.    . folic acid (FOLVITE) 1 MG tablet Take 1 tablet (1 mg total) by mouth daily. 90 tablet 4  . methotrexate 2.5 MG tablet Take 10 mg by mouth once a week.    Marland Kitchen oxyCODONE-acetaminophen (PERCOCET) 5-325 MG tablet Take 1 tablet by mouth  every 4 (four) hours as needed. 20 tablet 0  . sulfaSALAzine (AZULFIDINE) 500 MG tablet TAKE 1 TABLET (500 MG TOTAL) BY MOUTH TWO TIMES DAILY. (Patient taking differently: Take 500 mg by mouth 2 (two) times daily. ) 180 tablet 0  . Vitamin D, Ergocalciferol, (DRISDOL) 50000 units CAPS capsule TAKE ONE CAPSULE BY MOUTH PER MONTH (Patient taking differently: Take 50,000 Units by mouth every Saturday. ) 6 capsule 0  . traMADol (ULTRAM) 50 MG tablet Take 50 mg by mouth 3 (three) times daily as needed (pain).      No current facility-administered medications for this visit.     REVIEW OF SYSTEMS:  [X]  denotes positive finding, [ ]  denotes negative finding Vascular    Leg swelling    Cardiac    Chest pain or chest pressure:    Shortness of breath upon exertion:    Short of breath when lying flat:    Irregular heart rhythm:    Constitutional    Fever or chills:     PHYSICAL EXAM:   Vitals:   11/08/18 1509  BP: 130/80  Pulse: 90  Resp: 18  Temp: 97.7 F (36.5 C)  SpO2: 95%  Weight: 80 lb (36.3 kg)  Height: 4\' 2"  (1.27 m)    GENERAL: The patient is a well-nourished female, in no acute distress.  The vital signs are documented above. CARDIOVASCULAR: There is a regular rate and rhythm. PULMONARY: There is good air exchange bilaterally without wheezing or rales. Patient currently does not have any significant bilateral lower extremity swelling. She does have hyperpigmentation bilaterally consistent with chronic venous insufficiency.  DATA:   VENOUS DUPLEX: I have independently interpreted her venous duplex scan today.  On the left side there is no evidence of DVT.  The common femoral vein, femoral vein, popliteal vein and tibial veins are all fully compressible.  MEDICAL ISSUES:   HISTORY OF LEFT LOWER EXTREMITY DVT: This patient was diagnosed with an acute DVT in the recovery room in October 2019.  I recommended 3 months of Xarelto.  She has completed her Eliquis and duplex scan  today shows complete resolution of her clot.  I have explained to her that she can stop taking the Eliquis at this point.  We have discussed the importance of intermittent leg elevation the proper positioning for this.  I will see her back as needed.  Waverly Ferrarihristopher Dickson [facility] [phone]

## 2018-11-13 ENCOUNTER — Other Ambulatory Visit (HOSPITAL_COMMUNITY): Payer: Self-pay | Admitting: Internal Medicine

## 2018-11-13 ENCOUNTER — Other Ambulatory Visit: Payer: Self-pay | Admitting: Internal Medicine

## 2018-11-13 DIAGNOSIS — I824Y2 Acute embolism and thrombosis of unspecified deep veins of left proximal lower extremity: Secondary | ICD-10-CM

## 2018-11-13 DIAGNOSIS — Z1231 Encounter for screening mammogram for malignant neoplasm of breast: Secondary | ICD-10-CM

## 2018-11-16 ENCOUNTER — Encounter: Payer: Self-pay | Admitting: Rheumatology

## 2018-11-16 ENCOUNTER — Ambulatory Visit (INDEPENDENT_AMBULATORY_CARE_PROVIDER_SITE_OTHER): Payer: Medicare Other | Admitting: Rheumatology

## 2018-11-16 VITALS — BP 110/68 | HR 82 | Resp 13 | Ht <= 58 in | Wt 83.0 lb

## 2018-11-16 DIAGNOSIS — T372X5A Adverse effect of antimalarials and drugs acting on other blood protozoa, initial encounter: Secondary | ICD-10-CM

## 2018-11-16 DIAGNOSIS — R7612 Nonspecific reaction to cell mediated immunity measurement of gamma interferon antigen response without active tuberculosis: Secondary | ICD-10-CM | POA: Diagnosis not present

## 2018-11-16 DIAGNOSIS — Z79899 Other long term (current) drug therapy: Secondary | ICD-10-CM | POA: Diagnosis not present

## 2018-11-16 DIAGNOSIS — H35389 Toxic maculopathy, unspecified eye: Secondary | ICD-10-CM

## 2018-11-16 DIAGNOSIS — Z8719 Personal history of other diseases of the digestive system: Secondary | ICD-10-CM

## 2018-11-16 DIAGNOSIS — Z8739 Personal history of other diseases of the musculoskeletal system and connective tissue: Secondary | ICD-10-CM

## 2018-11-16 DIAGNOSIS — M05741 Rheumatoid arthritis with rheumatoid factor of right hand without organ or systems involvement: Secondary | ICD-10-CM | POA: Diagnosis not present

## 2018-11-16 DIAGNOSIS — M810 Age-related osteoporosis without current pathological fracture: Secondary | ICD-10-CM | POA: Diagnosis not present

## 2018-11-16 DIAGNOSIS — Z789 Other specified health status: Secondary | ICD-10-CM

## 2018-11-16 DIAGNOSIS — Z87898 Personal history of other specified conditions: Secondary | ICD-10-CM

## 2018-11-16 DIAGNOSIS — E559 Vitamin D deficiency, unspecified: Secondary | ICD-10-CM

## 2018-11-16 DIAGNOSIS — M05742 Rheumatoid arthritis with rheumatoid factor of left hand without organ or systems involvement: Secondary | ICD-10-CM

## 2018-11-16 LAB — CBC WITH DIFFERENTIAL/PLATELET
Absolute Monocytes: 642 cells/uL (ref 200–950)
BASOS ABS: 22 {cells}/uL (ref 0–200)
Basophils Relative: 0.5 %
EOS PCT: 0.9 %
Eosinophils Absolute: 40 cells/uL (ref 15–500)
HEMATOCRIT: 32.7 % — AB (ref 35.0–45.0)
HEMOGLOBIN: 10.9 g/dL — AB (ref 11.7–15.5)
LYMPHS ABS: 1439 {cells}/uL (ref 850–3900)
MCH: 30.8 pg (ref 27.0–33.0)
MCHC: 33.3 g/dL (ref 32.0–36.0)
MCV: 92.4 fL (ref 80.0–100.0)
MONOS PCT: 14.6 %
MPV: 10.6 fL (ref 7.5–12.5)
NEUTROS ABS: 2257 {cells}/uL (ref 1500–7800)
Neutrophils Relative %: 51.3 %
Platelets: 205 10*3/uL (ref 140–400)
RBC: 3.54 10*6/uL — AB (ref 3.80–5.10)
RDW: 13.7 % (ref 11.0–15.0)
Total Lymphocyte: 32.7 %
WBC: 4.4 10*3/uL (ref 3.8–10.8)

## 2018-11-16 LAB — COMPLETE METABOLIC PANEL WITH GFR
AG Ratio: 1.4 (calc) (ref 1.0–2.5)
ALBUMIN MSPROF: 4.4 g/dL (ref 3.6–5.1)
ALKALINE PHOSPHATASE (APISO): 45 U/L (ref 33–130)
ALT: 10 U/L (ref 6–29)
AST: 26 U/L (ref 10–35)
BUN: 17 mg/dL (ref 7–25)
CALCIUM: 10.6 mg/dL — AB (ref 8.6–10.4)
CO2: 29 mmol/L (ref 20–32)
CREATININE: 0.72 mg/dL (ref 0.60–0.93)
Chloride: 105 mmol/L (ref 98–110)
GFR, EST NON AFRICAN AMERICAN: 80 mL/min/{1.73_m2} (ref >=60)
GFR, Est African American: 92 mL/min/{1.73_m2} (ref >=60)
GLUCOSE: 72 mg/dL (ref 65–99)
Globulin: 3.1 g/dL (calc) (ref 1.9–3.7)
Potassium: 4.7 mmol/L (ref 3.5–5.3)
Sodium: 141 mmol/L (ref 135–146)
Total Bilirubin: 0.7 mg/dL (ref 0.2–1.2)
Total Protein: 7.5 g/dL (ref 6.1–8.1)

## 2018-11-16 MED ORDER — DICLOFENAC SODIUM 1 % TD GEL: TRANSDERMAL | 3 refills | Status: DC

## 2018-11-16 NOTE — Patient Instructions (Addendum)
Standing Labs We placed an order today for your standing lab work.    Please come back and get your standing labs in April and then every 3 months.  We have open lab Monday through Friday from 8:30-11:30 AM and 1:30-4:00 PM  at the office of Dr. Pollyann Savoy.   You may experience shorter wait times on Monday and Friday afternoons. The office is located at [address], [place], [place] [address] No appointment is necessary.   Labs are drawn by [facility].  You may receive a bill from [facility] for your lab work.  If you wish to have your labs drawn at another location, please call the office 24 hours in advance to send orders.  If you have any questions regarding directions or hours of operation,  please call [phone].   Just as a reminder please drink plenty of water prior to coming for your lab work. Thanks!   Vaccines You are taking a medication(s) that can suppress your immune system.  The following immunizations are recommended: . Flu annually . Pneumonia (Pneumovax 23 and Prevnar 13 spaced at least 1 year apart) . Shingrix  Please check with your PCP to make sure you are up to date.

## 2018-11-17 ENCOUNTER — Telehealth: Payer: Self-pay | Admitting: Rheumatology

## 2018-11-17 NOTE — Telephone Encounter (Signed)
Patient's son advised patient is mild anemia. Advised copy sent to PCP.

## 2018-11-17 NOTE — Progress Notes (Signed)
Mild anemia. Fax results to her PCP.

## 2018-11-17 NOTE — Telephone Encounter (Signed)
Patient's son left a voicemail requesting a return call from Dr. Corliss Skains.

## 2018-11-28 ENCOUNTER — Telehealth: Payer: Self-pay | Admitting: Rheumatology

## 2018-11-28 MED ORDER — SULFASALAZINE 500 MG PO TABS: ORAL_TABLET | ORAL | 0 refills | Status: DC

## 2018-11-28 MED ORDER — METHOTREXATE SODIUM 2.5 MG PO TABS: 10.0000 mg | ORAL_TABLET | ORAL | 0 refills | Status: DC

## 2018-11-28 NOTE — Telephone Encounter (Signed)
Last Visit: 11/16/18 Next Visit: 02/15/19 Labs: 11/16/18 Mild anemia  Okay to refill per Dr. Corliss Skains

## 2018-11-28 NOTE — Telephone Encounter (Signed)
Patient needs refill Sulfasalazine, MTX sent to [facility]. (from the best I can understand. Patient does not speak [place].

## 2018-12-06 ENCOUNTER — Encounter (INDEPENDENT_AMBULATORY_CARE_PROVIDER_SITE_OTHER): Payer: Self-pay

## 2018-12-06 ENCOUNTER — Encounter (INDEPENDENT_AMBULATORY_CARE_PROVIDER_SITE_OTHER): Payer: Medicare Other | Admitting: Ophthalmology

## 2018-12-11 ENCOUNTER — Encounter (HOSPITAL_COMMUNITY): Payer: Medicare Other

## 2018-12-13 ENCOUNTER — Ambulatory Visit: Payer: Medicare Other

## 2018-12-20 ENCOUNTER — Other Ambulatory Visit: Payer: Self-pay | Admitting: Podiatry

## 2018-12-20 ENCOUNTER — Ambulatory Visit (INDEPENDENT_AMBULATORY_CARE_PROVIDER_SITE_OTHER): Payer: Medicare Other | Admitting: Podiatry

## 2018-12-20 ENCOUNTER — Encounter: Payer: Self-pay | Admitting: Podiatry

## 2018-12-20 ENCOUNTER — Ambulatory Visit (INDEPENDENT_AMBULATORY_CARE_PROVIDER_SITE_OTHER): Payer: Medicare Other

## 2018-12-20 ENCOUNTER — Telehealth: Payer: Self-pay | Admitting: *Deleted

## 2018-12-20 DIAGNOSIS — M79672 Pain in left foot: Secondary | ICD-10-CM | POA: Diagnosis not present

## 2018-12-20 DIAGNOSIS — I739 Peripheral vascular disease, unspecified: Secondary | ICD-10-CM

## 2018-12-20 DIAGNOSIS — M2042 Other hammer toe(s) (acquired), left foot: Secondary | ICD-10-CM

## 2018-12-20 DIAGNOSIS — M2041 Other hammer toe(s) (acquired), right foot: Secondary | ICD-10-CM

## 2018-12-20 DIAGNOSIS — I872 Venous insufficiency (chronic) (peripheral): Secondary | ICD-10-CM

## 2018-12-20 DIAGNOSIS — M79671 Pain in right foot: Secondary | ICD-10-CM

## 2018-12-20 MED ORDER — TRAMADOL HCL 50 MG PO TABS: 50.0000 mg | ORAL_TABLET | Freq: Three times a day (TID) | ORAL | 0 refills | Status: AC | PRN

## 2018-12-20 NOTE — Telephone Encounter (Signed)
-----   Message from Felecia Shelling, DPM sent at 12/20/2018  3:11 PM EST ----- Regarding: Venous and arterial doppler LLE Please order venous and arterial doppler left lower extremity.   Dx: venous insufficiency LLE. PVD LLE.   Thanks, Dr. Logan Bores

## 2018-12-20 NOTE — Telephone Encounter (Signed)
[facility] - MEDICAID APPROVED ABI WITH TBI, ARTERIAL DOPPLERS, AND VENOUS REFLUX STUDIES NOTIFICATION STATES, "THIS [place] MEDICAID MEMBER DOES NOT REQUIRE PRIOR AUTHORIZATION FOR OUTPATIENT RADIOLOGY THROUGH [facility] OR [facility] AT THIS TIME." Faxed orders to [facility].

## 2018-12-21 NOTE — Telephone Encounter (Signed)
[name] - [facility] asked if the arterial dopplers needed to be Stat. I told her no.

## 2018-12-24 NOTE — Progress Notes (Signed)
   HPI: 78 year old female presenting today with a chief complaint of pain to the ball of the feet bilaterally as well as to the toes that has been ongoing for the past few years. She describes the pain as sharp, burning and tingling. She also notes that her left leg is discolored and it is progressing up the extremity. She has not done anything for treatment. Walking and wearing shoes increases the toe pain. Patient is here for further evaluation and treatment.   Past Medical History:  Diagnosis Date  . Arthritis    RA, sees Dr.  Ethelene Hal  . Cataracts, bilateral    hx of  . Chronic back pain   . Complication of anesthesia   . GERD (gastroesophageal reflux disease)   . Headache(784.0)   . PONV (postoperative nausea and vomiting)      Physical Exam: General: The patient is alert and oriented x3 in no acute distress.  Dermatology: Skin is warm, dry and supple bilateral lower extremities. Negative for open lesions or macerations.  Vascular: Left lower extremity discoloration noted consistent with venous insufficiency. Palpable pedal pulses bilaterally. No edema or erythema noted. Capillary refill within normal limits.  Neurological: Epicritic and protective threshold grossly intact bilaterally.   Musculoskeletal Exam: Pain with palpation noted to the left proximal Achilles with hard nodular areas present. Range of motion within normal limits to all pedal and ankle joints bilateral. Muscle strength 5/5 in all groups bilateral.   Radiographic Exam:  Normal osseous mineralization. Joint spaces preserved. No fracture/dislocation/boney destruction.    Assessment: 1. LLE discoloration consistent with venous insufficiency  2. Pain with palpation to the left proximal Achilles with hard nodular areas present  3. Rheumatoid arthritis    Plan of Care:  1. Patient evaluated. X-Rays reviewed.  2. Prescription for Tramadol 50 mg #90 TID provided to patient.  3. Venous and arterial doppler of the  LLE ordered  4. Return to clinic in 4 weeks. Possible vascular referral at that time.      Felecia Shelling, DPM [facility]  Dr. Felecia Shelling, DPM    [address] [place], [place] [address]                Office [phone]  Fax [phone]

## 2019-01-04 ENCOUNTER — Encounter (INDEPENDENT_AMBULATORY_CARE_PROVIDER_SITE_OTHER): Payer: Medicare Other | Admitting: Ophthalmology

## 2019-01-04 DIAGNOSIS — H43813 Vitreous degeneration, bilateral: Secondary | ICD-10-CM

## 2019-01-04 DIAGNOSIS — M069 Rheumatoid arthritis, unspecified: Secondary | ICD-10-CM | POA: Diagnosis not present

## 2019-01-04 DIAGNOSIS — H353132 Nonexudative age-related macular degeneration, bilateral, intermediate dry stage: Secondary | ICD-10-CM | POA: Diagnosis not present

## 2019-01-05 ENCOUNTER — Ambulatory Visit
Admission: RE | Admit: 2019-01-05 | Discharge: 2019-01-05 | Disposition: A | Discharge status: Home / Self Care | Payer: Medicare Other | Source: Ambulatory Visit | Attending: Internal Medicine | Admitting: Internal Medicine

## 2019-01-05 DIAGNOSIS — Z1231 Encounter for screening mammogram for malignant neoplasm of breast: Secondary | ICD-10-CM

## 2019-01-08 ENCOUNTER — Ambulatory Visit (HOSPITAL_COMMUNITY)
Admission: RE | Admit: 2019-01-08 | Discharge: 2019-01-08 | Disposition: A | Discharge status: Home / Self Care | Payer: Medicare Other | Source: Ambulatory Visit | Attending: Family | Admitting: Family

## 2019-01-08 DIAGNOSIS — I872 Venous insufficiency (chronic) (peripheral): Secondary | ICD-10-CM | POA: Diagnosis present

## 2019-01-08 DIAGNOSIS — I739 Peripheral vascular disease, unspecified: Secondary | ICD-10-CM | POA: Insufficient documentation

## 2019-01-10 ENCOUNTER — Other Ambulatory Visit: Payer: Self-pay

## 2019-01-10 ENCOUNTER — Encounter: Payer: Self-pay | Admitting: Podiatry

## 2019-01-10 ENCOUNTER — Ambulatory Visit (INDEPENDENT_AMBULATORY_CARE_PROVIDER_SITE_OTHER): Payer: Medicare Other | Admitting: Podiatry

## 2019-01-10 DIAGNOSIS — M79672 Pain in left foot: Secondary | ICD-10-CM

## 2019-01-10 DIAGNOSIS — I739 Peripheral vascular disease, unspecified: Secondary | ICD-10-CM | POA: Diagnosis not present

## 2019-01-10 DIAGNOSIS — I872 Venous insufficiency (chronic) (peripheral): Secondary | ICD-10-CM

## 2019-01-10 DIAGNOSIS — M05771 Rheumatoid arthritis with rheumatoid factor of right ankle and foot without organ or systems involvement: Secondary | ICD-10-CM | POA: Diagnosis not present

## 2019-01-10 DIAGNOSIS — M79671 Pain in right foot: Secondary | ICD-10-CM | POA: Diagnosis not present

## 2019-01-10 DIAGNOSIS — M05772 Rheumatoid arthritis with rheumatoid factor of left ankle and foot without organ or systems involvement: Secondary | ICD-10-CM

## 2019-01-10 NOTE — Progress Notes (Signed)
   HPI: Patient presents for follow-up evaluation of bilateral foot pain.  Patient has a history of rheumatoid arthritis and chronic back and lower extremity pain.  Last visit on 12/20/2018 venous and arterial Doppler studies were ordered for the left lower extremity.  She is currently taking tramadol 50 mg as needed pain.  Patient is currently being managed by a rheumatology, Dr. Ethelene Hal, as well as primary care physician.  Pain is unchanged since last visit  Past Medical History:  Diagnosis Date  . Arthritis    RA, sees Dr.  Ethelene Hal  . Cataracts, bilateral    hx of  . Chronic back pain   . Complication of anesthesia   . GERD (gastroesophageal reflux disease)   . Headache(784.0)   . PONV (postoperative nausea and vomiting)      Physical Exam: General: The patient is alert and oriented x3 in no acute distress.  Dermatology: Skin is warm, dry and supple bilateral lower extremities. Negative for open lesions or macerations.  Vascular: Left lower extremity discoloration noted consistent with venous insufficiency. Palpable pedal pulses bilaterally. No edema or erythema noted. Capillary refill within normal limits.  Neurological: Epicritic and protective threshold grossly intact bilaterally.   Musculoskeletal Exam: Pain with palpation noted to the left proximal Achilles with hard nodular areas present. Range of motion within normal limits to all pedal and ankle joints bilateral. Muscle strength 5/5 in all groups bilateral.   Radiographic Exam:  Normal osseous mineralization. Joint spaces preserved. No fracture/dislocation/boney destruction.    Assessment: 1. LLE discoloration consistent with venous insufficiency  2. Rheumatoid arthritis  3.  Diffuse bilateral foot and ankle pain  Plan of Care:  1. Patient evaluated.  Venous Doppler which was performed on 01/08/2019 was reviewed and negative for DVT or any concerning venous insufficiency.  2.  Continue tramadol 50 mg as needed pain  3.   Patient is scheduled for arterial Doppler on 01/18/2019.  Office will contact the patient regarding results. 4.  Recommend continued follow-up with rheumatology and PCP     Felecia Shelling, DPM [facility]  Dr. Felecia Shelling, DPM    [address] [place], [place] [address]                Office [phone]  Fax [phone]

## 2019-01-18 ENCOUNTER — Encounter (HOSPITAL_COMMUNITY): Payer: Medicare Other

## 2019-01-20 ENCOUNTER — Other Ambulatory Visit: Payer: Self-pay | Admitting: Rheumatology

## 2019-01-22 NOTE — Telephone Encounter (Signed)
Last Visit: 11/16/2018 Next Visit: 02/15/2019 Labs: 11/16/2018 mild anemia.   Okay to refill per Dr. Corliss Skains.

## 2019-01-25 ENCOUNTER — Other Ambulatory Visit: Payer: Self-pay | Admitting: Vascular Surgery

## 2019-02-06 NOTE — Progress Notes (Signed)
Virtual Visit via Telephone Note  I connected with [name] on 02/06/19 at  3:00 PM EDT by a video enabled telemedicine application and verified that I am speaking with the correct person using two identifiers.   I discussed the limitations of evaluation and management by telemedicine and the availability of in person appointments. The patient expressed understanding and agreed to proceed.  DY:JWLK in both ankle joints    History of Present Illness: Patient is a 78 year old female with past medical history of seropositive rheumatoid arthritis and osteoporosis.  She is taking MTX 4 tablets po once weekly, folic acid 1 mg po daily, and sulfasalazine 500 mg 2 tablets by mouth BID.  She is on Fosamax 70 mg po once weekly for management of osteoporosis. She has occasional pain in both hands.  She has pain in both ankle joints.  She has discomfort when walking.  She has no joint swelling.  She has occasional morning stiffness.     Review of Systems  Constitutional: Negative for fever and malaise/fatigue.  Eyes: Negative for photophobia, pain, discharge and redness.  Respiratory: Negative for cough, shortness of breath and wheezing.   Cardiovascular: Negative for chest pain and palpitations.  Gastrointestinal: Negative for blood in stool, constipation and diarrhea.  Genitourinary: Negative for dysuria.  Musculoskeletal: Positive for joint pain. Negative for back pain, myalgias and neck pain.  Skin: Negative for rash.  Neurological: Negative for dizziness and headaches.  Psychiatric/Behavioral: Negative for depression. The patient is not nervous/anxious and does not have insomnia.    Observations/Objective:  Physical Exam  Constitutional: She is oriented to person, place, and time.  Neurological: She is alert and oriented to person, place, and time.  Psychiatric: Mood, memory, affect and judgment normal.   Patient reports morning stiffness for 15 minutes.   Patient denies nocturnal  pain.  Difficulty dressing/grooming: Denies Difficulty climbing stairs: Reports Difficulty getting out of chair: Reports Difficulty using hands for taps, buttons, cutlery, and/or writing: Reports   Assessment and Plan: Visit Diagnoses: Rheumatoid arthritis involving both hands with positive rheumatoid factor (HCC) - Severe end-stage rheumatoid arthritis: She has occasional pain in both hands and both ankle joints.  She has no joint swelling.  She has difficulty walking due to the discomfort she experiences in both feet. We discussed the importance of wearing proper fitting shoes.  She walks barefoot most of the day, which we discouraged. She is overall clinically doing well on MTX 4 tablets po once weekly, folic acid 1 mg po daily, and sulfasalazine 500 mg 2 tablets by mouth BID.  She will continue on this current treatment regimen.  She does not need any refill at this time.  She was advised to notify us if she develops increased joint pain or joint swelling. She will follow up in 3-4 months.   High risk medication use -Methotrexate 4 tablets weekly, sulfasalazine 500 mg twice daily, along with folic acid 1 mg daily. CBC and CMP were drawn on 11/16/18.  Lab work was reviewed.  She is due for repeat lab work.  Standing labs are in place.   Age-related osteoporosis without current pathological fracture - She is taking Fosamax 70 mg po once weekly prescribed by Dr. Eula Listen.  She is taking a calcium and vitamin D supplement daily.   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   Language barrier  Follow Up Instructions: She will follow up in 3-4 months.  Standing orders are in place.  She is due for CBC and CMP.    I discussed the assessment and treatment plan with the patient. The patient was provided an opportunity to ask questions and all were answered. The  patient agreed with the plan and demonstrated an understanding of the instructions.   The patient was advised to call back or seek an in-person evaluation if the symptoms worsen or if the condition fails to improve as anticipated.  I provided 22 minutes of non-face-to-face time during this encounter.  Pollyann Savoy, MD   Scribed by-  Sherron Ales, PA-C

## 2019-02-15 ENCOUNTER — Telehealth (INDEPENDENT_AMBULATORY_CARE_PROVIDER_SITE_OTHER): Payer: Medicare Other | Admitting: Rheumatology

## 2019-02-15 ENCOUNTER — Encounter: Payer: Self-pay | Admitting: Rheumatology

## 2019-02-15 ENCOUNTER — Ambulatory Visit: Payer: Medicare Other | Admitting: Rheumatology

## 2019-02-15 DIAGNOSIS — M05742 Rheumatoid arthritis with rheumatoid factor of left hand without organ or systems involvement: Secondary | ICD-10-CM

## 2019-02-15 DIAGNOSIS — Z603 Acculturation difficulty: Secondary | ICD-10-CM

## 2019-02-15 DIAGNOSIS — M05741 Rheumatoid arthritis with rheumatoid factor of right hand without organ or systems involvement: Secondary | ICD-10-CM | POA: Diagnosis not present

## 2019-02-15 DIAGNOSIS — H35389 Toxic maculopathy, unspecified eye: Secondary | ICD-10-CM

## 2019-02-15 DIAGNOSIS — Z8739 Personal history of other diseases of the musculoskeletal system and connective tissue: Secondary | ICD-10-CM

## 2019-02-15 DIAGNOSIS — Z8719 Personal history of other diseases of the digestive system: Secondary | ICD-10-CM

## 2019-02-15 DIAGNOSIS — M810 Age-related osteoporosis without current pathological fracture: Secondary | ICD-10-CM

## 2019-02-15 DIAGNOSIS — T372X5A Adverse effect of antimalarials and drugs acting on other blood protozoa, initial encounter: Secondary | ICD-10-CM

## 2019-02-15 DIAGNOSIS — Z79899 Other long term (current) drug therapy: Secondary | ICD-10-CM

## 2019-02-15 DIAGNOSIS — E559 Vitamin D deficiency, unspecified: Secondary | ICD-10-CM

## 2019-02-15 DIAGNOSIS — Z87898 Personal history of other specified conditions: Secondary | ICD-10-CM

## 2019-02-15 DIAGNOSIS — R7612 Nonspecific reaction to cell mediated immunity measurement of gamma interferon antigen response without active tuberculosis: Secondary | ICD-10-CM

## 2019-02-15 DIAGNOSIS — Z789 Other specified health status: Secondary | ICD-10-CM

## 2019-03-02 ENCOUNTER — Other Ambulatory Visit: Payer: Self-pay | Admitting: Rheumatology

## 2019-03-02 NOTE — Telephone Encounter (Addendum)
Last Visit: 02/15/2019 Next Visit: 07/19/2019  Okay to refill per Dr. Corliss Skains.

## 2019-03-12 ENCOUNTER — Telehealth (HOSPITAL_COMMUNITY): Payer: Self-pay

## 2019-03-12 NOTE — Telephone Encounter (Signed)
Left voicemail re: upcoming appt / masking/ limited visitors.

## 2019-03-13 ENCOUNTER — Telehealth: Payer: Self-pay | Admitting: Podiatry

## 2019-03-13 NOTE — Telephone Encounter (Signed)
Pt was scheduled for testing tomorrow with the Vascular office on [facility] and the patient called them and cancelled the appt and stated she did not want to reschedule at this time due to the COVID-19 outbreak. Office calling to make Dr.Evans aware.

## 2019-03-14 ENCOUNTER — Ambulatory Visit (HOSPITAL_COMMUNITY): Admission: RE | Admit: 2019-03-14 | Payer: Medicare Other | Source: Ambulatory Visit

## 2019-03-14 ENCOUNTER — Ambulatory Visit (HOSPITAL_COMMUNITY): Payer: Medicare Other

## 2019-05-02 ENCOUNTER — Ambulatory Visit: Payer: Medicare Other | Admitting: Podiatry

## 2019-06-07 ENCOUNTER — Other Ambulatory Visit: Payer: Self-pay | Admitting: Rheumatology

## 2019-06-07 NOTE — Telephone Encounter (Addendum)
Last Visit:02/15/2019 Next Visit:07/19/2019 Labs: 11/16/2018 Mild anemia.  Left message to advise patient she is due to update labs.  Okay to refill 30 day supply MTX?

## 2019-06-08 NOTE — Telephone Encounter (Signed)
Need labs prior to refill.

## 2019-06-27 ENCOUNTER — Emergency Department (HOSPITAL_COMMUNITY)
Admission: EM | Admit: 2019-06-27 | Discharge: 2019-06-27 | Disposition: A | Discharge status: Home / Self Care | Payer: Medicare Other | Attending: Emergency Medicine | Admitting: Emergency Medicine

## 2019-06-27 ENCOUNTER — Emergency Department (HOSPITAL_COMMUNITY): Payer: Medicare Other

## 2019-06-27 DIAGNOSIS — M069 Rheumatoid arthritis, unspecified: Secondary | ICD-10-CM | POA: Diagnosis not present

## 2019-06-27 DIAGNOSIS — Z79899 Other long term (current) drug therapy: Secondary | ICD-10-CM | POA: Diagnosis not present

## 2019-06-27 DIAGNOSIS — G5791 Unspecified mononeuropathy of right lower limb: Secondary | ICD-10-CM | POA: Diagnosis not present

## 2019-06-27 DIAGNOSIS — M79604 Pain in right leg: Secondary | ICD-10-CM | POA: Diagnosis present

## 2019-06-27 DIAGNOSIS — G629 Polyneuropathy, unspecified: Secondary | ICD-10-CM

## 2019-06-27 LAB — CBC WITH DIFFERENTIAL/PLATELET
Abs Immature Granulocytes: 0.01 10*3/uL (ref 0.00–0.07)
Basophils Absolute: 0 10*3/uL (ref 0.0–0.1)
Basophils Relative: 0 %
Eosinophils Absolute: 0.1 10*3/uL (ref 0.0–0.5)
Eosinophils Relative: 2 %
HCT: 34.2 % — ABNORMAL LOW (ref 36.0–46.0)
Hemoglobin: 11.1 g/dL — ABNORMAL LOW (ref 12.0–15.0)
Immature Granulocytes: 0 %
Lymphocytes Relative: 28 %
Lymphs Abs: 1.4 10*3/uL (ref 0.7–4.0)
MCH: 30.8 pg (ref 26.0–34.0)
MCHC: 32.5 g/dL (ref 30.0–36.0)
MCV: 95 fL (ref 80.0–100.0)
Monocytes Absolute: 0.8 10*3/uL (ref 0.1–1.0)
Monocytes Relative: 17 %
Neutro Abs: 2.6 10*3/uL (ref 1.7–7.7)
Neutrophils Relative %: 53 %
Platelets: 205 10*3/uL (ref 150–400)
RBC: 3.6 MIL/uL — ABNORMAL LOW (ref 3.87–5.11)
RDW: 14.6 % (ref 11.5–15.5)
WBC: 4.9 10*3/uL (ref 4.0–10.5)
nRBC: 0 % (ref 0.0–0.2)

## 2019-06-27 LAB — BASIC METABOLIC PANEL
Anion gap: 10 (ref 5–15)
BUN: 16 mg/dL (ref 8–23)
CO2: 26 mmol/L (ref 22–32)
Calcium: 9.9 mg/dL (ref 8.9–10.3)
Chloride: 103 mmol/L (ref 98–111)
Creatinine, Ser: 0.7 mg/dL (ref 0.44–1.00)
GFR calc Af Amer: >60 mL/min (ref >60)
GFR calc non Af Amer: >60 mL/min (ref >60)
Glucose, Bld: 74 mg/dL (ref 70–99)
Potassium: 4.6 mmol/L (ref 3.5–5.1)
Sodium: 139 mmol/L (ref 135–145)

## 2019-06-27 MED ORDER — GABAPENTIN 300 MG PO CAPS: 300.0000 mg | ORAL_CAPSULE | Freq: Every day | ORAL | 0 refills | Status: DC

## 2019-06-27 MED ORDER — HYDROCODONE-ACETAMINOPHEN 5-325 MG PO TABS
1.0000 | ORAL_TABLET | Freq: Once | ORAL | Status: AC
  Administered 2019-06-27: 18:00:00 1 via ORAL
  Filled 2019-06-27: qty 1

## 2019-06-27 MED ORDER — HYDROCODONE-ACETAMINOPHEN 5-325 MG PO TABS: 1.0000 | ORAL_TABLET | ORAL | 0 refills | Status: DC | PRN

## 2019-06-27 NOTE — ED Triage Notes (Signed)
Patient arrived by [facility] from home with c/o right leg pain secondary to fall 5 days ago. Patient A&O x4, speaks [place]/Hindu and some [place].

## 2019-06-27 NOTE — ED Provider Notes (Signed)
[facility]-EMERGENCY DEPT Provider Note   CSN: [id number] Arrival date & time: 06/27/19  1630     History   Chief Complaint Chief Complaint  Patient presents with   Leg Pain    right leg pain    HPI [name] is a 78 y.o. female.     Pt presents to the ED today with bilateral feet pain which she describes as a burning.  She speaks [place] and her son interprets.  The pt said pain has worsened over the past 5 days.  She denies a fall, however EMS told us there was a fall.  No sob or cough.  She has been taking tramadol for her pain.     Past Medical History:  Diagnosis Date   Arthritis    RA, sees Dr.  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  03/07/2017   High risk medication use 03/07/2017   History of gastroesophageal reflux (GERD) 03/07/2017   Kyphosis 03/07/2017   Idiopathic scoliosis 03/07/2017   Age-related osteoporosis without current pathological fracture 03/07/2017   Positive QuantiFERON-TB Gold test 03/07/2017   Pulmonary nodules 03/07/2017   Dermatitis 09/09/2016   Rheumatoid arthritis involving both hands with positive rheumatoid factor (HCC) 04/22/2016   Seropositive rheumatoid arthritis of multiple sites (HCC) 09/26/2015    Past Surgical History:  Procedure Laterality Date   EYE SURGERY     cataract bilateral    FINGER ARTHRODESIS  06/06/2012   Procedure: ARTHRODESIS FINGER;  Surgeon: Dominica Severin, MD;  Location: [facility] OR;  Service: Orthopedics;  Laterality: Right;  RIGHT SMALL FINGER AND RING FINGER PIP FUSION WITH AUTOLOGOUS GRAFT FROM RIGHT WRIST   FINGER ARTHROPLASTY Left 09/26/2015   Procedure: LEFT HAND METACARPOPHALANGEAL ARTHROPLASTY INDEX MIDDLE RING AND SMALL FINGERS WITH REALIGNMENT OF ;  Surgeon: Dominica Severin, MD;  Location: [facility] OR;  Service: Orthopedics;  Laterality: Left;   FINGER SURGERY Right 09/20/2017   screws in 4th and 5th digit   FOOT SURGERY     FRACTURE SURGERY     under right eye   HAND SURGERY     HARDWARE REMOVAL Right 09/20/2017   Procedure: Right hand hardware removal  ring and small fingers;  Surgeon: Dominica Severin, MD;  Location: [facility] OR;  Service: Orthopedics;  Laterality: Right;  60 mins   MULTIPLE EXTRACTIONS WITH ALVEOLOPLASTY N/A 08/04/2018   Procedure: MULTIPLE EXTRACTION;  Surgeon: Ocie Doyne, DDS;  Location: [facility] OR;  Service: Oral Surgery;  Laterality: N/A;   PROXIMAL INTERPHALANGEAL FUSION (PIP) Left 04/22/2016   Procedure: PROXIMAL INTERPHALANGEAL FUSION (PIP) Index, middle, and ring;  Surgeon: Dominica Severin, MD;  Location: [facility] OR;  Service: Orthopedics;  Laterality: Left;   REPAIR EXTENSOR TENDON Left 09/26/2015   Procedure: EXTENSOR TENDON;  Surgeon: Dominica Severin, MD;  Location: [facility] OR;  Service: Orthopedics;  Laterality: Left;     OB History  No obstetric history on file.      Home Medications    Prior to Admission medications   Medication Sig Start Date End Date Taking? Authorizing Provider  Calcium Carbonate-Vitamin D (CALCIUM 600+D) 600-400 MG-UNIT tablet Take 1 tablet by mouth 2 (two) times daily.   Yes [provider]  calcium-vitamin D (OSCAL WITH D) 500-200 MG-UNIT tablet Take 1 tablet by mouth 2 (two) times daily. 08/05/18  Yes Diona Browner, DDS  cycloSPORINE (RESTASIS) 0.05 % ophthalmic emulsion Place 1 drop into both eyes  2 (two) times daily.   Yes [provider]  folic acid (FOLVITE) 1 MG tablet Take 1 tablet (1 mg total) by mouth daily. 02/09/18 06/27/19 Yes Deveshwar, Abel Presto, MD  sulfaSALAzine (AZULFIDINE) 500 MG tablet TAKE 1 TABLET (500 MG TOTAL) BY MOUTH TWO TIMES DAILY. Patient taking differently: Take 500 mg by mouth 2 (two) times daily.  11/28/18  Yes Deveshwar, Abel Presto, MD  traMADol (ULTRAM) 50 MG tablet Take 1 tablet (50 mg total) by mouth 3 (three) times daily as needed (pain). 12/20/18  Yes Edrick Kins, DPM  Vitamin D, Ergocalciferol, (DRISDOL) 50000 units CAPS capsule TAKE ONE CAPSULE BY MOUTH PER MONTH Patient taking differently: Take 50,000 Units by mouth every Saturday.  11/19/16  Yes Deveshwar, Abel Presto, MD  diclofenac sodium (VOLTAREN) 1 % GEL USE 2-4 GRAMS TO AFFECTED JOINTS UP TO FOUR TIMES A DAY Patient not taking: Reported on 06/27/2019 03/02/19   Bo Merino, MD  gabapentin (NEURONTIN) 300 MG capsule Take 1 capsule (300 mg total) by mouth at bedtime. 06/27/19   Isla Pence, MD  HYDROcodone-acetaminophen (NORCO/VICODIN) 5-325 MG tablet Take 1 tablet by mouth every 4 (four) hours as needed. 06/27/19   Isla Pence, MD  methotrexate (RHEUMATREX) 2.5 MG tablet TAKE 4 TABLETS (10 MG TOTAL) BY MOUTH ONCE A WEEK. Patient not taking: Reported on 06/27/2019 01/22/19   Bo Merino, MD  oxyCODONE-acetaminophen (PERCOCET) 5-325 MG tablet Take 1 tablet by mouth every 4 (four) hours as needed. Patient not taking: Reported on 06/27/2019 08/05/18   Diona Browner, DDS    Family History No family history on file.  Social History Social History   Tobacco Use   Smoking status: Never Smoker   Smokeless tobacco: Never Used  Substance Use Topics   Alcohol use: No   Drug use: No     Allergies   Patient has no known allergies.   Review of Systems Review of Systems  Musculoskeletal:       Bilateral feet pain  All other systems reviewed and are negative.    Physical  Exam Updated Vital Signs BP (!) 142/87    Pulse 80    Temp 99 F (37.2 C) (Oral)    Resp 16    SpO2 100%   Physical Exam Vitals signs and nursing note reviewed.  Constitutional:      Appearance: Normal appearance.  HENT:     Head: Normocephalic and atraumatic.     Right Ear: External ear normal.     Left Ear: External ear normal.     Nose: Nose normal.     Mouth/Throat:     Mouth: Mucous membranes are moist.     Pharynx: Oropharynx is clear.  Eyes:     Extraocular Movements: Extraocular movements intact.     Conjunctiva/sclera: Conjunctivae normal.     Pupils: Pupils are equal, round, and reactive to light.  Neck:     Musculoskeletal: Normal range of motion.  Cardiovascular:  Rate and Rhythm: Normal rate and regular rhythm.     Pulses: Normal pulses.     Heart sounds: Normal heart sounds.  Pulmonary:     Effort: Pulmonary effort is normal.     Breath sounds: Normal breath sounds.  Abdominal:     General: Abdomen is flat. Bowel sounds are normal.     Palpations: Abdomen is soft.  Musculoskeletal:     Comments: Severe scoliosis  Arthritis to hands and feet  Skin:    General: Skin is warm.     Capillary Refill: Capillary refill takes less than 2 seconds.  Neurological:     General: No focal deficit present.     Mental Status: She is alert and oriented to person, place, and time.  Psychiatric:        Mood and Affect: Mood normal.        Behavior: Behavior normal.      ED Treatments / Results  Labs (all labs ordered are listed, but only abnormal results are displayed) Labs Reviewed  CBC WITH DIFFERENTIAL/PLATELET - Abnormal; Notable for the following components:      Result Value   RBC 3.60 (*)    Hemoglobin 11.1 (*)    HCT 34.2 (*)    All other components within normal limits  BASIC METABOLIC PANEL  URINALYSIS, ROUTINE W REFLEX MICROSCOPIC    EKG None  Radiology Dg Foot Complete Right  Result Date: 06/27/2019 CLINICAL DATA:  78 year old female with  right foot pain since falling 5 days ago EXAM: RIGHT FOOT COMPLETE - 3+ VIEW COMPARISON:  Prior radiographs of the right foot 12/20/2018 FINDINGS: No evidence of acute fracture or malalignment. Stable appearance of the digits with prior amputation of the fifth digit and complete lateral dislocation of the fourth MTP joint. Lateral subluxation of the second and third MTP joints also unchanged. There is midfoot osteoarthritis primarily at the talonavicular joint. Degenerative osteoarthritis also present at the great toe MTP joint. The bones are diffusely osteopenic. IMPRESSION: Stable radiographs of the right foot. No evidence of acute fracture or malalignment. The fifth digit is absent and there is chronic lateral subluxation/dislocation of the second, third and fourth digits. Electronically Signed   By: Malachy Moan M.D.   On: 06/27/2019 17:41    Procedures Procedures (including critical care time)  Medications Ordered in ED Medications  HYDROcodone-acetaminophen (NORCO/VICODIN) 5-325 MG per tablet 1 tablet (1 tablet Oral Given 06/27/19 1806)     Initial Impression / Assessment and Plan / ED Course  I have reviewed the triage vital signs and the nursing notes.  Pertinent labs & imaging results that were available during my care of the patient were reviewed by me and considered in my medical decision making (see chart for details).    Pt's sx sound like neuropathy.  She is able to ambulate.  She is not diabetic.  She will be d/c home with neurontin and lortab.  She is instructed to f/u with her pcp.  Return if worse.  Final Clinical Impressions(s) / ED Diagnoses   Final diagnoses:  Neuropathy    ED Discharge Orders         Ordered    gabapentin (NEURONTIN) 300 MG capsule  Daily at bedtime     06/27/19 2032    HYDROcodone-acetaminophen (NORCO/VICODIN) 5-325 MG tablet  Every 4 hours PRN     06/27/19 2032           Jacalyn Lefevre, MD 06/27/19 2048

## 2019-06-27 NOTE — ED Notes (Signed)
Pt ambulated self to bathroom.  

## 2019-07-05 NOTE — Progress Notes (Deleted)
Office Visit Note  Patient: [name]             Date of Birth: [birth year]           MRN: [id number]             PCP: Georgann Housekeeper, MD Referring: Georgann Housekeeper, MD Visit Date: 07/19/2019 Occupation: @GUAROCC @  Subjective:  No chief complaint on file.   History of Present Illness: [name] is a 78 y.o. female ***   Activities of Daily Living:  Patient reports morning stiffness for *** {minute/hour:19697}.   Patient {ACTIONS;DENIES/REPORTS:21021675::"Denies"} nocturnal pain.  Difficulty dressing/grooming: {ACTIONS;DENIES/REPORTS:21021675::"Denies"} Difficulty climbing stairs: {ACTIONS;DENIES/REPORTS:21021675::"Denies"} Difficulty getting out of chair: {ACTIONS;DENIES/REPORTS:21021675::"Denies"} Difficulty using hands for taps, buttons, cutlery, and/or writing: {ACTIONS;DENIES/REPORTS:21021675::"Denies"}  No Rheumatology ROS completed.   PMFS History:  Patient Active Problem List   Diagnosis Date Noted  . Post-operative state 08/04/2018  . Complication of anesthesia   . Lumbar spondylosis 04/13/2018  . Chronic pain syndrome 03/08/2018  . Degeneration of lumbosacral intervertebral disc 11/28/2017  . Rheumatoid arthritis (HCC) 09/20/2017  . Language barrier 06/09/2017  . History of Pulmonary nodulosis  06/09/2017  . 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 High risk medication use 03/07/2017  . History of gastroesophageal reflux (GERD) 03/07/2017  . Kyphosis 03/07/2017  . Idiopathic scoliosis 03/07/2017  . Age-related osteoporosis without current pathological fracture 03/07/2017  . 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 Pulmonary nodules 03/07/2017  . Dermatitis 09/09/2016  . 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 Arthritis    RA, sees  Dr.  09/28/2015  . Cataracts, bilateral    hx of  . Chronic back pain   . Complication of anesthesia   . GERD (gastroesophageal reflux disease)   . Headache(784.0)   . PONV (postoperative nausea and vomiting)     No family history on file. Past Surgical History:  Procedure Laterality Date  . EYE SURGERY     cataract bilateral  . FINGER ARTHRODESIS  06/06/2012   Procedure: ARTHRODESIS FINGER;  Surgeon: 08/06/2012, MD;  Location: [facility] OR;  Service: Orthopedics;  Laterality: Right;  RIGHT SMALL FINGER AND RING FINGER PIP FUSION WITH AUTOLOGOUS GRAFT FROM RIGHT WRIST  . FINGER ARTHROPLASTY Left 09/26/2015   Procedure: LEFT HAND METACARPOPHALANGEAL ARTHROPLASTY INDEX MIDDLE RING AND SMALL FINGERS WITH REALIGNMENT OF ;  Surgeon: 09/28/2015, MD;  Location: [facility] OR;  Service: Orthopedics;  Laterality: Left;  . FINGER SURGERY Right 09/20/2017   screws in 4th and 5th digit  . FOOT SURGERY    . FRACTURE SURGERY     under right eye  . HAND SURGERY    . HARDWARE REMOVAL Right 09/20/2017   Procedure: Right hand hardware removal  ring and small fingers;  Surgeon: 09/22/2017, MD;  Location: [facility] OR;  Service: Orthopedics;  Laterality: Right;  60 mins  . MULTIPLE EXTRACTIONS WITH ALVEOLOPLASTY N/A 08/04/2018   Procedure: MULTIPLE EXTRACTION;  Surgeon: 10/04/2018, DDS;  Location: [facility] OR;  Service: Oral Surgery;  Laterality: N/A;  . PROXIMAL INTERPHALANGEAL FUSION (PIP) Left 04/22/2016   Procedure: PROXIMAL INTERPHALANGEAL FUSION (PIP) Index, middle, and ring;  Surgeon: 04/24/2016, MD;  Location: [facility] OR;  Service: Orthopedics;  Laterality: Left;  . REPAIR EXTENSOR TENDON Left 09/26/2015   Procedure: EXTENSOR TENDON;  Surgeon: 09/28/2015, MD;  [facility];  Service: Orthopedics;  Laterality: Left;   Social History   Social History Narrative  . Not on file   Immunization History  Administered Date(s) Administered  . Influenza,inj,quad, With Preservative 08/01/2017     Objective:  Vital Signs: There were no vitals taken for this visit.   Physical Exam   Musculoskeletal Exam: ***  CDAI Exam: CDAI Score: - Patient Global: -; Provider Global: - Swollen: -; Tender: - Joint Exam   No joint exam has been documented for this visit   There is currently no information documented on the homunculus. Go to the Rheumatology activity and complete the homunculus joint exam.  Investigation: No additional findings.  Imaging: Dg Foot Complete Right  Result Date: 06/27/2019 CLINICAL DATA:  78 year old female with right foot pain since falling 5 days ago EXAM: RIGHT FOOT COMPLETE - 3+ VIEW COMPARISON:  Prior radiographs of the right foot 12/20/2018 FINDINGS: No evidence of acute fracture or malalignment. Stable appearance of the digits with prior amputation of the fifth digit and complete lateral dislocation of the fourth MTP joint. Lateral subluxation of the second and third MTP joints also unchanged. There is midfoot osteoarthritis primarily at the talonavicular joint. Degenerative osteoarthritis also present at the great toe MTP joint. The bones are diffusely osteopenic. IMPRESSION: Stable radiographs of the right foot. No evidence of acute fracture or malalignment. The fifth digit is absent and there is chronic lateral subluxation/dislocation of the second, third and fourth digits. Electronically Signed   By: Jacqulynn Cadet M.D.   On: 06/27/2019 17:41    Recent Labs: Lab Results  Component Value Date   WBC 4.9 06/27/2019   HGB 11.1 (L) 06/27/2019   PLT 205 06/27/2019   NA 139 06/27/2019   K 4.6 06/27/2019   CL 103 06/27/2019   CO2 26 06/27/2019   GLUCOSE 74 06/27/2019   BUN 16 06/27/2019   CREATININE 0.70 06/27/2019   BILITOT 0.7 11/16/2018   ALKPHOS 60 06/16/2017   AST 26 11/16/2018   ALT 10 11/16/2018   PROT 7.5 11/16/2018   ALBUMIN 4.3 06/16/2017   CALCIUM 9.9 06/27/2019   GFRAA >60 06/27/2019    Speciality Comments: No specialty comments available.   Procedures:  No procedures performed Allergies: Patient has no known allergies.   Assessment / Plan:     Visit Diagnoses: No diagnosis found.  Orders: No orders of the defined types were placed in this encounter.  No orders of the defined types were placed in this encounter.   Face-to-face time spent with patient was *** minutes. Greater than 50% of time was spent in counseling and coordination of care.  Follow-Up Instructions: No follow-ups on file.   Earnestine Mealing, CMA  Note - This record has been created using Editor, commissioning.  Chart creation errors have been sought, but may not always  have been located. Such creation errors do not reflect on  the standard of medical care.

## 2019-07-18 NOTE — Progress Notes (Deleted)
Office Visit Note  Patient: [name]             Date of Birth: [birth year]           MRN: [id number]             PCP: Georgann Housekeeper, MD Referring: Georgann Housekeeper, MD Visit Date: 07/26/2019 Occupation: @GUAROCC @  Subjective:  No chief complaint on file.   History of Present Illness: [name] is a 78 y.o. female ***   Activities of Daily Living:  Patient reports morning stiffness for *** {minute/hour:19697}.   Patient {ACTIONS;DENIES/REPORTS:21021675::"Denies"} nocturnal pain.  Difficulty dressing/grooming: {ACTIONS;DENIES/REPORTS:21021675::"Denies"} Difficulty climbing stairs: {ACTIONS;DENIES/REPORTS:21021675::"Denies"} Difficulty getting out of chair: {ACTIONS;DENIES/REPORTS:21021675::"Denies"} Difficulty using hands for taps, buttons, cutlery, and/or writing: {ACTIONS;DENIES/REPORTS:21021675::"Denies"}  No Rheumatology ROS completed.   PMFS History:  Patient Active Problem List   Diagnosis Date Noted  . Post-operative state 08/04/2018  . Complication of anesthesia   . Lumbar spondylosis 04/13/2018  . Chronic pain syndrome 03/08/2018  . Degeneration of lumbosacral intervertebral disc 11/28/2017  . Rheumatoid arthritis (HCC) 09/20/2017  . Language barrier 06/09/2017  . History of Pulmonary nodulosis  06/09/2017  . 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 High risk medication use 03/07/2017  . History of gastroesophageal reflux (GERD) 03/07/2017  . Kyphosis 03/07/2017  . Idiopathic scoliosis 03/07/2017  . Age-related osteoporosis without current pathological fracture 03/07/2017  . 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 Pulmonary nodules 03/07/2017  . Dermatitis 09/09/2016  . 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 Arthritis    RA, sees  Dr.  09/28/2015  . Cataracts, bilateral    hx of  . Chronic back pain   . Complication of anesthesia   . GERD (gastroesophageal reflux disease)   . Headache(784.0)   . PONV (postoperative nausea and vomiting)     No family history on file. Past Surgical History:  Procedure Laterality Date  . EYE SURGERY     cataract bilateral  . FINGER ARTHRODESIS  06/06/2012   Procedure: ARTHRODESIS FINGER;  Surgeon: 08/06/2012, MD;  Location: [facility] OR;  Service: Orthopedics;  Laterality: Right;  RIGHT SMALL FINGER AND RING FINGER PIP FUSION WITH AUTOLOGOUS GRAFT FROM RIGHT WRIST  . FINGER ARTHROPLASTY Left 09/26/2015   Procedure: LEFT HAND METACARPOPHALANGEAL ARTHROPLASTY INDEX MIDDLE RING AND SMALL FINGERS WITH REALIGNMENT OF ;  Surgeon: 09/28/2015, MD;  Location: [facility] OR;  Service: Orthopedics;  Laterality: Left;  . FINGER SURGERY Right 09/20/2017   screws in 4th and 5th digit  . FOOT SURGERY    . FRACTURE SURGERY     under right eye  . HAND SURGERY    . HARDWARE REMOVAL Right 09/20/2017   Procedure: Right hand hardware removal  ring and small fingers;  Surgeon: 09/22/2017, MD;  Location: [facility] OR;  Service: Orthopedics;  Laterality: Right;  60 mins  . MULTIPLE EXTRACTIONS WITH ALVEOLOPLASTY N/A 08/04/2018   Procedure: MULTIPLE EXTRACTION;  Surgeon: 10/04/2018, DDS;  Location: [facility] OR;  Service: Oral Surgery;  Laterality: N/A;  . PROXIMAL INTERPHALANGEAL FUSION (PIP) Left 04/22/2016   Procedure: PROXIMAL INTERPHALANGEAL FUSION (PIP) Index, middle, and ring;  Surgeon: 04/24/2016, MD;  Location: [facility] OR;  Service: Orthopedics;  Laterality: Left;  . REPAIR EXTENSOR TENDON Left 09/26/2015   Procedure: EXTENSOR TENDON;  Surgeon: 09/28/2015, MD;  Location: [facility];  Service: Orthopedics;  Laterality: Left;   Social History   Social History Narrative  . Not on file   Immunization History  Administered Date(s) Administered  . Influenza,inj,quad, With Preservative 08/01/2017     Objective:  Vital Signs: There were no vitals taken for this visit.   Physical Exam   Musculoskeletal Exam: ***  CDAI Exam: CDAI Score: - Patient Global: -; Provider Global: - Swollen: -; Tender: - Joint Exam   No joint exam has been documented for this visit   There is currently no information documented on the homunculus. Go to the Rheumatology activity and complete the homunculus joint exam.  Investigation: No additional findings.  Imaging: Dg Foot Complete Right  Result Date: 06/27/2019 CLINICAL DATA:  78 year old female with right foot pain since falling 5 days ago EXAM: RIGHT FOOT COMPLETE - 3+ VIEW COMPARISON:  Prior radiographs of the right foot 12/20/2018 FINDINGS: No evidence of acute fracture or malalignment. Stable appearance of the digits with prior amputation of the fifth digit and complete lateral dislocation of the fourth MTP joint. Lateral subluxation of the second and third MTP joints also unchanged. There is midfoot osteoarthritis primarily at the talonavicular joint. Degenerative osteoarthritis also present at the great toe MTP joint. The bones are diffusely osteopenic. IMPRESSION: Stable radiographs of the right foot. No evidence of acute fracture or malalignment. The fifth digit is absent and there is chronic lateral subluxation/dislocation of the second, third and fourth digits. Electronically Signed   By: Jacqulynn Cadet M.D.   On: 06/27/2019 17:41    Recent Labs: Lab Results  Component Value Date   WBC 4.9 06/27/2019   HGB 11.1 (L) 06/27/2019   PLT 205 06/27/2019   NA 139 06/27/2019   K 4.6 06/27/2019   CL 103 06/27/2019   CO2 26 06/27/2019   GLUCOSE 74 06/27/2019   BUN 16 06/27/2019   CREATININE 0.70 06/27/2019   BILITOT 0.7 11/16/2018   ALKPHOS 60 06/16/2017   AST 26 11/16/2018   ALT 10 11/16/2018   PROT 7.5 11/16/2018   ALBUMIN 4.3 06/16/2017   CALCIUM 9.9 06/27/2019   GFRAA >60 06/27/2019    Speciality Comments: No specialty comments available.   Procedures:  No procedures performed Allergies: Patient has no known allergies.   Assessment / Plan:     Visit Diagnoses: No diagnosis found.  Orders: No orders of the defined types were placed in this encounter.  No orders of the defined types were placed in this encounter.   Face-to-face time spent with patient was *** minutes. Greater than 50% of time was spent in counseling and coordination of care.  Follow-Up Instructions: No follow-ups on file.   Ofilia Neas, PA-C  Note - This record has been created using Dragon software.  Chart creation errors have been sought, but may not always  have been located. Such creation errors do not reflect on  the standard of medical care.

## 2019-07-19 ENCOUNTER — Ambulatory Visit: Payer: Self-pay | Admitting: Rheumatology

## 2019-07-26 ENCOUNTER — Other Ambulatory Visit: Payer: Self-pay | Admitting: Rheumatology

## 2019-07-26 ENCOUNTER — Ambulatory Visit: Payer: Medicare Other | Admitting: Rheumatology

## 2019-07-26 NOTE — Progress Notes (Signed)
Office Visit Note  Patient: [name]             Date of Birth: [birth year]           MRN: [id number]             PCP: Georgann Housekeeper, MD Referring: Georgann Housekeeper, MD Visit Date: 08/02/2019 Occupation: @GUAROCC @  Subjective:  Pain in both hands and both feet    History of Present Illness: [name] is a 78 y.o. female seropositive rheumatoid arthritis and osteoporosis.  Patient reports she needs refills of SSZ and MTX.  She was previously taking MTX 4 tablets po once weekly and SSZ 500 mg 1 tablet BID. She reports she is having pain in both hands and both feet.  She has been having left ankle joint swelling for the past 1 week. She continues to take Fosamax 70 mg 1 tablet by mouth once weekly. Patient was evaluated in the emergency department on 06/27/2019 for symptoms of neuropathy bilaterally.  She was given a prescription for gabapentin and Lortab.  She states that she experienced dizziness and lightheadedness when taking gabapentin so she discontinued it.  Activities of Daily Living:  Patient reports joint stiffness all day  Patient Reports nocturnal pain.  Difficulty dressing/grooming: Reports Difficulty climbing stairs: Reports Difficulty getting out of chair: Reports Difficulty using hands for taps, buttons, cutlery, and/or writing: Reports  Review of Systems  Constitutional: Positive for fatigue.  HENT: Negative for mouth sores, mouth dryness and nose dryness.   Eyes: Positive for visual disturbance and dryness. Negative for pain.  Respiratory: Negative for cough, hemoptysis, shortness of breath, wheezing and difficulty breathing.   Cardiovascular: Negative for chest pain, palpitations, hypertension and swelling in legs/feet.  Gastrointestinal: Negative for blood in stool, constipation and diarrhea.  Endocrine: Negative for increased urination.  Genitourinary: Negative for difficulty urinating and painful urination.  Musculoskeletal: Positive for  arthralgias, joint pain, joint swelling and morning stiffness. Negative for myalgias, muscle weakness, muscle tenderness and myalgias.  Skin: Negative for color change, pallor, rash, hair loss, nodules/bumps, skin tightness, ulcers and sensitivity to sunlight.  Allergic/Immunologic: Negative for susceptible to infections.  Neurological: Negative for numbness.  Hematological: Negative for swollen glands.  Psychiatric/Behavioral: Positive for sleep disturbance. Negative for depressed mood. The patient is not nervous/anxious.     PMFS History:  Patient Active Problem List   Diagnosis Date Noted  . Post-operative state 08/04/2018  . Complication of anesthesia   . Lumbar spondylosis 04/13/2018  . Chronic pain syndrome 03/08/2018  . Degeneration of lumbosacral intervertebral disc 11/28/2017  . Rheumatoid arthritis (HCC) 09/20/2017  . Language barrier 06/09/2017  . History of Pulmonary nodulosis  06/09/2017  . 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 High risk medication use 03/07/2017  . History of gastroesophageal reflux (GERD) 03/07/2017  . Kyphosis 03/07/2017  . Idiopathic scoliosis 03/07/2017  . Age-related osteoporosis without current pathological fracture 03/07/2017  . 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 Pulmonary nodules 03/07/2017  . Dermatitis 09/09/2016  . 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 Arthritis    RA, sees Dr.  Ethelene Hal  . Cataracts, bilateral    hx of  . Chronic back pain   . Complication of anesthesia   . GERD (gastroesophageal reflux disease)   . Headache(784.0)   .  PONV (postoperative nausea and vomiting)     History reviewed. No pertinent family history. Past Surgical History:  Procedure Laterality Date  . EYE SURGERY     cataract bilateral   . FINGER ARTHRODESIS  06/06/2012   Procedure: ARTHRODESIS FINGER;  Surgeon: Dominica Severin, MD;  Location: [facility] OR;  Service: Orthopedics;  Laterality: Right;  RIGHT SMALL FINGER AND RING FINGER PIP FUSION WITH AUTOLOGOUS GRAFT FROM RIGHT WRIST  . FINGER ARTHROPLASTY Left 09/26/2015   Procedure: LEFT HAND METACARPOPHALANGEAL ARTHROPLASTY INDEX MIDDLE RING AND SMALL FINGERS WITH REALIGNMENT OF ;  Surgeon: Dominica Severin, MD;  Location: [facility] OR;  Service: Orthopedics;  Laterality: Left;  . FINGER SURGERY Right 09/20/2017   screws in 4th and 5th digit  . FOOT SURGERY    . FRACTURE SURGERY     under right eye  . HAND SURGERY    . HARDWARE REMOVAL Right 09/20/2017   Procedure: Right hand hardware removal  ring and small fingers;  Surgeon: Dominica Severin, MD;  Location: [facility] OR;  Service: Orthopedics;  Laterality: Right;  60 mins  . MULTIPLE EXTRACTIONS WITH ALVEOLOPLASTY N/A 08/04/2018   Procedure: MULTIPLE EXTRACTION;  Surgeon: Ocie Doyne, DDS;  [facility] OR;  Service: Oral Surgery;  Laterality: N/A;  . PROXIMAL INTERPHALANGEAL FUSION (PIP) Left 04/22/2016   Procedure: PROXIMAL INTERPHALANGEAL FUSION (PIP) Index, middle, and ring;  Surgeon: Dominica Severin, MD;  Location: [facility] OR;  Service: Orthopedics;  Laterality: Left;  . REPAIR EXTENSOR TENDON Left 09/26/2015   Procedure: EXTENSOR TENDON;  Surgeon: Dominica Severin, MD;  Location: [facility] OR;  Service: Orthopedics;  Laterality: Left;   Social History   Social History Narrative  . Not on file   Immunization History  Administered Date(s) Administered  . Influenza,inj,quad, With Preservative 08/01/2017     Objective: Vital Signs: BP 104/60 (BP Location: Left Arm, Patient Position: Sitting, Cuff Size: Small)   Pulse 69   Resp 11   Ht 4\' 6"  (1.372 m) Comment: patient attempted to stand straight  Wt 77 lb (34.9 kg)   BMI 18.57 kg/m    Physical Exam Vitals signs and nursing note reviewed.  Constitutional:      Appearance: She is well-developed.   HENT:     Head: Normocephalic and atraumatic.  Eyes:     Conjunctiva/sclera: Conjunctivae normal.  Neck:     Musculoskeletal: Normal range of motion.  Cardiovascular:     Rate and Rhythm: Normal rate and regular rhythm.     Heart sounds: Normal heart sounds.  Pulmonary:     Effort: Pulmonary effort is normal.     Breath sounds: Normal breath sounds.  Abdominal:     General: Bowel sounds are normal.     Palpations: Abdomen is soft.  Lymphadenopathy:     Cervical: No cervical adenopathy.  Skin:    General: Skin is warm and dry.     Capillary Refill: Capillary refill takes less than 2 seconds.  Neurological:     Mental Status: She is alert and oriented to person, place, and time.  Psychiatric:        Behavior: Behavior normal.      Musculoskeletal Exam: C-spine limited ROM with discomfort.  Severe thoracic kyphosis and scoliosis.  Shoulder joints, elbow joints, and wrist joints good ROM.  No tenderness or synovitis of MCPs, PIPs, and DIPs good ROM.  Knee joints good ROM with no discomfort.  No warmth or effusion of knee joints.   Left ankle joint tenderness and swelling.  CDAI Exam: CDAI Score: - Patient Global: -; Provider Global: - Swollen: 1 ; Tender: 1  Joint Exam      Right  Left  Ankle     Swollen Tender     Investigation: No additional findings.  Imaging: No results found.  Recent Labs: Lab Results  Component Value Date   WBC 4.9 06/27/2019   HGB 11.1 (L) 06/27/2019   PLT 205 06/27/2019   NA 139 06/27/2019   K 4.6 06/27/2019   CL 103 06/27/2019   CO2 26 06/27/2019   GLUCOSE 74 06/27/2019   BUN 16 06/27/2019   CREATININE 0.70 06/27/2019   BILITOT 0.7 11/16/2018   ALKPHOS 60 06/16/2017   AST 26 11/16/2018   ALT 10 11/16/2018   PROT 7.5 11/16/2018   ALBUMIN 4.3 06/16/2017   CALCIUM 9.9 06/27/2019   GFRAA >60 06/27/2019    Speciality Comments: No specialty comments available.  Procedures:  No procedures performed Allergies: Patient has  no known allergies.     Assessment / Plan:     Visit Diagnoses: Rheumatoid arthritis involving both hands with positive rheumatoid factor (HCC) - Severe end-stage rheumatoid arthritis:  She presents today with pain in both hands and both. She has tenderness and mild swelling of the left ankle joint, which started 1 week ago.  She ran out of MTX and SSZ recently, so she needs refills of MTX 4 tablets once weekly and sulfasalazine 500 mg 1 tablet by mouth BID.  She has been having increased pain in multiple joints since missing doses of MTX and SSZ.  She will continue on this current regimen.  She will follow up in 5 months.  High risk medication use - Methotrexate 4 tablets by mouth once weekly, folic acid 1 mg po daily, sulfasalazine 500 mg 1 tablet by mouth twice daily. Most recent CBC/CMP within normal limits except for low hemoglobin but stable on 06/27/2019.  Age-related osteoporosis without current pathological fracture -She has thoracic kyphosis and scoliosis. She is on Fosamax 70 mg weekly prescribed by Dr. Eula ListenHussain along with calcium/vitamin D supplement daily.  No DEXA on file.  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  She is planning on scheduling a eye exam at Dr. Newt LukesShaprio's office.   Other medical conditions are listed as follows:   History of Pulmonary nodulosis   Vitamin D deficiency  History of gastroesophageal reflux (GERD)  History of scoliosis  Language barrier  Orders: No orders of the defined types were placed in this encounter.  Meds ordered this encounter  Medications  . methotrexate (RHEUMATREX) 2.5 MG tablet    Sig: TAKE 4 TABLETS (10 MG TOTAL) BY MOUTH ONCE A WEEK.    Dispense:  48 tablet    Refill:  0  . sulfaSALAzine (AZULFIDINE) 500 MG tablet    Sig: TAKE ONE TABLET BY MOUTH TWICE A DAY    Dispense:  180 tablet    Refill:  0       Follow-Up Instructions: Return in about 6 months (around 01/31/2020) for Rheumatoid arthritis, Osteoporosis.   Gearldine Bienenstockaylor M Tifini Reeder, PA-C   I examined and evaluated the patient with Sherron Alesaylor Ahnya Akre PA.  Patient states that she has been off methotrexate for few months.  She lives that she is taking sulfasalazine.  She was complaining of increased discomfort in her hands, feet and ankles.  On examination she had lot of synovial thickening  only joint which was inflamed on her left ankle joint.  She had recent labs in the emergency room which we reviewed.  We will refill  methotrexate today.  The plan of care was discussed as noted above.  Bo Merino, MD  Note - This record has been created using Editor, commissioning.  Chart creation errors have been sought, but may not always  have been located. Such creation errors do not reflect on  the standard of medical care.

## 2019-07-26 NOTE — Telephone Encounter (Signed)
Last Visit:02/15/2019 Next Visit:08/02/2019 Labs: 06/27/19 RBC 3.60 Hgb 11.1 Hct 34.2   Okay to refill per Dr. Estanislado Pandy

## 2019-08-02 ENCOUNTER — Other Ambulatory Visit: Payer: Self-pay

## 2019-08-02 ENCOUNTER — Encounter: Payer: Self-pay | Admitting: Rheumatology

## 2019-08-02 ENCOUNTER — Ambulatory Visit (INDEPENDENT_AMBULATORY_CARE_PROVIDER_SITE_OTHER): Payer: Medicare Other | Admitting: Rheumatology

## 2019-08-02 VITALS — BP 104/60 | HR 69 | Resp 11 | Ht <= 58 in | Wt 77.0 lb

## 2019-08-02 DIAGNOSIS — E559 Vitamin D deficiency, unspecified: Secondary | ICD-10-CM

## 2019-08-02 DIAGNOSIS — Z87898 Personal history of other specified conditions: Secondary | ICD-10-CM

## 2019-08-02 DIAGNOSIS — T372X5A Adverse effect of antimalarials and drugs acting on other blood protozoa, initial encounter: Secondary | ICD-10-CM

## 2019-08-02 DIAGNOSIS — Z8719 Personal history of other diseases of the digestive system: Secondary | ICD-10-CM

## 2019-08-02 DIAGNOSIS — R7612 Nonspecific reaction to cell mediated immunity measurement of gamma interferon antigen response without active tuberculosis: Secondary | ICD-10-CM

## 2019-08-02 DIAGNOSIS — Z789 Other specified health status: Secondary | ICD-10-CM

## 2019-08-02 DIAGNOSIS — Z79899 Other long term (current) drug therapy: Secondary | ICD-10-CM | POA: Diagnosis not present

## 2019-08-02 DIAGNOSIS — M05742 Rheumatoid arthritis with rheumatoid factor of left hand without organ or systems involvement: Secondary | ICD-10-CM

## 2019-08-02 DIAGNOSIS — Z8739 Personal history of other diseases of the musculoskeletal system and connective tissue: Secondary | ICD-10-CM

## 2019-08-02 DIAGNOSIS — M810 Age-related osteoporosis without current pathological fracture: Secondary | ICD-10-CM

## 2019-08-02 DIAGNOSIS — M05741 Rheumatoid arthritis with rheumatoid factor of right hand without organ or systems involvement: Secondary | ICD-10-CM | POA: Diagnosis not present

## 2019-08-02 DIAGNOSIS — H35389 Toxic maculopathy, unspecified eye: Secondary | ICD-10-CM

## 2019-08-02 MED ORDER — SULFASALAZINE 500 MG PO TABS: ORAL_TABLET | ORAL | 0 refills | Status: DC

## 2019-08-02 MED ORDER — METHOTREXATE 2.5 MG PO TABS: ORAL_TABLET | ORAL | 0 refills | Status: DC

## 2019-08-02 NOTE — Patient Instructions (Signed)
Standing Labs We placed an order today for your standing lab work.    Please come back and get your standing labs in November and every 3 months  We have open lab daily Monday through Thursday from 8:30-12:30 PM and 1:30-4:30 PM and Friday from 8:30-12:30 PM and 1:30-4:00 PM at the office of Dr. Shaili Deveshwar.   You may experience shorter wait times on Monday and Friday afternoons. The office is located at [address], [address], [place], [place] [address] No appointment is necessary.   Labs are drawn by [facility].  You may receive a bill from [facility] for your lab work.  If you wish to have your labs drawn at another location, please call the office 24 hours in advance to send orders.  If you have any questions regarding directions or hours of operation,  please call [phone].   Just as a reminder please drink plenty of water prior to coming for your lab work. Thanks!  

## 2019-08-09 ENCOUNTER — Other Ambulatory Visit: Payer: Self-pay | Admitting: Rheumatology

## 2019-08-09 NOTE — Telephone Encounter (Signed)
Last visit: 08/02/19 Next Visit: 01/31/20  Okay to refill per Dr. Estanislado Pandy

## 2019-10-08 ENCOUNTER — Other Ambulatory Visit: Payer: Self-pay

## 2019-10-08 ENCOUNTER — Ambulatory Visit (INDEPENDENT_AMBULATORY_CARE_PROVIDER_SITE_OTHER): Payer: Medicare Other | Admitting: Podiatry

## 2019-10-08 DIAGNOSIS — M659 Synovitis and tenosynovitis, unspecified: Secondary | ICD-10-CM | POA: Diagnosis not present

## 2019-10-08 DIAGNOSIS — M05772 Rheumatoid arthritis with rheumatoid factor of left ankle and foot without organ or systems involvement: Secondary | ICD-10-CM | POA: Diagnosis not present

## 2019-10-08 DIAGNOSIS — M05771 Rheumatoid arthritis with rheumatoid factor of right ankle and foot without organ or systems involvement: Secondary | ICD-10-CM | POA: Diagnosis not present

## 2019-10-10 NOTE — Progress Notes (Signed)
   HPI: 78 year old female presenting today for follow up evaluation of bilateral foot and ankle pain. She notes the pain is diffusely located in both feet and ankles, mostly in the plantar forefoot. She reports some intermittent pain in the bilateral lower extremities that began about two weeks ago. She has been taking Tramadol for pain. Being on the feet makes the symptoms worse. Patient is here for further evaluation and treatment.   Past Medical History:  Diagnosis Date  . Arthritis    RA, sees Dr.  Nelva Bush  . Cataracts, bilateral    hx of  . Chronic back pain   . Complication of anesthesia   . GERD (gastroesophageal reflux disease)   . Headache(784.0)   . PONV (postoperative nausea and vomiting)      Physical Exam: General: The patient is alert and oriented x3 in no acute distress.  Dermatology: Skin is warm, dry and supple bilateral lower extremities. Negative for open lesions or macerations.  Vascular: Left lower extremity discoloration noted consistent with venous insufficiency. Palpable pedal pulses bilaterally. No edema or erythema noted. Capillary refill within normal limits.  Neurological: Epicritic and protective threshold grossly intact bilaterally.   Musculoskeletal Exam: Pain with palpation noted to the left proximal Achilles with hard nodular areas present. Range of motion within normal limits to all pedal and ankle joints bilateral. Muscle strength 5/5 in all groups bilateral.   Assessment: 1. LLE discoloration consistent with venous insufficiency  2. Rheumatoid arthritis  3. Diffuse bilateral foot and ankle pain 4. Ankle synovitis / DJD bilateral   Plan of Care:  1. Patient evaluated.  2. Continue taking Tramadol 50 mg as needed pain  3. Injection of 0.5 mLs Celestone Soluspan injected into the bilateral ankle joints.  4. Recommend continued follow-up with rheumatology and PCP.  5. Return to clinic as needed.      Edrick Kins, DPM [facility]  Dr. Edrick Kins, DPM    [address], [place] [address]                Office [phone]  Fax [phone]

## 2019-12-14 ENCOUNTER — Telehealth: Payer: Self-pay

## 2019-12-14 ENCOUNTER — Telehealth: Payer: Self-pay | Admitting: Rheumatology

## 2019-12-14 ENCOUNTER — Other Ambulatory Visit: Payer: Self-pay | Admitting: Physician Assistant

## 2019-12-14 MED ORDER — METHOTREXATE 2.5 MG PO TABS: ORAL_TABLET | ORAL | 0 refills | Status: DC

## 2019-12-14 NOTE — Telephone Encounter (Signed)
Patient called requesting prescription refill of Methotrexate to be sent to [facility].

## 2019-12-14 NOTE — Telephone Encounter (Signed)
Last Visit: 08/02/2019 Next Visit: 01/31/2020 Labs: 06/27/2019   Spoke with patient and advised patient she is due to update labs prior to a refill. Patient states she has had labs drawn at Dr. Venita Sheffield office. I have left a message for Dr. Venita Sheffield nurse to fax the results to our office.

## 2019-12-14 NOTE — Telephone Encounter (Signed)
Labs: 11/21/2019   Okay to refill per Dr. Corliss Skains.

## 2019-12-14 NOTE — Telephone Encounter (Signed)
Labs received via fax from Dr. Venita Sheffield office.  11/21/2019  CBC, CMP, TSH, lipid panel  RBC 3.58, HGB 11.0, HCT 33.1, RDW 16.1, cholesterol 212, LDL 112.  Labs reviewed by Sherron Ales, PA-C. Will send to scan center.

## 2020-01-07 ENCOUNTER — Encounter (INDEPENDENT_AMBULATORY_CARE_PROVIDER_SITE_OTHER): Payer: Medicare Other | Admitting: Ophthalmology

## 2020-01-15 ENCOUNTER — Telehealth (HOSPITAL_COMMUNITY): Payer: Self-pay

## 2020-01-15 NOTE — Telephone Encounter (Signed)
The above patient or their representative was contacted and gave the following answers to these questions:         Do you have any of the following symptoms?    NO  Fever                    Cough                   Shortness of breath  Do  you have any of the following other symptoms?    muscle pain         vomiting,        diarrhea        rash         weakness        red eye        abdominal pain         bruising          bruising or bleeding              joint pain           severe headache    Have you been in contact with someone who was or has been sick in the past 2 weeks?  NO  Yes                 Unsure                         Unable to assess   Does the person that you were in contact with have any of the following symptoms?   Cough         shortness of breath           muscle pain         vomiting,            diarrhea            rash            weakness           fever            red eye           abdominal pain           bruising  or  bleeding                joint pain                severe headache                 COMMENTS OR ACTION PLAN FOR THIS PATIENT:        ALL QUESTIONS WERE ASKED/[facility]

## 2020-01-16 ENCOUNTER — Other Ambulatory Visit: Payer: Self-pay

## 2020-01-16 ENCOUNTER — Ambulatory Visit (INDEPENDENT_AMBULATORY_CARE_PROVIDER_SITE_OTHER): Payer: Medicare Other | Admitting: Vascular Surgery

## 2020-01-16 ENCOUNTER — Encounter: Payer: Self-pay | Admitting: Vascular Surgery

## 2020-01-16 ENCOUNTER — Ambulatory Visit (HOSPITAL_COMMUNITY)
Admission: RE | Admit: 2020-01-16 | Discharge: 2020-01-16 | Disposition: A | Discharge status: Home / Self Care | Payer: Medicare Other | Source: Ambulatory Visit | Attending: Vascular Surgery | Admitting: Vascular Surgery

## 2020-01-16 VITALS — BP 118/79 | HR 93 | Temp 97.9°F | Resp 18 | Ht <= 58 in | Wt 77.0 lb

## 2020-01-16 DIAGNOSIS — I872 Venous insufficiency (chronic) (peripheral): Secondary | ICD-10-CM | POA: Diagnosis not present

## 2020-01-16 DIAGNOSIS — I739 Peripheral vascular disease, unspecified: Secondary | ICD-10-CM

## 2020-01-16 DIAGNOSIS — I825Z2 Chronic embolism and thrombosis of unspecified deep veins of left distal lower extremity: Secondary | ICD-10-CM

## 2020-01-16 NOTE — Progress Notes (Signed)
Patient name: [name] MRN: [id number] DOB: 1941/06/19 Sex: female  REASON FOR VISIT:   Follow-up of venous insufficiency.  HPI:   [name] is a pleasant 79 y.o. female who I last saw on 11/08/2018.  I had originally seen her in consultation in 2019 with left calf pain.  She come to the hospital for a tooth extraction and postoperatively was complaining of calf pain.  Ultrasound showed evidence of DVT.  This was felt to be acute.  She was on Eliquis for 3 months.  Follow-up duplex scan at that time showed complete resolution of her clot.  She comes in for routine follow-up visit.  Since I saw her last she does say that she had some bleeding from her left leg but cannot remember exactly the details of this.  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  Her activity is fairly limited because of her arthritis.  But through the translator I do not get any clear-cut history of claudication, rest pain, or nonhealing ulcers.  She does try to elevate her legs some.  She does not wear compression stockings.  Past Medical History:  Diagnosis Date  . Arthritis    RA, sees Dr.  Nelva Bush  . Cataracts, bilateral    hx of  . Chronic back pain   . Complication of anesthesia   . GERD (gastroesophageal reflux disease)   . Headache(784.0)   . PONV (postoperative nausea and vomiting)     History reviewed. No pertinent family history.  SOCIAL HISTORY: Social History   Tobacco Use  . Smoking status: Never Smoker  . Smokeless tobacco: Never Used  Substance Use Topics  . Alcohol use: No    No Known Allergies  Current Outpatient Medications  Medication Sig Dispense Refill  . apixaban (ELIQUIS) 5 MG TABS tablet Eliquis 5 mg tablet    . Calcium Carbonate-Vitamin D (CALCIUM 600+D) 600-400 MG-UNIT tablet Take 1 tablet by mouth 2 (two) times daily.    . diclofenac sodium (VOLTAREN) 1 % GEL USE TWO TO FOUR GRAMS TO AFFECTED JOINTS UP TO FOUR  TIMES A DAY 400 g 1  . HYDROcodone-acetaminophen (NORCO/VICODIN) 5-325 MG tablet Take 1 tablet by mouth every 4 (four) hours as needed. 10 tablet 0  . methotrexate (RHEUMATREX) 2.5 MG tablet TAKE 4 TABLETS (10 MG TOTAL) BY MOUTH ONCE A WEEK. 48 tablet 0  . prednisoLONE acetate (PRED FORTE) 1 % ophthalmic suspension prednisolone acetate 1 % eye drops,suspension    . sulfaSALAzine (AZULFIDINE) 500 MG tablet TAKE ONE TABLET BY MOUTH TWICE A DAY 180 tablet 0  . tobramycin (TOBREX) 0.3 % ophthalmic solution tobramycin 0.3 % eye drops    . traMADol (ULTRAM) 50 MG tablet Take 1 tablet (50 mg total) by mouth 3 (three) times daily as needed (pain). 90 tablet 0  . Vitamin D, Ergocalciferol, (DRISDOL) 50000 units CAPS capsule TAKE ONE CAPSULE BY MOUTH PER MONTH (Patient taking differently: Take 50,000 Units by mouth every Saturday. ) 6 capsule 0  . folic acid (FOLVITE) 1 MG tablet Take 1 tablet (1 mg total) by mouth daily. 90 tablet 4   No current facility-administered medications for this visit.    REVIEW OF SYSTEMS:  [X]  denotes positive finding, [ ]  denotes negative finding Cardiac  Comments:  Chest pain or chest pressure:    Shortness of breath upon exertion:    Short of breath when lying flat:  Irregular heart rhythm:        Vascular    Pain in calf, thigh, or hip brought on by ambulation:    Pain in feet at night that wakes you up from your sleep:     Blood clot in your veins:    Leg swelling:         Pulmonary    Oxygen at home:    Productive cough:     Wheezing:         Neurologic    Sudden weakness in arms or legs:     Sudden numbness in arms or legs:     Sudden onset of difficulty speaking or slurred speech:    Temporary loss of vision in one eye:     Problems with dizziness:         Gastrointestinal    Blood in stool:     Vomited blood:         Genitourinary    Burning when urinating:     Blood in urine:        Psychiatric    Major depression:         Hematologic     Bleeding problems:    Problems with blood clotting too easily:        Skin    Rashes or ulcers:        Constitutional    Fever or chills:     PHYSICAL EXAM:   Vitals:   01/16/20 1346  BP: 118/79  Pulse: 93  Resp: 18  Temp: 97.9 F (36.6 C)  SpO2: 100%  Weight: 77 lb (34.9 kg)  Height: 4\' 6"  (1.372 m)    GENERAL: The patient is a well-nourished female, in no acute distress. The vital signs are documented above. CARDIAC: There is a regular rate and rhythm.  VASCULAR: I do not detect carotid bruits. I cannot palpate pedal pulses however both feet are warm and well perfused. She has hyperpigmentation bilaterally. She has telangiectasias in both lower legs and feet. She has no open ulcers. She has no lower extremity swelling currently. PULMONARY: There is good air exchange bilaterally without wheezing or rales. ABDOMEN: Soft and non-tender with normal pitched bowel sounds.  MUSCULOSKELETAL: There are no major deformities or cyanosis. NEUROLOGIC: No focal weakness or paresthesias are detected. SKIN: There are no ulcers or rashes noted. PSYCHIATRIC: The patient has a normal affect.  DATA:    ARTERIAL DOPPLER STUDY: I have independently interpreted her arterial Doppler study today.  On the right side there is a monophasic posterior tibial signal with a triphasic dorsalis pedis signal.  ABI is 98%.  Toe pressure 79 mmHg.  On the left side there is a triphasic posterior tibial signal with a biphasic dorsalis pedis signal.  ABI is 100%.  Toe pressure is 85 mmHg.  MEDICAL ISSUES:   CHRONIC VENOUS INSUFFICIENCY: The patient has a history of a previous DVT which resolved with 3 months of anticoagulation.  Her physical exam does suggest significant chronic venous insufficiency.  She may have potentially had a bleeding episode related to varicose veins in her left leg but the details are not clear.  When she returns in 1 year for her yearly follow-up visit I would like to get formal  venous reflux testing.  I have encouraged her to elevate her legs.  I encouraged her to keep her skin well lubricated.  PERIPHERAL VASCULAR DISEASE: Based on her noninvasive studies she has evidence of mild peripheral vascular disease bilaterally.  However because of her limited activity she is essentially asymptomatic.  I do not think we need routine follow-up arterial studies unless she developed a nonhealing wound.  I will plan on seeing her back in 1 year.  She knows to call sooner if she has problems.  Waverly Ferrari [facility] [phone]

## 2020-01-17 ENCOUNTER — Other Ambulatory Visit: Payer: Self-pay | Admitting: *Deleted

## 2020-01-17 DIAGNOSIS — I872 Venous insufficiency (chronic) (peripheral): Secondary | ICD-10-CM

## 2020-01-28 NOTE — Progress Notes (Signed)
Office Visit Note  Patient: [name]             Date of Birth: [birth year]           MRN: [id number]             PCP: Georgann Housekeeper, MD Referring: Georgann Housekeeper, MD Visit Date: 01/31/2020 Occupation: @GUAROCC @  Subjective:  Increased pain in joints.   History of Present Illness: [name] is a 79 y.o. female with history of seropositive rheumatoid arthritis.  She states she has been having increased pain in her hands and feet.  She states she has been taking her medications on a regular basis.  She started having swelling in her bilateral hands and her left ankle about 15 days ago.  She also noticed an sore on her left ankle joint.  She states she has been having lot of discomfort in her joints.  Activities of Daily Living:  Patient reports morning stiffness for 0 minute.   Patient Reports nocturnal pain.  Difficulty dressing/grooming: Reports Difficulty climbing stairs: Reports Difficulty getting out of chair: Reports Difficulty using hands for taps, buttons, cutlery, and/or writing: Reports  Review of Systems  Constitutional: Positive for fatigue. Negative for night sweats, weight gain and weight loss.  HENT: Negative for mouth sores, trouble swallowing, trouble swallowing, mouth dryness and nose dryness.   Eyes: Negative for pain, redness, visual disturbance and dryness.  Respiratory: Negative for cough, shortness of breath and difficulty breathing.   Cardiovascular: Positive for swelling in legs/feet. Negative for chest pain, palpitations, hypertension and irregular heartbeat.  Gastrointestinal: Negative for blood in stool, constipation and diarrhea.  Endocrine: Negative for increased urination.  Genitourinary: Negative for vaginal dryness.  Musculoskeletal: Positive for arthralgias, joint pain, joint swelling, myalgias, morning stiffness and myalgias. Negative for muscle weakness and muscle tenderness.  Skin: Negative for color change, rash, hair loss, skin  tightness, ulcers and sensitivity to sunlight.  Allergic/Immunologic: Negative for susceptible to infections.  Neurological: Negative for dizziness, memory loss, night sweats and weakness.  Hematological: Negative for swollen glands.  Psychiatric/Behavioral: Negative for depressed mood and sleep disturbance. The patient is not nervous/anxious.     PMFS History:  Patient Active Problem List   Diagnosis Date Noted  . Post-operative state 08/04/2018  . Complication of anesthesia   . Lumbar spondylosis 04/13/2018  . Chronic pain syndrome 03/08/2018  . Degeneration of lumbosacral intervertebral disc 11/28/2017  . Rheumatoid arthritis (HCC) 09/20/2017  . Language barrier 06/09/2017  . History of Pulmonary nodulosis  06/09/2017  . 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 High risk medication use 03/07/2017  . History of gastroesophageal reflux (GERD) 03/07/2017  . Kyphosis 03/07/2017  . Idiopathic scoliosis 03/07/2017  . Age-related osteoporosis without current pathological fracture 03/07/2017  . 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 Pulmonary nodules 03/07/2017  . Dermatitis 09/09/2016  . 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 Arthritis    RA, sees Dr.  09/28/2015  . Cataracts, bilateral    hx of  . Chronic back pain   . Complication of anesthesia   . GERD (gastroesophageal reflux disease)   . Headache(784.0)   . PONV (postoperative nausea and vomiting)     History reviewed. No pertinent family history. Past Surgical History:  Procedure Laterality Date  . EYE SURGERY  cataract bilateral  . FINGER ARTHRODESIS  06/06/2012   Procedure: ARTHRODESIS FINGER;  Surgeon: Roseanne Kaufman, MD;  Location: [place];  Service: Orthopedics;  Laterality: Right;  RIGHT SMALL FINGER AND  RING FINGER PIP FUSION WITH AUTOLOGOUS GRAFT FROM RIGHT WRIST  . FINGER ARTHROPLASTY Left 09/26/2015   Procedure: LEFT HAND METACARPOPHALANGEAL ARTHROPLASTY INDEX MIDDLE RING AND SMALL FINGERS WITH REALIGNMENT OF ;  Surgeon: Roseanne Kaufman, MD;  Location: [place];  Service: Orthopedics;  Laterality: Left;  . FINGER SURGERY Right 09/20/2017   screws in 4th and 5th digit  . FOOT SURGERY    . FRACTURE SURGERY     under right eye  . HAND SURGERY    . HARDWARE REMOVAL Right 09/20/2017   Procedure: Right hand hardware removal  ring and small fingers;  Surgeon: Roseanne Kaufman, MD;  Location: [place];  Service: Orthopedics;  Laterality: Right;  60 mins  . MULTIPLE EXTRACTIONS WITH ALVEOLOPLASTY N/A 08/04/2018   Procedure: MULTIPLE EXTRACTION;  Surgeon: Diona Browner, DDS;  Location: [place];  Service: Oral Surgery;  Laterality: N/A;  . PROXIMAL INTERPHALANGEAL FUSION (PIP) Left 04/22/2016   Procedure: PROXIMAL INTERPHALANGEAL FUSION (PIP) Index, middle, and ring;  Surgeon: Roseanne Kaufman, MD;  Location: [place];  Service: Orthopedics;  Laterality: Left;  . REPAIR EXTENSOR TENDON Left 09/26/2015   Procedure: EXTENSOR TENDON;  Surgeon: Roseanne Kaufman, MD;  Location: [place];  Service: Orthopedics;  Laterality: Left;   Social History   Social History Narrative  . Not on file   Immunization History  Administered Date(s) Administered  . Influenza,inj,quad, With Preservative 08/01/2017     Objective: Vital Signs: BP 108/72 (BP Location: Right Arm, Patient Position: Sitting, Cuff Size: Small)   Pulse 90   Resp 12   Ht 4\' 6"  (1.372 m)   Wt 81 lb 3.2 oz (36.8 kg)   BMI 19.58 kg/m    Physical Exam Vitals and nursing note reviewed.  Constitutional:      Appearance: She is well-developed.  HENT:     Head: Normocephalic and atraumatic.  Eyes:     Conjunctiva/sclera: Conjunctivae normal.  Cardiovascular:     Rate and Rhythm: Normal rate and regular rhythm.     Heart sounds: Normal heart sounds.    Pulmonary:     Effort: Pulmonary effort is normal.     Breath sounds: Normal breath sounds.  Abdominal:     General: Bowel sounds are normal.     Palpations: Abdomen is soft.  Musculoskeletal:     Cervical back: Normal range of motion.  Lymphadenopathy:     Cervical: No cervical adenopathy.  Skin:    General: Skin is warm and dry.     Capillary Refill: Capillary refill takes less than 2 seconds.     Comments: Ulceration noted over the left ankle  Neurological:     Mental Status: She is alert and oriented to person, place, and time.  Psychiatric:        Behavior: Behavior normal.      Musculoskeletal Exam: C-spine was in good range of motion.  She has thoracic kyphosis.  She has limited range of motion of bilateral shoulder joints.  Limited range of motion of bilateral elbow joints.  She has synovitis over bilateral MCPs and PIP joints as described below.  She has good range of motion of her knee joints.  Swelling over her left ankle joint.  CDAI Exam: CDAI Score: 37.6  Patient Global: 8 mm; Provider Global: 8 mm Swollen: 17 ;  Tender: 21  Joint Exam 01/31/2020      Right  Left  Glenohumeral   Tender   Tender  Elbow   Tender   Tender  MCP 2  Swollen Tender  Swollen Tender  MCP 3  Swollen Tender  Swollen Tender  MCP 4  Swollen Tender  Swollen Tender  MCP 5  Swollen Tender  Swollen Tender  PIP 2  Swollen Tender  Swollen Tender  PIP 3  Swollen Tender  Swollen Tender  PIP 4  Swollen Tender  Swollen Tender  PIP 5  Swollen Tender  Swollen Tender  Ankle     Swollen Tender     Investigation: No additional findings.  Imaging: VAS [place] ABI WITH/WO TBI  Result Date: 01/16/2020 LOWER EXTREMITY DOPPLER STUDY Indications: Leg pain.  Performing Technologist: Thereasa Parkin RVT  Examination Guidelines: A complete evaluation includes at minimum, Doppler waveform signals and systolic blood pressure reading at the level of bilateral brachial, anterior tibial, and posterior tibial  arteries, when vessel segments are accessible. Bilateral testing is considered an integral part of a complete examination. Photoelectric Plethysmograph (PPG) waveforms and toe systolic pressure readings are included as required and additional duplex testing as needed. Limited examinations for reoccurring indications may be performed as noted.  ABI Findings: +---------+------------------+-----+----------+--------+ Right    Rt Pressure (mmHg)IndexWaveform  Comment  +---------+------------------+-----+----------+--------+ Brachial 138                                       +---------+------------------+-----+----------+--------+ PTA      135               0.98 monophasic         +---------+------------------+-----+----------+--------+ DP       127               0.92 triphasic          +---------+------------------+-----+----------+--------+ Great Toe79                0.57                    +---------+------------------+-----+----------+--------+ +---------+------------------+-----+---------+-------+ Left     Lt Pressure (mmHg)IndexWaveform Comment +---------+------------------+-----+---------+-------+ Brachial 132                                     +---------+------------------+-----+---------+-------+ PTA      145               1.05 triphasic        +---------+------------------+-----+---------+-------+ DP       120               0.87 biphasic         +---------+------------------+-----+---------+-------+ Great Toe85                0.62                  +---------+------------------+-----+---------+-------+ +-------+-----------+-----------+------------+------------+ ABI/TBIToday's ABIToday's TBIPrevious ABIPrevious TBI +-------+-----------+-----------+------------+------------+ Right  0.98       0.57                                +-------+-----------+-----------+------------+------------+ Left   1.05       0.62                                 +-------+-----------+-----------+------------+------------+  No previous ABI.  Summary: Right: Resting right ankle-brachial index is within normal range. No evidence of significant right lower extremity arterial disease. The right toe-brachial index is abnormal. RT great toe pressure = 79 mmHg. Left: Resting left ankle-brachial index is within normal range. No evidence of significant left lower extremity arterial disease. The left toe-brachial index is abnormal. LT Great toe pressure = 85 mmHg.  *See table(s) above for measurements and observations.  Electronically signed by Waverly Ferrari MD on 01/16/2020 at 2:10:37 PM.   Final     Recent Labs: Lab Results  Component Value Date   WBC 4.9 06/27/2019   HGB 11.1 (L) 06/27/2019   PLT 205 06/27/2019   NA 139 06/27/2019   K 4.6 06/27/2019   CL 103 06/27/2019   CO2 26 06/27/2019   GLUCOSE 74 06/27/2019   BUN 16 06/27/2019   CREATININE 0.70 06/27/2019   BILITOT 0.7 11/16/2018   ALKPHOS 60 06/16/2017   AST 26 11/16/2018   ALT 10 11/16/2018   PROT 7.5 11/16/2018   ALBUMIN 4.3 06/16/2017   CALCIUM 9.9 06/27/2019   GFRAA >60 06/27/2019    Speciality Comments: No specialty comments available.  Procedures:  No procedures performed Allergies: Patient has no known allergies.   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  So far patient has been doing well.  She states she has not missed any medications.  She is having a severe flare involving multiple joints.  I will give her a prednisone taper starting at 20 mg p.o. daily and taper by 5 mg every 4 days.  I have also advised her to hold off methotrexate and sulfasalazine as she has an ulceration on her left ankle.  High risk medication use - Methotrexate 4 tablets by mouth once weekly, folic acid 1 mg po daily, sulfasalazine 500 mg 1 tablet by mouth twice daily.  She has not  had labs in a long time.  She will get labs today and then every 3 months.  Skin ulcer of left ankle, limited to breakdown of skin (HCC)-I have advised her to use topical Neosporin.  Have also called in a prescription for Bactrim DS, 1 tablet p.o. twice daily for 10 days.  I have advised her to follow-up with Dr. Donette Larry.  Age-related osteoporosis without current pathological fracture - thoracic kyphosis and scoliosis. She is on Fosamax 70 mg weekly prescribed by Dr. Donette Larry along with calcium/vitamin D supplement daily.  No DEXA on file.  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 CBC with Differential/Platelet  . COMPLETE METABOLIC PANEL WITH GFR  . Sedimentation rate   Meds ordered this encounter  Medications  . sulfamethoxazole-trimethoprim (BACTRIM DS) 800-160 MG tablet    Sig: Take 1 tablet by mouth 2 (two) times daily.    Dispense:  20 tablet    Refill:  0  . predniSONE (DELTASONE) 5 MG tablet    Sig: Take 4 tabs po qd x 4 days, 3  tabs po qd x 4 days, 2  tabs po qd x 4 days, 1  tab po qd x 4 days    Dispense:  40 tablet    Refill:  0    Face-to-face time spent with patient was 30 minutes. Greater than 50% of time was spent in counseling and coordination of care.  Follow-Up Instructions: Return in about 10 days (around 02/10/2020) for Rheumatoid arthritis, Osteoarthritis.   Pollyann Savoy, MD  Note - This record has been created using Animal nutritionist.  Chart creation errors have been sought, but may not always  have been located. Such creation errors do not reflect on  the standard of medical care.

## 2020-01-31 ENCOUNTER — Other Ambulatory Visit: Payer: Self-pay

## 2020-01-31 ENCOUNTER — Encounter: Payer: Self-pay | Admitting: Rheumatology

## 2020-01-31 ENCOUNTER — Ambulatory Visit (INDEPENDENT_AMBULATORY_CARE_PROVIDER_SITE_OTHER): Payer: Medicare Other | Admitting: Rheumatology

## 2020-01-31 VITALS — BP 108/72 | HR 90 | Resp 12 | Ht <= 58 in | Wt 81.2 lb

## 2020-01-31 DIAGNOSIS — R7612 Nonspecific reaction to cell mediated immunity measurement of gamma interferon antigen response without active tuberculosis: Secondary | ICD-10-CM | POA: Diagnosis not present

## 2020-01-31 DIAGNOSIS — M810 Age-related osteoporosis without current pathological fracture: Secondary | ICD-10-CM | POA: Diagnosis not present

## 2020-01-31 DIAGNOSIS — Z603 Acculturation difficulty: Secondary | ICD-10-CM

## 2020-01-31 DIAGNOSIS — M05742 Rheumatoid arthritis with rheumatoid factor of left hand without organ or systems involvement: Secondary | ICD-10-CM

## 2020-01-31 DIAGNOSIS — E559 Vitamin D deficiency, unspecified: Secondary | ICD-10-CM

## 2020-01-31 DIAGNOSIS — M05741 Rheumatoid arthritis with rheumatoid factor of right hand without organ or systems involvement: Secondary | ICD-10-CM

## 2020-01-31 DIAGNOSIS — Z79899 Other long term (current) drug therapy: Secondary | ICD-10-CM

## 2020-01-31 DIAGNOSIS — L97321 Non-pressure chronic ulcer of left ankle limited to breakdown of skin: Secondary | ICD-10-CM

## 2020-01-31 DIAGNOSIS — Z789 Other specified health status: Secondary | ICD-10-CM

## 2020-01-31 DIAGNOSIS — H35389 Toxic maculopathy, unspecified eye: Secondary | ICD-10-CM

## 2020-01-31 DIAGNOSIS — Z8719 Personal history of other diseases of the digestive system: Secondary | ICD-10-CM

## 2020-01-31 DIAGNOSIS — Z87898 Personal history of other specified conditions: Secondary | ICD-10-CM

## 2020-01-31 DIAGNOSIS — Z8739 Personal history of other diseases of the musculoskeletal system and connective tissue: Secondary | ICD-10-CM

## 2020-01-31 LAB — COMPLETE METABOLIC PANEL WITH GFR
AG Ratio: 1.2 (calc) (ref 1.0–2.5)
ALT: 12 U/L (ref 6–29)
AST: 31 U/L (ref 10–35)
Albumin: 4.1 g/dL (ref 3.6–5.1)
Alkaline phosphatase (APISO): 51 U/L (ref 37–153)
BUN: 20 mg/dL (ref 7–25)
CO2: 29 mmol/L (ref 20–32)
Calcium: 10.2 mg/dL (ref 8.6–10.4)
Chloride: 102 mmol/L (ref 98–110)
Creat: 0.74 mg/dL (ref 0.60–0.88)
GFR, Est African American: 89 mL/min/{1.73_m2} (ref >=60)
GFR, Est Non African American: 77 mL/min/{1.73_m2} (ref >=60)
Globulin: 3.4 g/dL (calc) (ref 1.9–3.7)
Glucose, Bld: 82 mg/dL (ref 65–99)
Potassium: 5 mmol/L (ref 3.5–5.3)
Sodium: 139 mmol/L (ref 135–146)
Total Bilirubin: 0.5 mg/dL (ref 0.2–1.2)
Total Protein: 7.5 g/dL (ref 6.1–8.1)

## 2020-01-31 LAB — CBC WITH DIFFERENTIAL/PLATELET
Absolute Monocytes: 765 cells/uL (ref 200–950)
Basophils Absolute: 22 cells/uL (ref 0–200)
Basophils Relative: 0.4 %
Eosinophils Absolute: 39 cells/uL (ref 15–500)
Eosinophils Relative: 0.7 %
HCT: 32.8 % — ABNORMAL LOW (ref 35.0–45.0)
Hemoglobin: 10.7 g/dL — ABNORMAL LOW (ref 11.7–15.5)
Lymphs Abs: 1397 cells/uL (ref 850–3900)
MCH: 30.8 pg (ref 27.0–33.0)
MCHC: 32.6 g/dL (ref 32.0–36.0)
MCV: 94.5 fL (ref 80.0–100.0)
MPV: 9.9 fL (ref 7.5–12.5)
Monocytes Relative: 13.9 %
Neutro Abs: 3278 cells/uL (ref 1500–7800)
Neutrophils Relative %: 59.6 %
Platelets: 229 10*3/uL (ref 140–400)
RBC: 3.47 10*6/uL — ABNORMAL LOW (ref 3.80–5.10)
RDW: 12.4 % (ref 11.0–15.0)
Total Lymphocyte: 25.4 %
WBC: 5.5 10*3/uL (ref 3.8–10.8)

## 2020-01-31 LAB — SEDIMENTATION RATE: Sed Rate: 75 mm/h — ABNORMAL HIGH (ref 0–30)

## 2020-01-31 MED ORDER — PREDNISONE 5 MG PO TABS: ORAL_TABLET | ORAL | 0 refills | Status: DC

## 2020-01-31 MED ORDER — SULFAMETHOXAZOLE-TRIMETHOPRIM 800-160 MG PO TABS: 1.0000 | ORAL_TABLET | Freq: Two times a day (BID) | ORAL | 0 refills | Status: DC

## 2020-01-31 NOTE — Patient Instructions (Addendum)
Hold methotrexate and sulfasalazine for next 2 weeks until you finish antibiotics.  Take prednisone as prescribed.  Take Bactrim DS 1 tablet twice a day for 10 days.   Please schedule an appointment with Dr. Donette Larry     Standing Labs We placed an order today for your standing lab work.    Please come back and get your standing labs in July and every 48months  We have open lab daily Monday through Thursday from 8:30-12:30 PM and 1:30-4:30 PM and Friday from 8:30-12:30 PM and 1:30-4:00 PM at the office of Dr. Pollyann Savoy.   You may experience shorter wait times on Monday and Friday afternoons. The office is located at [address], [address], [place], [place] [address] No appointment is necessary.   Labs are drawn by [facility].  You may receive a bill from [facility] for your lab work.  If you wish to have your labs drawn at another location, please call the office 24 hours in advance to send orders.  If you have any questions regarding directions or hours of operation,  please call [phone].   Just as a reminder please drink plenty of water prior to coming for your lab work. Thanks!

## 2020-02-04 NOTE — Progress Notes (Signed)
Office Visit Note  Patient: [name]             Date of Birth: [birth year]           MRN: [id number]             PCP: Georgann Housekeeper, MD Referring: Georgann Housekeeper, MD Visit Date: 02/08/2020 Occupation: @GUAROCC @  Subjective:  Follow up to check foot ulcer  History of Present Illness: [name] is a 79 y.o. female with history of seropositive rheumatoid arthritis.  She is currently holding MTX and SSZ due to being on Bactrim for treatment of an ulceration on her left foot.  Tomorrow is her last dose of Bactrim. She states the ulcer is healing well. She is currently on a prednisone taper.  She has ongoing pain and inflammation in both hands but has noticed improvement while taking prednisone.     Activities of Daily Living:  Patient reports morning stiffness for 0 minutes.   Patient Denies nocturnal pain.  Difficulty dressing/grooming: Reports  Difficulty climbing stairs: Reports Difficulty getting out of chair: Reports Difficulty using hands for taps, buttons, cutlery, and/or writing: Reports  Review of Systems  Constitutional: Positive for fatigue.  HENT: Negative for mouth sores, mouth dryness and nose dryness.   Eyes: Negative for pain, visual disturbance and dryness.  Respiratory: Negative for cough, hemoptysis, shortness of breath and difficulty breathing.   Cardiovascular: Negative for chest pain, palpitations, hypertension and swelling in legs/feet.  Gastrointestinal: Negative for blood in stool, constipation and diarrhea.  Endocrine: Negative for increased urination.  Genitourinary: Negative for painful urination.  Musculoskeletal: Positive for arthralgias, joint pain, joint swelling and morning stiffness. Negative for myalgias, muscle weakness, muscle tenderness and myalgias.  Skin: Negative for color change, pallor, rash, hair loss, nodules/bumps, skin tightness, ulcers and sensitivity to sunlight.  Allergic/Immunologic: Negative for susceptible to  infections.       +Ulcer on left foot  Neurological: Negative for dizziness, numbness, headaches and weakness.  Hematological: Negative for swollen glands.  Psychiatric/Behavioral: Negative for depressed mood and sleep disturbance. The patient is not nervous/anxious.     PMFS History:  Patient Active Problem List   Diagnosis Date Noted  . Post-operative state 08/04/2018  . Complication of anesthesia   . Lumbar spondylosis 04/13/2018  . Chronic pain syndrome 03/08/2018  . Degeneration of lumbosacral intervertebral disc 11/28/2017  . Rheumatoid arthritis (HCC) 09/20/2017  . Language barrier 06/09/2017  . History of Pulmonary nodulosis  06/09/2017  . 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 High risk medication use 03/07/2017  . History of gastroesophageal reflux (GERD) 03/07/2017  . Kyphosis 03/07/2017  . Idiopathic scoliosis 03/07/2017  . Age-related osteoporosis without current pathological fracture 03/07/2017  . 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 Pulmonary nodules 03/07/2017  . Dermatitis 09/09/2016  . 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 Arthritis    RA, sees Dr.  09/28/2015  . Cataracts, bilateral    hx of  . Chronic back pain   . Complication of anesthesia   . GERD (gastroesophageal reflux disease)   . Headache(784.0)   . PONV (postoperative nausea and vomiting)     History reviewed. No pertinent family history. Past Surgical History:  Procedure Laterality Date  . EYE SURGERY     cataract bilateral  . FINGER  ARTHRODESIS  06/06/2012   Procedure: ARTHRODESIS FINGER;  Surgeon: Roseanne Kaufman, MD;  Location: [place];  Service: Orthopedics;  Laterality: Right;  RIGHT SMALL FINGER AND RING FINGER PIP FUSION WITH AUTOLOGOUS GRAFT FROM RIGHT WRIST  . FINGER  ARTHROPLASTY Left 09/26/2015   Procedure: LEFT HAND METACARPOPHALANGEAL ARTHROPLASTY INDEX MIDDLE RING AND SMALL FINGERS WITH REALIGNMENT OF ;  Surgeon: Roseanne Kaufman, MD;  Location: [place];  Service: Orthopedics;  Laterality: Left;  . FINGER SURGERY Right 09/20/2017   screws in 4th and 5th digit  . FOOT SURGERY    . FRACTURE SURGERY     under right eye  . HAND SURGERY    . HARDWARE REMOVAL Right 09/20/2017   Procedure: Right hand hardware removal  ring and small fingers;  Surgeon: Roseanne Kaufman, MD;  Location: [place];  Service: Orthopedics;  Laterality: Right;  60 mins  . MULTIPLE EXTRACTIONS WITH ALVEOLOPLASTY N/A 08/04/2018   Procedure: MULTIPLE EXTRACTION;  Surgeon: Diona Browner, DDS;  Location: [place];  Service: Oral Surgery;  Laterality: N/A;  . PROXIMAL INTERPHALANGEAL FUSION (PIP) Left 04/22/2016   Procedure: PROXIMAL INTERPHALANGEAL FUSION (PIP) Index, middle, and ring;  Surgeon: Roseanne Kaufman, MD;  Location: [place];  Service: Orthopedics;  Laterality: Left;  . REPAIR EXTENSOR TENDON Left 09/26/2015   Procedure: EXTENSOR TENDON;  Surgeon: Roseanne Kaufman, MD;  Location: [place];  Service: Orthopedics;  Laterality: Left;   Social History   Social History Narrative  . Not on file   Immunization History  Administered Date(s) Administered  . Influenza,inj,quad, With Preservative 08/01/2017     Objective: Vital Signs: BP 123/72 (BP Location: Left Arm, Patient Position: Sitting, Cuff Size: Normal)   Pulse 91   Wt 77 lb (34.9 kg)   BMI 18.57 kg/m    Physical Exam Vitals and nursing note reviewed.  Constitutional:      Appearance: She is well-developed.  HENT:     Head: Normocephalic and atraumatic.  Eyes:     Conjunctiva/sclera: Conjunctivae normal.  Pulmonary:     Effort: Pulmonary effort is normal.  Abdominal:     General: Bowel sounds are normal.     Palpations: Abdomen is soft.  Musculoskeletal:     Cervical back: Normal range of motion.  Lymphadenopathy:      Cervical: No cervical adenopathy.  Skin:    General: Skin is warm and dry.     Capillary Refill: Capillary refill takes less than 2 seconds.  Neurological:     Mental Status: She is alert and oriented to person, place, and time.  Psychiatric:        Behavior: Behavior normal.      Musculoskeletal Exam: C-spine good ROM.  Severe thoracic kyphosis.  Limited ROM of both shoulder joints. Limited ROM of both elbow and wrist joints.  Synovitis and tenderness of bilateral 2nd and 5th MCP joints.  Knee joints good ROM with no discomfort.  No tenderness or swelling of ankle joints.   CDAI Exam: CDAI Score: -- Patient Global: --; Provider Global: -- Swollen: 4 ; Tender: 4  Joint Exam 02/08/2020      Right  Left  MCP 2  Swollen Tender  Swollen Tender  MCP 5  Swollen Tender  Swollen Tender     Investigation: No additional findings.  Imaging: VAS [place] ABI WITH/WO TBI  Result Date: 01/16/2020 LOWER EXTREMITY DOPPLER STUDY Indications: Leg pain.  Performing Technologist: Ralene Cork RVT  Examination Guidelines: A complete evaluation includes at minimum, Doppler waveform signals and systolic  blood pressure reading at the level of bilateral brachial, anterior tibial, and posterior tibial arteries, when vessel segments are accessible. Bilateral testing is considered an integral part of a complete examination. Photoelectric Plethysmograph (PPG) waveforms and toe systolic pressure readings are included as required and additional duplex testing as needed. Limited examinations for reoccurring indications may be performed as noted.  ABI Findings: +---------+------------------+-----+----------+--------+ Right    Rt Pressure (mmHg)IndexWaveform  Comment  +---------+------------------+-----+----------+--------+ Brachial 138                                       +---------+------------------+-----+----------+--------+ PTA      135               0.98 monophasic          +---------+------------------+-----+----------+--------+ DP       127               0.92 triphasic          +---------+------------------+-----+----------+--------+ Great Toe79                0.57                    +---------+------------------+-----+----------+--------+ +---------+------------------+-----+---------+-------+ Left     Lt Pressure (mmHg)IndexWaveform Comment +---------+------------------+-----+---------+-------+ Brachial 132                                     +---------+------------------+-----+---------+-------+ PTA      145               1.05 triphasic        +---------+------------------+-----+---------+-------+ DP       120               0.87 biphasic         +---------+------------------+-----+---------+-------+ Great Toe85                0.62                  +---------+------------------+-----+---------+-------+ +-------+-----------+-----------+------------+------------+ ABI/TBIToday's ABIToday's TBIPrevious ABIPrevious TBI +-------+-----------+-----------+------------+------------+ Right  0.98       0.57                                +-------+-----------+-----------+------------+------------+ Left   1.05       0.62                                +-------+-----------+-----------+------------+------------+ No previous ABI.  Summary: Right: Resting right ankle-brachial index is within normal range. No evidence of significant right lower extremity arterial disease. The right toe-brachial index is abnormal. RT great toe pressure = 79 mmHg. Left: Resting left ankle-brachial index is within normal range. No evidence of significant left lower extremity arterial disease. The left toe-brachial index is abnormal. LT Great toe pressure = 85 mmHg.  *See table(s) above for measurements and observations.  Electronically signed by Waverly Ferrari MD on 01/16/2020 at 2:10:37 PM.   Final     Recent Labs: Lab Results  Component Value Date    WBC 5.5 01/31/2020   HGB 10.7 (L) 01/31/2020   PLT 229 01/31/2020   NA 139 01/31/2020   K 5.0 01/31/2020   CL 102 01/31/2020   CO2  29 01/31/2020   GLUCOSE 82 01/31/2020   BUN 20 01/31/2020   CREATININE 0.74 01/31/2020   BILITOT 0.5 01/31/2020   ALKPHOS 60 06/16/2017   AST 31 01/31/2020   ALT 12 01/31/2020   PROT 7.5 01/31/2020   ALBUMIN 4.3 06/16/2017   CALCIUM 10.2 01/31/2020   GFRAA 89 01/31/2020    Speciality Comments: No specialty comments available.  Procedures:  No procedures performed Allergies: Patient has no known allergies.   Assessment / Plan:     Visit Diagnoses: Rheumatoid arthritis involving both hands with positive rheumatoid factor (HCC) - Severe end-stage rheumatoid arthritis: She has ongoing tenderness and synovitis of bilateral second and third MCP joints.  She has chronic pain and stiffness in both hands.  She has been holding methotrexate and sulfasalazine due to taking Bactrim 500 mg twice daily for the treatment of a skin ulcer on her left ankle.  She was prescribed a 10-day course of bactrim and her last dose of Bactrim will be tomorrow.  The skin ulcer on her left ankle is healing well.  She was advised to restart on methotrexate and sulfasalazine on Saturday.  She was in agreement.  She will continue on methotrexate 4 tablets by mouth once weekly and folic acid 1 mg by mouth daily, and sulfasalazine 500 mg 1 tablet twice daily.  She was advised to continue the prednisone taper as prescribed.  A future order for sed rate was placed today and will be obtained with her follow-up lab work.  She will follow-up in the office in 3 months.- Plan: Sedimentation rate  High risk medication use - Methotrexate 4 tablets by mouth once weekly, folic acid 1 mg po daily, sulfasalazine 500 mg 1 tablet by mouth twice daily.  Skin ulcer of left ankle, limited to breakdown of skin (HCC) - Healing well.  She has been taking Bactrim 500 mg twice daily as prescribed.  Her last dose  of Bactrim will be tomorrow.    Age-related osteoporosis without current pathological fracture - thoracic kyphosis and scoliosis. She is on Fosamax 70 mg weekly prescribed by Dr. Donette Larry along with calcium/vitamin D supplement daily.  No DEXA on file.  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 Sedimentation rate   No orders of the defined types were placed in this encounter.     Follow-Up Instructions: Return in about 3 months (around 05/09/2020) for Rheumatoid arthritis.   Gearldine Bienenstock, PA-C  Note - This record has been created using Dragon software.  Chart creation errors have been sought, but may not always  have been located. Such creation errors do not reflect on  the standard of medical care.

## 2020-02-04 NOTE — Progress Notes (Signed)
Anemia persists.  CMP is normal.  Sedimentation rate is high as patient was having a flare.

## 2020-02-05 ENCOUNTER — Encounter (INDEPENDENT_AMBULATORY_CARE_PROVIDER_SITE_OTHER): Payer: Medicare Other | Admitting: Ophthalmology

## 2020-02-05 ENCOUNTER — Other Ambulatory Visit: Payer: Self-pay

## 2020-02-05 DIAGNOSIS — H353132 Nonexudative age-related macular degeneration, bilateral, intermediate dry stage: Secondary | ICD-10-CM | POA: Diagnosis not present

## 2020-02-05 DIAGNOSIS — M069 Rheumatoid arthritis, unspecified: Secondary | ICD-10-CM

## 2020-02-05 DIAGNOSIS — H43813 Vitreous degeneration, bilateral: Secondary | ICD-10-CM

## 2020-02-07 ENCOUNTER — Other Ambulatory Visit: Payer: Self-pay | Admitting: Internal Medicine

## 2020-02-07 DIAGNOSIS — L98499 Non-pressure chronic ulcer of skin of other sites with unspecified severity: Secondary | ICD-10-CM

## 2020-02-08 ENCOUNTER — Ambulatory Visit (INDEPENDENT_AMBULATORY_CARE_PROVIDER_SITE_OTHER): Payer: Medicare Other | Admitting: Physician Assistant

## 2020-02-08 ENCOUNTER — Other Ambulatory Visit: Payer: Self-pay

## 2020-02-08 ENCOUNTER — Encounter: Payer: Self-pay | Admitting: Physician Assistant

## 2020-02-08 VITALS — BP 123/72 | HR 91 | Wt 77.0 lb

## 2020-02-08 DIAGNOSIS — M05741 Rheumatoid arthritis with rheumatoid factor of right hand without organ or systems involvement: Secondary | ICD-10-CM | POA: Diagnosis not present

## 2020-02-08 DIAGNOSIS — R7612 Nonspecific reaction to cell mediated immunity measurement of gamma interferon antigen response without active tuberculosis: Secondary | ICD-10-CM

## 2020-02-08 DIAGNOSIS — Z79899 Other long term (current) drug therapy: Secondary | ICD-10-CM | POA: Diagnosis not present

## 2020-02-08 DIAGNOSIS — L97321 Non-pressure chronic ulcer of left ankle limited to breakdown of skin: Secondary | ICD-10-CM

## 2020-02-08 DIAGNOSIS — H35389 Toxic maculopathy, unspecified eye: Secondary | ICD-10-CM

## 2020-02-08 DIAGNOSIS — Z789 Other specified health status: Secondary | ICD-10-CM

## 2020-02-08 DIAGNOSIS — Z8719 Personal history of other diseases of the digestive system: Secondary | ICD-10-CM

## 2020-02-08 DIAGNOSIS — M810 Age-related osteoporosis without current pathological fracture: Secondary | ICD-10-CM

## 2020-02-08 DIAGNOSIS — E559 Vitamin D deficiency, unspecified: Secondary | ICD-10-CM

## 2020-02-08 DIAGNOSIS — T372X5A Adverse effect of antimalarials and drugs acting on other blood protozoa, initial encounter: Secondary | ICD-10-CM

## 2020-02-08 DIAGNOSIS — Z87898 Personal history of other specified conditions: Secondary | ICD-10-CM

## 2020-02-08 DIAGNOSIS — M05742 Rheumatoid arthritis with rheumatoid factor of left hand without organ or systems involvement: Secondary | ICD-10-CM

## 2020-02-08 DIAGNOSIS — Z8739 Personal history of other diseases of the musculoskeletal system and connective tissue: Secondary | ICD-10-CM

## 2020-03-19 ENCOUNTER — Ambulatory Visit
Admission: RE | Admit: 2020-03-19 | Discharge: 2020-03-19 | Disposition: A | Discharge status: Home / Self Care | Payer: Medicare Other | Source: Ambulatory Visit | Attending: Internal Medicine | Admitting: Internal Medicine

## 2020-03-19 DIAGNOSIS — L98499 Non-pressure chronic ulcer of skin of other sites with unspecified severity: Secondary | ICD-10-CM

## 2020-03-20 ENCOUNTER — Other Ambulatory Visit: Payer: Self-pay | Admitting: Internal Medicine

## 2020-03-20 DIAGNOSIS — L98499 Non-pressure chronic ulcer of skin of other sites with unspecified severity: Secondary | ICD-10-CM

## 2020-04-21 NOTE — Progress Notes (Signed)
Office Visit Note  Patient: [name]             Date of Birth: Dec 09, 1940           MRN: [id number]             PCP: Wenda Low, MD Referring: Wenda Low, MD Visit Date: 04/30/2020 Occupation: @GUAROCC @  Subjective:  Pain and swelling in joints.   History of Present Illness: [name] is a 79 y.o. female with history of seropositive rheumatoid arthritis.  According to patient she has been going to the wound care center for a new wound on her left leg.  She has been also having discomfort in her right leg due to increased swelling.  She states she has discomfort and swelling in her bilateral hands.  She states she has been taking most of her medications on a regular basis.  Activities of Daily Living:  Patient reports morning stiffness for 24  hours.   Patient Reports nocturnal pain.  Difficulty dressing/grooming: Reports Difficulty climbing stairs: Reports Difficulty getting out of chair: Reports Difficulty using hands for taps, buttons, cutlery, and/or writing: Reports  Review of Systems  Constitutional: Positive for fatigue. Negative for night sweats, weight gain and weight loss.  HENT: Positive for mouth dryness. Negative for mouth sores, trouble swallowing, trouble swallowing and nose dryness.   Eyes: Negative for pain, redness, itching, visual disturbance and dryness.  Respiratory: Negative for cough, shortness of breath and difficulty breathing.   Cardiovascular: Negative for chest pain, palpitations, hypertension, irregular heartbeat and swelling in legs/feet.  Gastrointestinal: Positive for constipation. Negative for blood in stool and diarrhea.  Endocrine: Negative for increased urination.  Genitourinary: Negative for difficulty urinating and vaginal dryness.  Musculoskeletal: Positive for arthralgias, joint pain, joint swelling, myalgias, morning stiffness, muscle tenderness and myalgias. Negative for muscle weakness.  Skin: Negative for color  change, rash, hair loss, redness, skin tightness, ulcers and sensitivity to sunlight.  Allergic/Immunologic: Negative for susceptible to infections.  Neurological: Positive for dizziness, numbness, headaches, memory loss and weakness. Negative for night sweats.  Hematological: Negative for swollen glands.  Psychiatric/Behavioral: Positive for confusion. Negative for depressed mood and sleep disturbance. The patient is not nervous/anxious.     PMFS History:  Patient Active Problem List   Diagnosis Date Noted  . Post-operative state 08/04/2018  . Complication of anesthesia   . Lumbar spondylosis 04/13/2018  . Chronic pain syndrome 03/08/2018  . Degeneration of lumbosacral intervertebral disc 11/28/2017  . Rheumatoid arthritis ([place]) 09/20/2017  . Language barrier 06/09/2017  . History of Pulmonary nodulosis  06/09/2017  . 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 High risk medication use 03/07/2017  . History of gastroesophageal reflux (GERD) 03/07/2017  . Kyphosis 03/07/2017  . Idiopathic scoliosis 03/07/2017  . Age-related osteoporosis without current pathological fracture 03/07/2017  . 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 Pulmonary nodules 03/07/2017  . Dermatitis 09/09/2016  . Rheumatoid arthritis involving both hands with positive rheumatoid factor ([place]) 04/22/2016  . Seropositive rheumatoid arthritis of multiple sites ([place]) 09/26/2015    Past Medical History:  Diagnosis Date  . Arthritis    RA, sees Dr.  Nelva Bush  . Cataracts, bilateral    hx of  . Chronic back pain   . Complication of anesthesia   . GERD (gastroesophageal reflux disease)   . Headache(784.0)   . PONV (postoperative nausea and vomiting)     History reviewed. No pertinent family history. Past  Surgical History:  Procedure Laterality Date  . EYE SURGERY     cataract bilateral  . FINGER ARTHRODESIS  06/06/2012   Procedure: ARTHRODESIS FINGER;  Surgeon: Dominica Severin, MD;  Location: [facility];  Service: Orthopedics;  Laterality: Right;  RIGHT SMALL FINGER AND RING FINGER PIP FUSION WITH AUTOLOGOUS GRAFT FROM RIGHT WRIST  . FINGER ARTHROPLASTY Left 09/26/2015   Procedure: LEFT HAND METACARPOPHALANGEAL ARTHROPLASTY INDEX MIDDLE RING AND SMALL FINGERS WITH REALIGNMENT OF ;  Surgeon: Dominica Severin, MD;  Location: [facility];  Service: Orthopedics;  Laterality: Left;  . FINGER SURGERY Right 09/20/2017   screws in 4th and 5th digit  . FOOT SURGERY    . FRACTURE SURGERY     under right eye  . HAND SURGERY    . HARDWARE REMOVAL Right 09/20/2017   Procedure: Right hand hardware removal  ring and small fingers;  Surgeon: Dominica Severin, MD;  Location: [facility];  Service: Orthopedics;  Laterality: Right;  60 mins  . MULTIPLE EXTRACTIONS WITH ALVEOLOPLASTY N/A 08/04/2018   Procedure: MULTIPLE EXTRACTION;  Surgeon: Ocie Doyne, DDS;  Location: [facility] OR;  Service: Oral Surgery;  Laterality: N/A;  . PROXIMAL INTERPHALANGEAL FUSION (PIP) Left 04/22/2016   Procedure: PROXIMAL INTERPHALANGEAL FUSION (PIP) Index, middle, and ring;  Surgeon: Dominica Severin, MD;  Location: [facility];  Service: Orthopedics;  Laterality: Left;  . REPAIR EXTENSOR TENDON Left 09/26/2015   Procedure: EXTENSOR TENDON;  Surgeon: Dominica Severin, MD;  Location: [facility] OR;  Service: Orthopedics;  Laterality: Left;   Social History   Social History Narrative  . Not on file   Immunization History  Administered Date(s) Administered  . Influenza,inj,quad, With Preservative 08/01/2017     Objective: Vital Signs: BP 122/68 (BP Location: Right Arm, Patient Position: Sitting, Cuff Size: Normal)   Pulse 84   Resp 13    Physical Exam Vitals and nursing note reviewed.  Constitutional:      Appearance: She is well-developed.  HENT:     Head: Normocephalic and atraumatic.  Eyes:     Conjunctiva/sclera: Conjunctivae normal.  Cardiovascular:     Rate and Rhythm: Normal rate and regular rhythm.     Heart sounds:  Normal heart sounds.  Pulmonary:     Effort: Pulmonary effort is normal.     Breath sounds: Normal breath sounds.  Abdominal:     General: Bowel sounds are normal.     Palpations: Abdomen is soft.  Musculoskeletal:     Cervical back: Normal range of motion.     Right lower leg: Edema present.     Left lower leg: Edema present.  Lymphadenopathy:     Cervical: No cervical adenopathy.  Skin:    General: Skin is warm and dry.     Capillary Refill: Capillary refill takes less than 2 seconds.  Neurological:     Mental Status: She is alert and oriented to person, place, and time.  Psychiatric:        Behavior: Behavior normal.      Musculoskeletal Exam: She has limited range of motion of cervical spine.  She has severe thoracic kyphosis.  Shoulder joint: Good range of motion.  Elbow joints and wrist joints with good range of motion.  She has synovitis over MCPs and PIPs as described below.  Hip joints were difficult to assess.  Knee joints with good range of motion.  She had left leg wrapped in the gauze.  She had significant pedal edema in her right foot.  CDAI Exam: CDAI  Score: 17.2  Patient Global: 6 mm; Provider Global: 6 mm Swollen: 8 ; Tender: 8  Joint Exam 04/30/2020      Right  Left  MCP 2  Swollen Tender  Swollen Tender  MCP 3  Swollen Tender     MCP 4     Swollen Tender  PIP 2  Swollen Tender  Swollen Tender  PIP 3  Swollen Tender  Swollen Tender     Investigation: No additional findings.  Imaging: No results found.  Recent Labs: Lab Results  Component Value Date   WBC 5.5 01/31/2020   HGB 10.7 (L) 01/31/2020   PLT 229 01/31/2020   NA 139 01/31/2020   K 5.0 01/31/2020   CL 102 01/31/2020   CO2 29 01/31/2020   GLUCOSE 82 01/31/2020   BUN 20 01/31/2020   CREATININE 0.74 01/31/2020   BILITOT 0.5 01/31/2020   ALKPHOS 60 06/16/2017   AST 31 01/31/2020   ALT 12 01/31/2020   PROT 7.5 01/31/2020   ALBUMIN 4.3 06/16/2017   CALCIUM 10.2 01/31/2020   GFRAA 89  01/31/2020    Speciality Comments: No specialty comments available.  Procedures:  No procedures performed Allergies: Patient has no known allergies.   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  She is having a flare with increased pain and swelling in her joints.  She has a lot of edema in her right foot and is difficult to assess if she had any synovitis in her knee.  She has also stopped her DMARDs.  She states she has a wound on her left leg which is not healing.  Have advised her to discontinue methotrexate and sulfasalazine.  I have given her a prescription for prednisone 10 mg p.o. daily.  She will get clearance from the wound care center to restart DMARDs.  Side effects of prednisone were discussed.    High risk medication use - Methotrexate 4 tablets by mouth once weekly, folic acid 1 mg po daily, sulfasalazine 500 mg 1 tablet by mouth twice daily. - Plan: CBC with Differential/Platelet, COMPLETE METABOLIC PANEL WITH GFR  Skin ulcer of left ankle, limited to breakdown of skin (HCC)-she is going to the wound care center with poor healing per patient.  She is having problems with mobility.  Per her request handicap placard was also given.  Age-related osteoporosis without current pathological fracture - thoracic kyphosis and scoliosis. She is on Fosamax 70 mg weekly prescribed by Dr. Donette Larry.  I do not have any of her DEXA scans to review.  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 CBC with Differential/Platelet  . COMPLETE METABOLIC PANEL WITH GFR   Meds ordered this encounter  Medications  . predniSONE  (DELTASONE) 5 MG tablet    Sig: 2 tablets p.o. every morning    Dispense:  60 tablet    Refill:  1    .  Follow-Up Instructions: Return in about 6 weeks (around 06/11/2020) for Rheumatoid arthritis.   Pollyann Savoy, MD  Note - This record has been created using Animal nutritionist.  Chart creation errors have been sought, but may not always  have been located. Such creation errors do  not reflect on  the standard of medical care.

## 2020-04-30 ENCOUNTER — Other Ambulatory Visit: Payer: Self-pay

## 2020-04-30 ENCOUNTER — Ambulatory Visit (INDEPENDENT_AMBULATORY_CARE_PROVIDER_SITE_OTHER): Payer: Medicare Other | Admitting: Rheumatology

## 2020-04-30 ENCOUNTER — Encounter: Payer: Self-pay | Admitting: Rheumatology

## 2020-04-30 VITALS — BP 122/68 | HR 84 | Resp 13

## 2020-04-30 DIAGNOSIS — M05741 Rheumatoid arthritis with rheumatoid factor of right hand without organ or systems involvement: Secondary | ICD-10-CM

## 2020-04-30 DIAGNOSIS — T372X5A Adverse effect of antimalarials and drugs acting on other blood protozoa, initial encounter: Secondary | ICD-10-CM

## 2020-04-30 DIAGNOSIS — Z87898 Personal history of other specified conditions: Secondary | ICD-10-CM

## 2020-04-30 DIAGNOSIS — M05742 Rheumatoid arthritis with rheumatoid factor of left hand without organ or systems involvement: Secondary | ICD-10-CM

## 2020-04-30 DIAGNOSIS — E559 Vitamin D deficiency, unspecified: Secondary | ICD-10-CM

## 2020-04-30 DIAGNOSIS — Z8739 Personal history of other diseases of the musculoskeletal system and connective tissue: Secondary | ICD-10-CM

## 2020-04-30 DIAGNOSIS — H35389 Toxic maculopathy, unspecified eye: Secondary | ICD-10-CM

## 2020-04-30 DIAGNOSIS — L97321 Non-pressure chronic ulcer of left ankle limited to breakdown of skin: Secondary | ICD-10-CM

## 2020-04-30 DIAGNOSIS — Z79899 Other long term (current) drug therapy: Secondary | ICD-10-CM | POA: Diagnosis not present

## 2020-04-30 DIAGNOSIS — Z789 Other specified health status: Secondary | ICD-10-CM

## 2020-04-30 DIAGNOSIS — Z8719 Personal history of other diseases of the digestive system: Secondary | ICD-10-CM

## 2020-04-30 DIAGNOSIS — R7612 Nonspecific reaction to cell mediated immunity measurement of gamma interferon antigen response without active tuberculosis: Secondary | ICD-10-CM

## 2020-04-30 DIAGNOSIS — M810 Age-related osteoporosis without current pathological fracture: Secondary | ICD-10-CM

## 2020-04-30 MED ORDER — PREDNISONE 5 MG PO TABS: ORAL_TABLET | ORAL | 1 refills | Status: AC

## 2020-04-30 NOTE — Patient Instructions (Addendum)
  Stop methotrexate and sulfasalazine until the wound on your left leg is completely healed.  Please get a clearance from the wound care center to restart methotrexate and sulfasalazine after the wound heals.  Start prednisone 5 mg tablet, 2 tablets daily.  Please make an appointment with Dr. Donette Larry to discuss fluid retention in your feet.

## 2020-05-01 LAB — COMPLETE METABOLIC PANEL WITH GFR
AG Ratio: 1.2 (calc) (ref 1.0–2.5)
ALT: 10 U/L (ref 6–29)
AST: 26 U/L (ref 10–35)
Albumin: 4.4 g/dL (ref 3.6–5.1)
Alkaline phosphatase (APISO): 53 U/L (ref 37–153)
BUN: 15 mg/dL (ref 7–25)
CO2: 27 mmol/L (ref 20–32)
Calcium: 10.2 mg/dL (ref 8.6–10.4)
Chloride: 103 mmol/L (ref 98–110)
Creat: 0.77 mg/dL (ref 0.60–0.88)
GFR, Est African American: 84 mL/min/{1.73_m2} (ref >=60)
GFR, Est Non African American: 72 mL/min/{1.73_m2} (ref >=60)
Globulin: 3.6 g/dL (calc) (ref 1.9–3.7)
Glucose, Bld: 73 mg/dL (ref 65–99)
Potassium: 4.2 mmol/L (ref 3.5–5.3)
Sodium: 140 mmol/L (ref 135–146)
Total Bilirubin: 0.6 mg/dL (ref 0.2–1.2)
Total Protein: 8 g/dL (ref 6.1–8.1)

## 2020-05-01 LAB — CBC WITH DIFFERENTIAL/PLATELET
Absolute Monocytes: 744 cells/uL (ref 200–950)
Basophils Absolute: 18 cells/uL (ref 0–200)
Basophils Relative: 0.3 %
Eosinophils Absolute: 60 cells/uL (ref 15–500)
Eosinophils Relative: 1 %
HCT: 32.1 % — ABNORMAL LOW (ref 35.0–45.0)
Hemoglobin: 10.5 g/dL — ABNORMAL LOW (ref 11.7–15.5)
Lymphs Abs: 1380 cells/uL (ref 850–3900)
MCH: 30.9 pg (ref 27.0–33.0)
MCHC: 32.7 g/dL (ref 32.0–36.0)
MCV: 94.4 fL (ref 80.0–100.0)
MPV: 9.8 fL (ref 7.5–12.5)
Monocytes Relative: 12.4 %
Neutro Abs: 3798 cells/uL (ref 1500–7800)
Neutrophils Relative %: 63.3 %
Platelets: 273 10*3/uL (ref 140–400)
RBC: 3.4 10*6/uL — ABNORMAL LOW (ref 3.80–5.10)
RDW: 13.1 % (ref 11.0–15.0)
Total Lymphocyte: 23 %
WBC: 6 10*3/uL (ref 3.8–10.8)

## 2020-05-01 NOTE — Progress Notes (Signed)
Mild anemia persists.  CMP is normal.

## 2020-05-06 ENCOUNTER — Other Ambulatory Visit: Payer: Self-pay | Admitting: Internal Medicine

## 2020-05-06 ENCOUNTER — Ambulatory Visit
Admission: RE | Admit: 2020-05-06 | Discharge: 2020-05-06 | Disposition: A | Discharge status: Home / Self Care | Payer: Medicare Other | Source: Ambulatory Visit | Attending: Internal Medicine | Admitting: Internal Medicine

## 2020-05-06 DIAGNOSIS — L98499 Non-pressure chronic ulcer of skin of other sites with unspecified severity: Secondary | ICD-10-CM

## 2020-05-06 DIAGNOSIS — L97501 Non-pressure chronic ulcer of other part of unspecified foot limited to breakdown of skin: Secondary | ICD-10-CM

## 2020-05-07 ENCOUNTER — Ambulatory Visit: Payer: Medicare Other | Admitting: Rheumatology

## 2020-05-28 NOTE — Progress Notes (Addendum)
Office Visit Note  Patient: [name]             Date of Birth: 1941/09/24           MRN: [id number]             PCP: Georgann Housekeeper, MD Referring: Georgann Housekeeper, MD Visit Date: 06/11/2020 Occupation: @GUAROCC @  Subjective:  Other (joint pain and swelling )   History of Present Illness: [name] is a 79 y.o. female with history of rheumatoid arthritis.  She has been off sulfasalazine and methotrexate since the last visit due to infection.  She states she has been going to the wound care center now.  She has had total 7 visit so far.  She states the wound is gradually healing.  She also has an appointment coming up with Dr. Steele Berg.  She states she has been taking prednisone 5 mg a day.  She has some discomfort in her knee joint and ankle joint but not much swelling per patient.  Activities of Daily Living:  Patient reports morning stiffness for 24 hours.   Patient Reports nocturnal pain.  Difficulty dressing/grooming: Reports Difficulty climbing stairs: Reports Difficulty getting out of chair: Reports Difficulty using hands for taps, buttons, cutlery, and/or writing: Reports  Review of Systems  Constitutional: Negative for fatigue.  HENT: Positive for mouth sores and mouth dryness. Negative for nose dryness.   Eyes: Negative for itching and dryness.  Respiratory: Positive for difficulty breathing. Negative for shortness of breath.   Cardiovascular: Negative for chest pain and palpitations.  Gastrointestinal: Positive for constipation. Negative for blood in stool and diarrhea.  Endocrine: Negative for increased urination.  Genitourinary: Negative for difficulty urinating.  Musculoskeletal: Positive for arthralgias, joint pain, joint swelling, myalgias, morning stiffness, muscle tenderness and myalgias.  Skin: Positive for rash. Negative for color change.  Allergic/Immunologic: Positive for susceptible to infections.  Neurological: Positive for headaches.  Negative for dizziness, numbness, memory loss and weakness.  Hematological: Negative for bruising/bleeding tendency.  Psychiatric/Behavioral: Negative for confusion.    PMFS History:  Patient Active Problem List   Diagnosis Date Noted  . Post-operative state 08/04/2018  . Complication of anesthesia   . Lumbar spondylosis 04/13/2018  . Chronic pain syndrome 03/08/2018  . Degeneration of lumbosacral intervertebral disc 11/28/2017  . Rheumatoid arthritis (HCC) 09/20/2017  . Language barrier 06/09/2017  . History of Pulmonary nodulosis  06/09/2017  . 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 High risk medication use 03/07/2017  . History of gastroesophageal reflux (GERD) 03/07/2017  . Kyphosis 03/07/2017  . Idiopathic scoliosis 03/07/2017  . Age-related osteoporosis without current pathological fracture 03/07/2017  . 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 Pulmonary nodules 03/07/2017  . Dermatitis 09/09/2016  . 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 Arthritis    RA, sees Dr.  Ethelene Hal  . Cataracts, bilateral    hx of  . Chronic back pain   . Complication of anesthesia   . GERD (gastroesophageal reflux disease)   . Headache(784.0)   . PONV (postoperative nausea and vomiting)     History reviewed. No pertinent family history. Past Surgical History:  Procedure Laterality Date  . EYE SURGERY     cataract bilateral  . FINGER ARTHRODESIS  06/06/2012   Procedure: ARTHRODESIS FINGER;  Surgeon: Dominica Severin,  MD;  Location: [facility] OR;  Service: Orthopedics;  Laterality: Right;  RIGHT SMALL FINGER AND RING FINGER PIP FUSION WITH AUTOLOGOUS GRAFT FROM RIGHT WRIST  . FINGER ARTHROPLASTY Left 09/26/2015   Procedure: LEFT HAND METACARPOPHALANGEAL ARTHROPLASTY INDEX MIDDLE RING AND  SMALL FINGERS WITH REALIGNMENT OF ;  Surgeon: Dominica Severin, MD;  Location: [facility] OR;  Service: Orthopedics;  Laterality: Left;  . FINGER SURGERY Right 09/20/2017   screws in 4th and 5th digit  . FOOT SURGERY    . FRACTURE SURGERY     under right eye  . HAND SURGERY    . HARDWARE REMOVAL Right 09/20/2017   Procedure: Right hand hardware removal  ring and small fingers;  Surgeon: Dominica Severin, MD;  Location: [facility] OR;  Service: Orthopedics;  Laterality: Right;  60 mins  . MULTIPLE EXTRACTIONS WITH ALVEOLOPLASTY N/A 08/04/2018   Procedure: MULTIPLE EXTRACTION;  Surgeon: Ocie Doyne, DDS;  Location: [facility] OR;  Service: Oral Surgery;  Laterality: N/A;  . PROXIMAL INTERPHALANGEAL FUSION (PIP) Left 04/22/2016   Procedure: PROXIMAL INTERPHALANGEAL FUSION (PIP) Index, middle, and ring;  Surgeon: Dominica Severin, MD;  Location: [facility] OR;  Service: Orthopedics;  Laterality: Left;  . REPAIR EXTENSOR TENDON Left 09/26/2015   Procedure: EXTENSOR TENDON;  Surgeon: Dominica Severin, MD;  Location[facility];  Service: Orthopedics;  Laterality: Left;   Social History   Social History Narrative  . Not on file   Immunization History  Administered Date(s) Administered  . Influenza,inj,quad, With Preservative 08/01/2017  . PFIZER SARS-COV-2 Vaccination 04/22/2020, 05/13/2020     Objective: Vital Signs: BP 127/85 (BP Location: Left Arm, Patient Position: Sitting, Cuff Size: Normal)   Pulse 96   Resp 13   Ht 4\' 6"  (1.372 m)   Wt 77 lb 9.6 oz (35.2 kg)   BMI 18.71 kg/m    Physical Exam Vitals and nursing note reviewed.  Constitutional:      Appearance: She is well-developed.  HENT:     Head: Normocephalic and atraumatic.  Eyes:     Conjunctiva/sclera: Conjunctivae normal.  Cardiovascular:     Rate and Rhythm: Normal rate and regular rhythm.     Heart sounds: Normal heart sounds.  Pulmonary:     Effort: Pulmonary effort is normal.     Breath sounds: Normal breath sounds.  Abdominal:     General: Bowel  sounds are normal.     Palpations: Abdomen is soft.  Musculoskeletal:     Cervical back: Normal range of motion.  Lymphadenopathy:     Cervical: No cervical adenopathy.  Skin:    General: Skin is warm and dry.     Capillary Refill: Capillary refill takes less than 2 seconds.  Neurological:     Mental Status: She is alert and oriented to person, place, and time.  Psychiatric:        Behavior: Behavior normal.      Musculoskeletal Exam: She has good range of motion of her cervical spine.  She has severe thoracic kyphosis.  Shoulder joints and elbow joints with good range of motion.  She has good range of motion of her wrist joints.  She has noted range of motion in her PIPs and DIPs.  Knee joints with good range of motion.  Her left lower extremity is wrapped.  She had limited range of motion of bilateral ankle joints.  No synovitis was noted. CDAI Exam: CDAI Score: 0.8  Patient Global: 4 mm; Provider Global: 4 mm Swollen: 0 ; Tender: 2  Joint Exam  06/11/2020      Right  Left  Ankle   Tender     MTP 2   Tender      There is currently no information documented on the homunculus. Go to the Rheumatology activity and complete the homunculus joint exam.  Investigation: No additional findings.  Imaging: No results found.  Recent Labs: Lab Results  Component Value Date   WBC 6.0 04/30/2020   HGB 10.5 (L) 04/30/2020   PLT 273 04/30/2020   NA 140 04/30/2020   K 4.2 04/30/2020   CL 103 04/30/2020   CO2 27 04/30/2020   GLUCOSE 73 04/30/2020   BUN 15 04/30/2020   CREATININE 0.77 04/30/2020   BILITOT 0.6 04/30/2020   ALKPHOS 60 06/16/2017   AST 26 04/30/2020   ALT 10 04/30/2020   PROT 8.0 04/30/2020   ALBUMIN 4.3 06/16/2017   CALCIUM 10.2 04/30/2020   GFRAA 84 04/30/2020    Speciality Comments: PLQ- ocular toxicity  Procedures:  No procedures performed Allergies: Patient has no known allergies.   Assessment / Plan:     Visit Diagnoses: Rheumatoid arthritis involving  both hands with positive rheumatoid factor (HCC) - Severe end-stage rheumatoid arthritis.  She was advised her to discontinue methotrexate and sulfasalazine at the last visit..  I have given her a prescription for prednisone 10 mg p.o. daily.  She has no swelling on prednisone 10 mg.  Have advised her to decrease the dose if tolerated to 1-1/2 tablet and then to 1 tablet.  High risk medication use -(methotrexate 4 tablets by mouth once weekly, folic acid 1 mg po daily, sulfasalazine 500 mg 1 tablet by mouth twice daily.)-On hold due to infection.  I have advised patient to not to start these medications until she gets clearance from the wound care center and Dr. Donette Larry.  Skin ulcer of left ankle, limited to breakdown of skin (HCC)-she is going to wound care center.  Age-related osteoporosis without current pathological fracture - She is on Fosamax 70 mg weekly prescribed by Dr. Donette Larry.  I do not have any of her DEXA scans to review.  She will be a good candidate for Prolia injections.  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  Vitamin D deficiency  Language barrier  Orders: No orders of the defined types were placed in this encounter.  No orders of the defined types were placed in this encounter.    Follow-Up Instructions: Return in about 3 months (around 09/11/2020) for Rheumatoid arthritis.   Pollyann Savoy, MD  Note - This record has been created using Animal nutritionist.  Chart creation errors have been sought, but may not always  have been located. Such creation errors do not reflect on  the standard of medical care.

## 2020-06-11 ENCOUNTER — Ambulatory Visit (INDEPENDENT_AMBULATORY_CARE_PROVIDER_SITE_OTHER): Payer: Medicare Other | Admitting: Rheumatology

## 2020-06-11 ENCOUNTER — Other Ambulatory Visit: Payer: Self-pay

## 2020-06-11 ENCOUNTER — Encounter: Payer: Self-pay | Admitting: Rheumatology

## 2020-06-11 VITALS — BP 127/85 | HR 96 | Resp 13 | Ht <= 58 in | Wt 77.6 lb

## 2020-06-11 DIAGNOSIS — H35389 Toxic maculopathy, unspecified eye: Secondary | ICD-10-CM

## 2020-06-11 DIAGNOSIS — M05741 Rheumatoid arthritis with rheumatoid factor of right hand without organ or systems involvement: Secondary | ICD-10-CM

## 2020-06-11 DIAGNOSIS — Z87898 Personal history of other specified conditions: Secondary | ICD-10-CM

## 2020-06-11 DIAGNOSIS — T372X5A Adverse effect of antimalarials and drugs acting on other blood protozoa, initial encounter: Secondary | ICD-10-CM

## 2020-06-11 DIAGNOSIS — Z79899 Other long term (current) drug therapy: Secondary | ICD-10-CM

## 2020-06-11 DIAGNOSIS — Z789 Other specified health status: Secondary | ICD-10-CM

## 2020-06-11 DIAGNOSIS — R7612 Nonspecific reaction to cell mediated immunity measurement of gamma interferon antigen response without active tuberculosis: Secondary | ICD-10-CM

## 2020-06-11 DIAGNOSIS — E559 Vitamin D deficiency, unspecified: Secondary | ICD-10-CM

## 2020-06-11 DIAGNOSIS — M810 Age-related osteoporosis without current pathological fracture: Secondary | ICD-10-CM

## 2020-06-11 DIAGNOSIS — L97321 Non-pressure chronic ulcer of left ankle limited to breakdown of skin: Secondary | ICD-10-CM

## 2020-06-11 DIAGNOSIS — Z603 Acculturation difficulty: Secondary | ICD-10-CM

## 2020-06-11 DIAGNOSIS — Z8719 Personal history of other diseases of the digestive system: Secondary | ICD-10-CM

## 2020-06-11 DIAGNOSIS — Z8739 Personal history of other diseases of the musculoskeletal system and connective tissue: Secondary | ICD-10-CM

## 2020-06-11 DIAGNOSIS — M05742 Rheumatoid arthritis with rheumatoid factor of left hand without organ or systems involvement: Secondary | ICD-10-CM

## 2020-06-11 NOTE — Patient Instructions (Signed)
  Once you get clearance from the wound care center and the wound is completely healed then you may restart methotrexate and sulfasalazine.  Please continue to hold these medications until the infection is completely cleared     COVID-19 vaccine recommendations:   COVID-19 vaccine is recommended for everyone (unless you are allergic to a vaccine component), even if you are on a medication that suppresses your immune system.   If you are on Methotrexate, Cellcept (mycophenolate), Rinvoq, Harriette Ohara, and Olumiant- hold the medication for 1 week after each vaccine. Hold Methotrexate for 2 weeks after the single dose COVID-19 vaccine.   If you are on Orencia subcutaneous injection - hold medication one week prior to and one week after the first COVID-19 vaccine dose (only).   If you are on Orencia IV infusions- time vaccination administration so that the first COVID-19 vaccination will occur four weeks after the infusion and postpone the subsequent infusion by one week.   If you are on Cyclophosphamide or Rituxan infusions please contact your doctor prior to receiving the COVID-19 vaccine.   Do not take Tylenol or ant anti-inflammatory medications (NSAIDs) 24 hours prior to the COVID-19 vaccination.   There is no direct evidence about the efficacy of the COVID-19 vaccine in individuals who are on medications that suppress the immune system.   Even if you are fully vaccinated, and you are on any medications that suppress your immune system, please continue to wear a mask, maintain at least six feet social distance and practice hand hygiene.   If you develop a COVID-19 infection, please contact your PCP or our office to determine if you need antibody infusion.  We anticipate that a booster vaccine will be available soon for immunosuppressed individuals. Please cal our office before receiving your booster dose to make adjustments to your medication regimen.    [email or web address]

## 2020-06-18 ENCOUNTER — Ambulatory Visit: Payer: Medicare Other | Admitting: Rheumatology

## 2020-08-29 NOTE — Progress Notes (Deleted)
Office Visit Note  Patient: [name]             Date of Birth: 24-Jul-1941           MRN: [id number]             PCP: Georgann Housekeeper, MD Referring: Georgann Housekeeper, MD Visit Date: 09/11/2020 Occupation: @GUAROCC @  Subjective:  No chief complaint on file.   History of Present Illness: [name] is a 79 y.o. female ***   Activities of Daily Living:  Patient reports morning stiffness for *** {minute/hour:19697}.   Patient {ACTIONS;DENIES/REPORTS:21021675::"Denies"} nocturnal pain.  Difficulty dressing/grooming: {ACTIONS;DENIES/REPORTS:21021675::"Denies"} Difficulty climbing stairs: {ACTIONS;DENIES/REPORTS:21021675::"Denies"} Difficulty getting out of chair: {ACTIONS;DENIES/REPORTS:21021675::"Denies"} Difficulty using hands for taps, buttons, cutlery, and/or writing: {ACTIONS;DENIES/REPORTS:21021675::"Denies"}  No Rheumatology ROS completed.   PMFS History:  Patient Active Problem List   Diagnosis Date Noted  . Post-operative state 08/04/2018  . Complication of anesthesia   . Lumbar spondylosis 04/13/2018  . Chronic pain syndrome 03/08/2018  . Degeneration of lumbosacral intervertebral disc 11/28/2017  . Rheumatoid arthritis (HCC) 09/20/2017  . Language barrier 06/09/2017  . History of Pulmonary nodulosis  06/09/2017  . 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 High risk medication use 03/07/2017  . History of gastroesophageal reflux (GERD) 03/07/2017  . Kyphosis 03/07/2017  . Idiopathic scoliosis 03/07/2017  . Age-related osteoporosis without current pathological fracture 03/07/2017  . 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 Pulmonary nodules 03/07/2017  . Dermatitis 09/09/2016  . 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 Arthritis    RA, sees  Dr.  09/28/2015  . Cataracts, bilateral    hx of  . Chronic back pain   . Complication of anesthesia   . GERD (gastroesophageal reflux disease)   . Headache(784.0)   . PONV (postoperative nausea and vomiting)     No family history on file. Past Surgical History:  Procedure Laterality Date  . EYE SURGERY     cataract bilateral  . FINGER ARTHRODESIS  06/06/2012   Procedure: ARTHRODESIS FINGER;  Surgeon: 08/06/2012, MD;  Location: [facility] OR;  Service: Orthopedics;  Laterality: Right;  RIGHT SMALL FINGER AND RING FINGER PIP FUSION WITH AUTOLOGOUS GRAFT FROM RIGHT WRIST  . FINGER ARTHROPLASTY Left 09/26/2015   Procedure: LEFT HAND METACARPOPHALANGEAL ARTHROPLASTY INDEX MIDDLE RING AND SMALL FINGERS WITH REALIGNMENT OF ;  Surgeon: 09/28/2015, MD;  Location: [facility] OR;  Service: Orthopedics;  Laterality: Left;  . FINGER SURGERY Right 09/20/2017   screws in 4th and 5th digit  . FOOT SURGERY    . FRACTURE SURGERY     under right eye  . HAND SURGERY    . HARDWARE REMOVAL Right 09/20/2017   Procedure: Right hand hardware removal  ring and small fingers;  Surgeon: 09/22/2017, MD;  Location: [facility] OR;  Service: Orthopedics;  Laterality: Right;  60 mins  . MULTIPLE EXTRACTIONS WITH ALVEOLOPLASTY N/A 08/04/2018   Procedure: MULTIPLE EXTRACTION;  Surgeon: 10/04/2018, DDS;  Location: [facility] OR;  Service: Oral Surgery;  Laterality: N/A;  . PROXIMAL INTERPHALANGEAL FUSION (PIP) Left 04/22/2016   Procedure: PROXIMAL INTERPHALANGEAL FUSION (PIP) Index, middle, and ring;  Surgeon: 04/24/2016, MD;  Location: [facility] OR;  Service: Orthopedics;  Laterality: Left;  . REPAIR EXTENSOR TENDON Left 09/26/2015   Procedure: EXTENSOR TENDON;  Surgeon: 09/28/2015, MD;  Location: [facility] OR;  Service: Orthopedics;  Laterality: Left;   Social History   Social History Narrative  . Not on file   Immunization History  Administered Date(s) Administered  . Influenza,inj,quad, With Preservative 08/01/2017  . PFIZER SARS-COV-2  Vaccination 04/22/2020, 05/13/2020     Objective: Vital Signs: There were no vitals taken for this visit.   Physical Exam   Musculoskeletal Exam: ***  CDAI Exam: CDAI Score: -- Patient Global: --; Provider Global: -- Swollen: --; Tender: -- Joint Exam 09/11/2020   No joint exam has been documented for this visit   There is currently no information documented on the homunculus. Go to the Rheumatology activity and complete the homunculus joint exam.  Investigation: No additional findings.  Imaging: No results found.  Recent Labs: Lab Results  Component Value Date   WBC 6.0 04/30/2020   HGB 10.5 (L) 04/30/2020   PLT 273 04/30/2020   NA 140 04/30/2020   K 4.2 04/30/2020   CL 103 04/30/2020   CO2 27 04/30/2020   GLUCOSE 73 04/30/2020   BUN 15 04/30/2020   CREATININE 0.77 04/30/2020   BILITOT 0.6 04/30/2020   ALKPHOS 60 06/16/2017   AST 26 04/30/2020   ALT 10 04/30/2020   PROT 8.0 04/30/2020   ALBUMIN 4.3 06/16/2017   CALCIUM 10.2 04/30/2020   GFRAA 84 04/30/2020    Speciality Comments: PLQ- ocular toxicity  Procedures:  No procedures performed Allergies: Patient has no known allergies.   Assessment / Plan:     Visit Diagnoses: No diagnosis found.  Orders: No orders of the defined types were placed in this encounter.  No orders of the defined types were placed in this encounter.   Face-to-face time spent with patient was *** minutes. Greater than 50% of time was spent in counseling and coordination of care.  Follow-Up Instructions: No follow-ups on file.   Ellen Henri, CMA  Note - This record has been created using Animal nutritionist.  Chart creation errors have been sought, but may not always  have been located. Such creation errors do not reflect on  the standard of medical care.

## 2020-09-02 ENCOUNTER — Telehealth: Payer: Self-pay

## 2020-09-02 NOTE — Telephone Encounter (Signed)
Patient's son Henderson Baltimore left a voicemail yesterday 09/01/20 at 5:30 pm stating [facility] pharmacy refilled his mom's prescription of Prednisone and wants to make sure it is okay for her to take it.  Bhar requested a return call and states he is on central time.

## 2020-09-02 NOTE — Telephone Encounter (Signed)
Left message for patient's son [name] to contact the office.

## 2020-09-08 NOTE — Telephone Encounter (Signed)
Spoke with patient's son and advised per last office note prednisone 10 mg p.o. daily.  She has no swelling on prednisone 10 mg.  Have advised her to decrease the dose if tolerated to 1-1/2 tablet and then to 1 tablet. Advised that we will need clearance from the wound care doctor for her to restart on SSZ and MTX.

## 2020-09-11 ENCOUNTER — Ambulatory Visit: Payer: Medicare Other | Admitting: Rheumatology

## 2020-09-11 ENCOUNTER — Telehealth: Payer: Self-pay

## 2020-09-11 DIAGNOSIS — L97321 Non-pressure chronic ulcer of left ankle limited to breakdown of skin: Secondary | ICD-10-CM

## 2020-09-11 DIAGNOSIS — H35389 Toxic maculopathy, unspecified eye: Secondary | ICD-10-CM

## 2020-09-11 DIAGNOSIS — Z79899 Other long term (current) drug therapy: Secondary | ICD-10-CM

## 2020-09-11 DIAGNOSIS — Z8719 Personal history of other diseases of the digestive system: Secondary | ICD-10-CM

## 2020-09-11 DIAGNOSIS — Z8739 Personal history of other diseases of the musculoskeletal system and connective tissue: Secondary | ICD-10-CM

## 2020-09-11 DIAGNOSIS — M810 Age-related osteoporosis without current pathological fracture: Secondary | ICD-10-CM

## 2020-09-11 DIAGNOSIS — R7612 Nonspecific reaction to cell mediated immunity measurement of gamma interferon antigen response without active tuberculosis: Secondary | ICD-10-CM

## 2020-09-11 DIAGNOSIS — Z789 Other specified health status: Secondary | ICD-10-CM

## 2020-09-11 DIAGNOSIS — M05741 Rheumatoid arthritis with rheumatoid factor of right hand without organ or systems involvement: Secondary | ICD-10-CM

## 2020-09-11 DIAGNOSIS — Z87898 Personal history of other specified conditions: Secondary | ICD-10-CM

## 2020-09-11 DIAGNOSIS — E559 Vitamin D deficiency, unspecified: Secondary | ICD-10-CM

## 2020-09-11 NOTE — Telephone Encounter (Signed)
Patient's son Henderson Baltimore called to cancel patient's appointment.  Bhar states his mom doesn't live in [place] any more and hasn't found a new Rheumatologist yet.  Bhar states he has requested Dr. Levada Dy office send the clearance letter so his mom can restart her Prednisone.  Bhar states the office told him they have faxed it "multiple times."  Henderson Baltimore is requesting that Dr. Corliss Skains request the letter "maybe they will send it if a doctor requests it."   Their phone # is #[phone]  [facility].

## 2020-09-11 NOTE — Telephone Encounter (Signed)
We do not have any labs available since June.  I cannot refill her medications without lab work.  These medications require close monitoring of the labs.  I recommend that the patient should be seen by rheumatologist locally where she can get labs and prescriptions.

## 2020-09-11 NOTE — Telephone Encounter (Signed)
We have received letter from the wound care center stating that she is no longer receiving wound care and her last appointment was 07/29/2020. Can patient restart methotrexate 4 tablets by mouth once weekly, folic acid 1 mg po daily, sulfasalazine 500 mg 1 tablet by mouth twice daily?

## 2020-09-12 NOTE — Telephone Encounter (Signed)
Patient's son advised we do not have any labs available since June.  Advised Dr. Corliss Skains cannot refill her medications without lab work.  These medications require close monitoring of the labs.  Dr. Corliss Skains recommends that the patient should be seen by rheumatologist locally where she can get labs and prescriptions.

## 2020-10-04 IMAGING — CR RIGHT FOOT COMPLETE - 3+ VIEW
3 series · 3 of 3 positions shown · non-contrast
Comparison: Prior radiographs of the right foot 12/20/2018

CLINICAL DATA: 78-year-old female with right foot pain since
falling 5 days ago

EXAM:
RIGHT FOOT COMPLETE - 3+ VIEW

[x foot lat right]
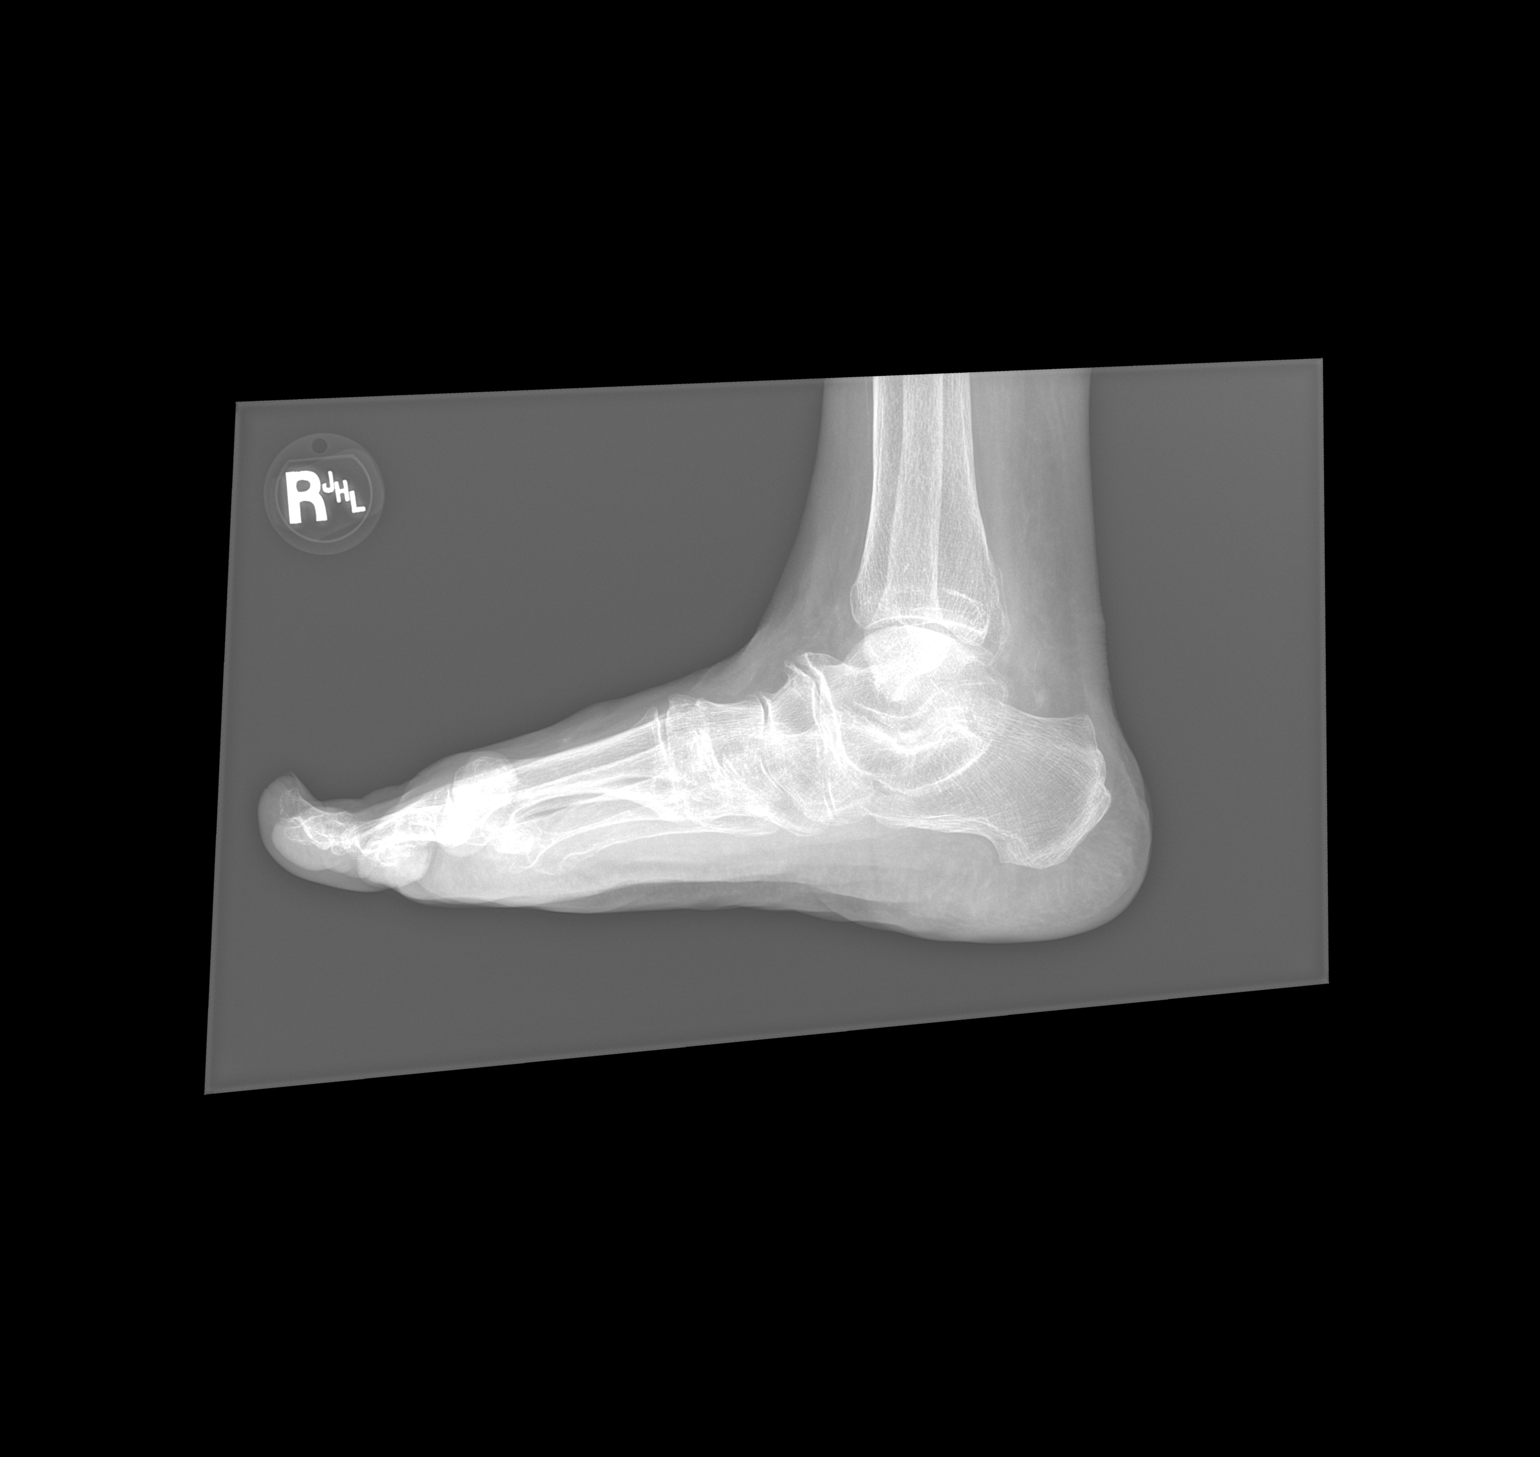

[x foot obl right]
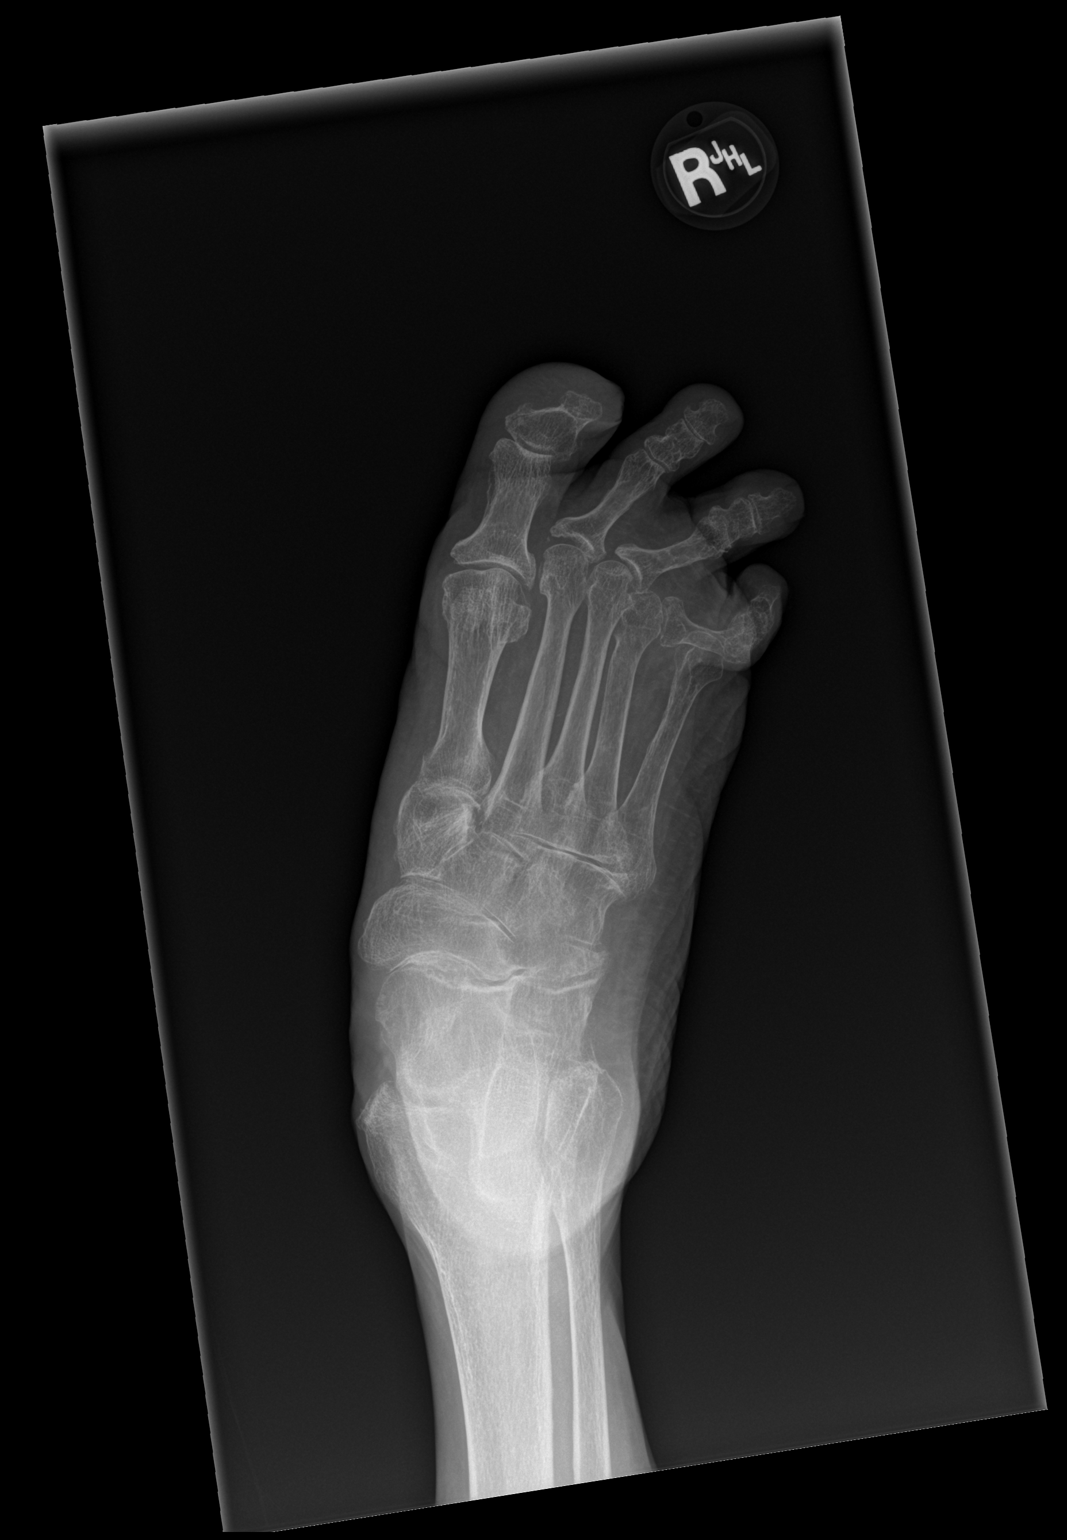

[x foot right 0-3yrs]
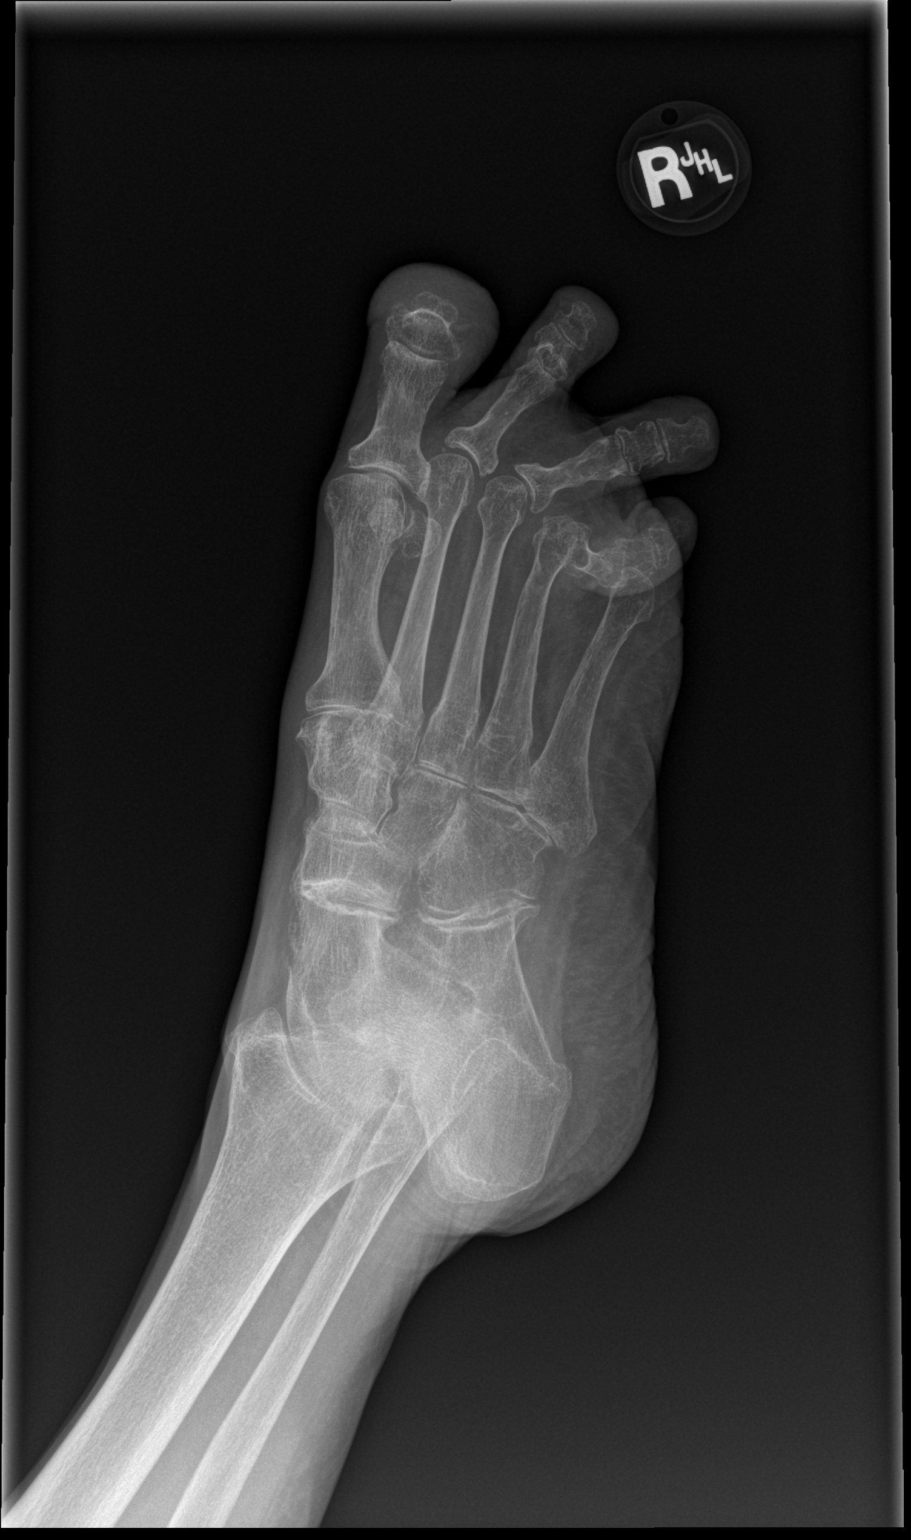

[3 of 3 positions shown; findings below may reference images not displayed]

FINDINGS: No evidence of acute fracture or malalignment. Stable appearance of
the digits with prior amputation of the fifth digit and complete
lateral dislocation of the fourth MTP joint. Lateral subluxation of
the second and third MTP joints also unchanged. There is midfoot
osteoarthritis primarily at the talonavicular joint. Degenerative
osteoarthritis also present at the great toe MTP joint. The bones
are diffusely osteopenic.
IMPRESSION: Stable radiographs of the right foot. No evidence of acute fracture
or malalignment.

The fifth digit is absent and there is chronic lateral
subluxation/dislocation of the second, third and fourth digits.

## 2021-02-05 ENCOUNTER — Encounter (INDEPENDENT_AMBULATORY_CARE_PROVIDER_SITE_OTHER): Payer: Medicare Other | Admitting: Ophthalmology
